# Patient Record
Sex: Male | Born: 1990 | Race: White | Hispanic: No | Marital: Single | Smoking: Former smoker
Health system: Southern US, Community
[De-identification: ages and names within clinical notes are randomized; demographics above are authoritative.]

## PROBLEM LIST (undated history)

## (undated) ENCOUNTER — Emergency Department (HOSPITAL_COMMUNITY): Admission: EM | Payer: 59 | Source: Home / Self Care

## (undated) ENCOUNTER — Emergency Department (HOSPITAL_COMMUNITY): Admission: EM | Payer: 59

## (undated) DIAGNOSIS — B958 Unspecified staphylococcus as the cause of diseases classified elsewhere: Secondary | ICD-10-CM

## (undated) DIAGNOSIS — I82409 Acute embolism and thrombosis of unspecified deep veins of unspecified lower extremity: Secondary | ICD-10-CM

## (undated) HISTORY — PX: MOUTH SURGERY: SHX715

## (undated) HISTORY — DX: Acute embolism and thrombosis of unspecified deep veins of unspecified lower extremity: I82.409

## (undated) HISTORY — PX: HAND SURGERY: SHX662

## (undated) HISTORY — PX: MR LOWER LEG LEFT (ARMC HX): HXRAD1784

## (undated) HISTORY — PX: FOOT AMPUTATION THROUGH ANKLE: SHX643

---
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
## 2012-11-16 ENCOUNTER — Other Ambulatory Visit: Payer: Self-pay

## 2012-11-16 ENCOUNTER — Emergency Department (HOSPITAL_COMMUNITY): Payer: 59

## 2012-11-16 ENCOUNTER — Encounter (HOSPITAL_COMMUNITY): Payer: Self-pay | Admitting: *Deleted

## 2012-11-16 ENCOUNTER — Emergency Department (HOSPITAL_COMMUNITY)
Admission: EM | Admit: 2012-11-16 | Discharge: 2012-11-16 | Disposition: A | Discharge status: Home / Self Care | Payer: 59 | Attending: Emergency Medicine | Admitting: Emergency Medicine

## 2012-11-16 DIAGNOSIS — R51 Headache: Secondary | ICD-10-CM | POA: Insufficient documentation

## 2012-11-16 DIAGNOSIS — Z8619 Personal history of other infectious and parasitic diseases: Secondary | ICD-10-CM | POA: Insufficient documentation

## 2012-11-16 DIAGNOSIS — R6883 Chills (without fever): Secondary | ICD-10-CM | POA: Insufficient documentation

## 2012-11-16 DIAGNOSIS — R109 Unspecified abdominal pain: Secondary | ICD-10-CM | POA: Insufficient documentation

## 2012-11-16 DIAGNOSIS — F17200 Nicotine dependence, unspecified, uncomplicated: Secondary | ICD-10-CM | POA: Insufficient documentation

## 2012-11-16 DIAGNOSIS — R079 Chest pain, unspecified: Secondary | ICD-10-CM | POA: Insufficient documentation

## 2012-11-16 HISTORY — DX: Unspecified staphylococcus as the cause of diseases classified elsewhere: B95.8

## 2012-11-16 NOTE — ED Notes (Addendum)
Pt c/o headache all day. Pt then says his chest started feeling funny (unable to explain feeling) around 8:30 pm. Pt states he had a staph infection 2 wks ago and that's when his headaches started. Pt also c/o left upper abdominal pain.

## 2012-11-16 NOTE — ED Notes (Signed)
Dr Juleen China in room assessing pt at this time

## 2012-11-16 NOTE — ED Notes (Signed)
Pt states he had a staph infection on his right arm, was given antibiotics but states he stopped taking them when the infection cleared and did not finish the whole coarse. Also notes headache and a "funny feeling in his chest" EKG done.

## 2012-11-16 NOTE — ED Notes (Signed)
Pt given a glass of water, informed pt we are waiting on chest x ray results.

## 2012-11-16 NOTE — ED Provider Notes (Signed)
History  This chart was scribed for Raeford Razor, MD by Shari Heritage, ED Scribe. The patient was seen in room APA04/APA04. Patient's care was started at 2200.   CSN: [id number]  Arrival date & time 11/16/12  2054   First MD Initiated Contact with Patient 11/16/12 2200      Chief Complaint  Patient presents with  . Chest Pain  . Headache     Patient is a 22 y.o. male presenting with headaches. The history is provided by the patient. No language interpreter was used.  Headache Pain location:  Generalized Quality:  Dull Radiates to:  Does not radiate Timing:  Unable to specify Progression:  Improving Relieved by:  Nothing Worsened by:  Nothing tried Ineffective treatments:  None tried Associated symptoms: abdominal pain   Associated symptoms: no diarrhea, no dizziness, no fever, no nausea, no numbness, no photophobia and no vomiting      HPI Comments: [name] is a 22 y.o. male who presents to the Emergency Department complaining of improving, constant, dull, diffuse headache since waking up this morning. Patient is also complaining of an unusual sensation in his chest. He states that his chest started to "feel funny" at about 8:30 pm, but he has difficulty characterizing and describing the sensation. There is associated chills. Patient denies fever or shortness of breath. There is no photophobia, numbness, weakness, nausea, visual changes or dizziness. Patient was diagnosed with a staph infection 2 weeks ago and says that he has been having headaches intermittently since then. He reports no other pertinent past medical history.  Past Medical History  Diagnosis Date  . Staph infection     Past Surgical History  Procedure Laterality Date  . Hand surgery      History reviewed. No pertinent family history.  History  Substance Use Topics  . Smoking status: Current Every Day Smoker  . Smokeless tobacco: Current User    Types: Chew  . Alcohol Use: No      Review  of Systems  Constitutional: Positive for chills. Negative for fever.  Eyes: Negative for photophobia and visual disturbance.  Respiratory: Negative for shortness of breath.   Gastrointestinal: Positive for abdominal pain. Negative for nausea, vomiting and diarrhea.  Neurological: Positive for headaches. Negative for dizziness, weakness and numbness.  All other systems reviewed and are negative.    Allergies  Review of patient's allergies indicates no known allergies.  Home Medications  No current outpatient prescriptions on file.  Triage Vitals: BP 125/72  Temp(Src) 98.1 F (36.7 C) (Oral)  Resp 20  Ht 6' (1.829 m)  Wt 180 lb (81.647 kg)  BMI 24.41 kg/m2  SpO2 100%  Physical Exam  Nursing note and vitals reviewed. Constitutional: He appears well-developed and well-nourished. No distress.  HENT:  Head: Normocephalic and atraumatic.  Eyes: Conjunctivae are normal. Right eye exhibits no discharge. Left eye exhibits no discharge.  Neck: Neck supple.  Cardiovascular: Normal rate, regular rhythm and normal heart sounds.  Exam reveals no gallop and no friction rub.   No murmur heard. Pulmonary/Chest: Effort normal and breath sounds normal. No respiratory distress.  Abdominal: Soft. He exhibits no distension. There is no tenderness.  Musculoskeletal: He exhibits no edema and no tenderness.  Lower extremities symmetric as compared to each other. No calf tenderness. Negative Homan's. No palpable cords.   Neurological: He is alert.  Skin: Skin is warm and dry.  Psychiatric: He has a normal mood and affect. His behavior is normal. Thought content normal.  ED Course  Procedures (including critical care time) DIAGNOSTIC STUDIES: Oxygen Saturation is 100% on room air, normal by my interpretation.    COORDINATION OF CARE: 10:25 PM- Patient informed of current plan for treatment and evaluation and agrees with plan at this time.     Labs Reviewed - No data to display No results  found.  Dg Chest 2 View  11/16/2012  *RADIOLOGY REPORT*  Clinical Data: Headache, chills, chest pain, abdominal pain, chest discomfort  CHEST - 2 VIEW  Comparison: None  Findings: Normal heart size, mediastinal contours, and pulmonary vascularity. Lungs clear. Bones unremarkable. No pneumothorax.  IMPRESSION: Normal exam.   Original Report Authenticated By: Ulyses Southward, M.D.    EKG:  Rhythm: normal sinus Vent. rate 66 BPM PR interval 176 ms QRS duration 108 ms QT/QTc 420/440 ms Non specific intraventricular delay ST segments: normal   1. Chest pain       MDM  21yM with CP. Doubt ACS, infectious, PE, dissection or other emergent pathology.       I personally preformed the services scribed in my presence. The recorded information has been reviewed is accurate. Raeford Razor, MD.    Raeford Razor, MD 11/20/12 1910

## 2012-12-02 ENCOUNTER — Encounter: Payer: Self-pay | Admitting: Family Medicine

## 2012-12-02 ENCOUNTER — Ambulatory Visit (INDEPENDENT_AMBULATORY_CARE_PROVIDER_SITE_OTHER): Payer: 59 | Admitting: Family Medicine

## 2012-12-02 VITALS — Temp 98.1°F | Wt 165.6 lb

## 2012-12-02 DIAGNOSIS — J309 Allergic rhinitis, unspecified: Secondary | ICD-10-CM

## 2012-12-02 MED ORDER — AMOXICILLIN-POT CLAVULANATE 875-125 MG PO TABS: 1.0000 | ORAL_TABLET | Freq: Two times a day (BID) | ORAL | Status: DC

## 2012-12-02 MED ORDER — LORATADINE 10 MG PO TABS: 10.0000 mg | ORAL_TABLET | Freq: Every day | ORAL | Status: DC

## 2012-12-02 MED ORDER — FLUTICASONE PROPIONATE 50 MCG/ACT NA SUSP: 2.0000 | Freq: Every day | NASAL | Status: DC

## 2012-12-02 NOTE — Patient Instructions (Signed)
Allergic Rhinitis  Allergic rhinitis is when the mucous membranes in the nose respond to allergens. Allergens are particles in the air that cause your body to have an allergic reaction. This causes you to release allergic antibodies. Through a chain of events, these eventually cause you to release histamine into the blood stream (hence the use of antihistamines). Although meant to be protective to the body, it is this release that causes your discomfort, such as frequent sneezing, congestion and an itchy runny nose.    CAUSES    The pollen allergens may come from grasses, trees, and weeds. This is seasonal allergic rhinitis, or "hay fever." Other allergens cause year-round allergic rhinitis (perennial allergic rhinitis) such as house dust mite allergen, pet dander and mold spores.    SYMPTOMS     Nasal stuffiness (congestion).   Runny, itchy nose with sneezing and tearing of the eyes.   There is often an itching of the mouth, eyes and ears.  It cannot be cured, but it can be controlled with medications.  DIAGNOSIS    If you are unable to determine the offending allergen, skin or blood testing may find it.  TREATMENT     Avoid the allergen.   Medications and allergy shots (immunotherapy) can help.   Hay fever may often be treated with antihistamines in pill or nasal spray forms. Antihistamines block the effects of histamine. There are over-the-counter medicines that may help with nasal congestion and swelling around the eyes. Check with your caregiver before taking or giving this medicine.  If the treatment above does not work, there are many new medications your caregiver can prescribe. Stronger medications may be used if initial measures are ineffective. Desensitizing injections can be used if medications and avoidance fails. Desensitization is when a patient is given ongoing shots until the body becomes less sensitive to the allergen. Make sure you follow up with your caregiver if problems continue.   SEEK MEDICAL CARE IF:     You develop fever (more than 100.5 F (38.1 C).   You develop a cough that does not stop easily (persistent).   You have shortness of breath.   You start wheezing.   Symptoms interfere with normal daily activities.  Document Released: 05/02/2001 Document Revised: 10/30/2011 Document Reviewed: 11/11/2008  [employer] Patient Information 2013 [employer].

## 2012-12-02 NOTE — Progress Notes (Signed)
  Subjective:    Patient ID: [name], male    DOB: Apr 02, 1991, 22 y.o.   MRN: [id number]  Sore Throat  This is a recurrent problem. The current episode started in the past 7 days. The problem has been gradually worsening. Neither side of throat is experiencing more pain than the other. There has been no fever. The fever has been present for less than 1 day. The pain is at a severity of 2/10. The patient is experiencing no pain. Associated symptoms include congestion, coughing and a hoarse voice. Pertinent negatives include no abdominal pain, ear pain, headaches, neck pain, shortness of breath or stridor.      Review of Systems  Constitutional: Positive for fatigue.  HENT: Positive for congestion and hoarse voice. Negative for ear pain and neck pain.   Respiratory: Positive for cough. Negative for shortness of breath and stridor.   Cardiovascular: Negative for chest pain.  Gastrointestinal: Negative for abdominal pain.  Neurological: Negative for headaches.       Objective:   Physical Exam  Constitutional: He appears well-developed and well-nourished.  HENT:  Head: Normocephalic and atraumatic.  Right Ear: External ear normal.  Left Ear: External ear normal.  Neck: Normal range of motion. Neck supple. No thyromegaly present.  Cardiovascular: Normal rate, regular rhythm and normal heart sounds.   No murmur heard. Pulmonary/Chest: Effort normal and breath sounds normal. No respiratory distress. He has no wheezes.  Abdominal: Soft.  Lymphadenopathy:    He has no cervical adenopathy.          Assessment & Plan:  Allergic rhinitis he has been scribed loratadine one every day also Flonase 2 sprays each nostril daily for the next several months. He will followup here when necessary. I also prescribed Augmentin 875 one twice a day for 10 days for sinus infection. He is to followup if progressive troubles.

## 2012-12-30 ENCOUNTER — Ambulatory Visit: Payer: 59 | Admitting: Family Medicine

## 2013-01-09 ENCOUNTER — Telehealth: Payer: Self-pay | Admitting: Family Medicine

## 2013-01-09 NOTE — Telephone Encounter (Signed)
PT has been several days without a BM, feeling really constipated. Can we call in something or what would you recommend? [facility] but verify with Marcelino Duster the contact who called first.

## 2013-01-09 NOTE — Telephone Encounter (Signed)
Left message on pt mother vm to use miralax 1 capful with 8oz use now then again in 2 hrs, repeat 2 hrs later again.  Use with senokot tablet, call in the am for app.

## 2013-01-17 ENCOUNTER — Encounter: Payer: Self-pay | Admitting: *Deleted

## 2013-01-22 ENCOUNTER — Encounter: Payer: Self-pay | Admitting: *Deleted

## 2013-01-28 ENCOUNTER — Ambulatory Visit (INDEPENDENT_AMBULATORY_CARE_PROVIDER_SITE_OTHER): Payer: 59 | Admitting: Family Medicine

## 2013-01-28 ENCOUNTER — Encounter: Payer: Self-pay | Admitting: Family Medicine

## 2013-01-28 VITALS — BP 120/88 | Temp 98.2°F | Wt 159.8 lb

## 2013-01-28 DIAGNOSIS — R109 Unspecified abdominal pain: Secondary | ICD-10-CM

## 2013-01-28 NOTE — Progress Notes (Signed)
  Subjective:    Patient ID: [name], male    DOB: Jan 08, 1991, 22 y.o.   MRN: [id number]  Flank Pain This is a new problem. The current episode started more than 1 month ago. The problem occurs every several days. The problem is unchanged. The patient is experiencing no pain. He has tried nothing for the symptoms.      Review of Systems  Genitourinary: Positive for flank pain.  Patient relates a the pain discomfort is in the anterior aspect of the abdomen on the right side had been off and on for several days now doing much better he denies any fever sweats chills vomiting diarrhea states when he eats he does not getting problems does not hurt with any particular type of movement never had this before. Family history benign. Social he does use tobacco to chew but does not smoke he's been counseled to quit     Objective:   Physical Exam  Neck no masses lungs are clear no crackles heart is regular no murmurs abdomen soft no guarding rebound tenderness skin warm dry      Assessment & Plan:  Abdominal pain-I. Don't find any evidence of any type of ongoing trouble about recommend x-rays or lab work currently if he has reoccurrence may try to omeprazole for a two-week trial I did tell him if he starts having severe vomiting fevers chills or other problems he needs followup 15 minutes spent with patient. [id number]. I do not feel that this patient is having any type of gallbladder disease or kidney stones.

## 2013-01-28 NOTE — Patient Instructions (Addendum)
If pain returns then try omeprazole 20 mg one daily for 2 weeks.   If worse- fevers, vomiting or other issues call    Tylenol for rib pain when needed but no greater than 4 per day.

## 2013-02-05 ENCOUNTER — Encounter: Payer: Self-pay | Admitting: Family Medicine

## 2013-02-05 ENCOUNTER — Ambulatory Visit (INDEPENDENT_AMBULATORY_CARE_PROVIDER_SITE_OTHER): Payer: 59 | Admitting: Family Medicine

## 2013-02-05 VITALS — Temp 98.2°F | Wt 159.4 lb

## 2013-02-05 DIAGNOSIS — R1013 Epigastric pain: Secondary | ICD-10-CM

## 2013-02-05 DIAGNOSIS — R109 Unspecified abdominal pain: Secondary | ICD-10-CM | POA: Insufficient documentation

## 2013-02-05 LAB — CBC WITH DIFFERENTIAL/PLATELET
Basophils Absolute: 0 10*3/uL (ref 0.0–0.1)
Basophils Relative: 1 % (ref 0–1)
Eosinophils Absolute: 0.2 10*3/uL (ref 0.0–0.7)
Eosinophils Relative: 3 % (ref 0–5)
HCT: 45.5 % (ref 39.0–52.0)
Hemoglobin: 15.6 g/dL (ref 13.0–17.0)
Lymphocytes Relative: 34 % (ref 12–46)
Lymphs Abs: 2.2 10*3/uL (ref 0.7–4.0)
MCH: 29.7 pg (ref 26.0–34.0)
MCHC: 34.3 g/dL (ref 30.0–36.0)
MCV: 86.7 fL (ref 78.0–100.0)
Monocytes Absolute: 0.4 10*3/uL (ref 0.1–1.0)
Monocytes Relative: 5 % (ref 3–12)
Neutro Abs: 3.8 10*3/uL (ref 1.7–7.7)
Neutrophils Relative %: 57 % (ref 43–77)
Platelets: 175 10*3/uL (ref 150–400)
RBC: 5.25 MIL/uL (ref 4.22–5.81)
RDW: 13.4 % (ref 11.5–15.5)
WBC: 6.6 10*3/uL (ref 4.0–10.5)

## 2013-02-05 MED ORDER — PANTOPRAZOLE SODIUM 40 MG PO TBEC: 40.0000 mg | DELAYED_RELEASE_TABLET | Freq: Every day | ORAL | Status: DC

## 2013-02-05 NOTE — Patient Instructions (Addendum)
  Use protonix one daily for next 3 months, may use Maalox as needed Do your labs, we will let you know results Recheck here in 3 weeks,if not better over next 10 days call The next step would be a abdominal ultrasound and GI referral    Gastritis, Adult Gastritis is soreness and swelling (inflammation) of the lining of the stomach. Gastritis can develop as a sudden onset (acute) or long-term (chronic) condition. If gastritis is not treated, it can lead to stomach bleeding and ulcers. CAUSES  Gastritis occurs when the stomach lining is weak or damaged. Digestive juices from the stomach then inflame the weakened stomach lining. The stomach lining may be weak or damaged due to viral or bacterial infections. One common bacterial infection is the Helicobacter pylori infection. Gastritis can also result from excessive alcohol consumption, taking certain medicines, or having too much acid in the stomach.  SYMPTOMS  In some cases, there are no symptoms. When symptoms are present, they may include:  Pain or a burning sensation in the upper abdomen.  Nausea.  Vomiting.  An uncomfortable feeling of fullness after eating. DIAGNOSIS  Your caregiver may suspect you have gastritis based on your symptoms and a physical exam. To determine the cause of your gastritis, your caregiver may perform the following:  Blood or stool tests to check for the H pylori bacterium.  Gastroscopy. A thin, flexible tube (endoscope) is passed down the esophagus and into the stomach. The endoscope has a light and camera on the end. Your caregiver uses the endoscope to view the inside of the stomach.  Taking a tissue sample (biopsy) from the stomach to examine under a microscope. TREATMENT  Depending on the cause of your gastritis, medicines may be prescribed. If you have a bacterial infection, such as an H pylori infection, antibiotics may be given. If your gastritis is caused by too much acid in the stomach, H2 blockers or  antacids may be given. Your caregiver may recommend that you stop taking aspirin, ibuprofen, or other nonsteroidal anti-inflammatory drugs (NSAIDs). HOME CARE INSTRUCTIONS  Only take over-the-counter or prescription medicines as directed by your caregiver.  If you were given antibiotic medicines, take them as directed. Finish them even if you start to feel better.  Drink enough fluids to keep your urine clear or pale yellow.  Avoid foods and drinks that make your symptoms worse, such as:  Caffeine or alcoholic drinks.  Chocolate.  Peppermint or mint flavorings.  Garlic and onions.  Spicy foods.  Citrus fruits, such as oranges, lemons, or limes.  Tomato-based foods such as sauce, chili, salsa, and pizza.  Fried and fatty foods.  Eat small, frequent meals instead of large meals. SEEK IMMEDIATE MEDICAL CARE IF:   You have black or dark red stools.  You vomit blood or material that looks like coffee grounds.  You are unable to keep fluids down.  Your abdominal pain gets worse.  You have a fever.  You do not feel better after 1 week.  You have any other questions or concerns. MAKE SURE YOU:  Understand these instructions.  Will watch your condition.  Will get help right away if you are not doing well or get worse. Document Released: 08/01/2001 Document Revised: 02/06/2012 Document Reviewed: 09/20/2011 [facility] Patient Information 2014 [place], [place].

## 2013-02-05 NOTE — Progress Notes (Signed)
  Subjective:    Patient ID: [name], male    DOB: 1991-01-28, 22 y.o.   MRN: [id number]  Abdominal Pain This is a recurrent problem. The current episode started more than 1 month ago. The problem has been unchanged.   Was hurting on r side now both sides. Trying to eat healthy No vomiting No diarrhea. No prior histoiry other than the past month   Review of Systems  Gastrointestinal: Positive for abdominal pain.  no sweats, felt hot with the pain, happens every other day, woke pt last night.  Family history noncontributory social-doesn't smoke    Objective:   Physical Exam  Lungs clear hearts regular abdomen soft no guarding or rebound he points to the epigastric area where he had the pain and discomfort. Flanks nontender extremities no edema skin warm dry neurologic grossly      Assessment & Plan:  Abdominal pain-lab work ordered protonic daily if not doing better over the next 10-14 days recommend ultrasound and GI referral. Followup in approximately one month sooner if any problems.

## 2013-02-06 ENCOUNTER — Telehealth: Payer: Self-pay | Admitting: Family Medicine

## 2013-02-06 LAB — HEPATIC FUNCTION PANEL
ALT: <8 U/L (ref 0–53)
AST: 12 U/L (ref 0–37)
Albumin: 4.6 g/dL (ref 3.5–5.2)
Alkaline Phosphatase: 58 U/L (ref 39–117)
Bilirubin, Direct: 0.2 mg/dL (ref 0.0–0.3)
Indirect Bilirubin: 0.5 mg/dL (ref 0.0–0.9)
Total Bilirubin: 0.7 mg/dL (ref 0.3–1.2)
Total Protein: 7.2 g/dL (ref 6.0–8.3)

## 2013-02-06 LAB — BASIC METABOLIC PANEL
BUN: 12 mg/dL (ref 6–23)
CO2: 26 mEq/L (ref 19–32)
Calcium: 9.3 mg/dL (ref 8.4–10.5)
Chloride: 102 mEq/L (ref 96–112)
Creat: 1.09 mg/dL (ref 0.50–1.35)
Glucose, Bld: 90 mg/dL (ref 70–99)
Potassium: 4.3 mEq/L (ref 3.5–5.3)
Sodium: 138 mEq/L (ref 135–145)

## 2013-02-06 LAB — LIPASE: Lipase: 14 U/L (ref 0–75)

## 2013-02-06 LAB — H. PYLORI ANTIBODY, IGG: H Pylori IgG: 0.57 {ISR}

## 2013-02-06 NOTE — Telephone Encounter (Signed)
Patient is calling to check on BW results

## 2013-02-07 NOTE — Telephone Encounter (Signed)
See results note. 

## 2013-02-07 NOTE — Telephone Encounter (Signed)
Patient was notified of results.  

## 2013-07-11 ENCOUNTER — Ambulatory Visit (INDEPENDENT_AMBULATORY_CARE_PROVIDER_SITE_OTHER): Payer: 59 | Admitting: *Deleted

## 2013-07-11 DIAGNOSIS — Z23 Encounter for immunization: Secondary | ICD-10-CM

## 2013-09-09 ENCOUNTER — Encounter: Payer: Self-pay | Admitting: Family Medicine

## 2013-09-09 ENCOUNTER — Ambulatory Visit (INDEPENDENT_AMBULATORY_CARE_PROVIDER_SITE_OTHER): Payer: 59 | Admitting: Family Medicine

## 2013-09-09 VITALS — BP 120/80 | Temp 98.2°F | Ht 72.0 in | Wt 157.5 lb

## 2013-09-09 DIAGNOSIS — J029 Acute pharyngitis, unspecified: Secondary | ICD-10-CM

## 2013-09-09 LAB — POCT RAPID STREP A (OFFICE): Rapid Strep A Screen: POSITIVE — AB

## 2013-09-09 MED ORDER — AMOXICILLIN 500 MG PO TABS: 500.0000 mg | ORAL_TABLET | Freq: Three times a day (TID) | ORAL | Status: DC

## 2013-09-09 MED ORDER — MUPIROCIN 2 % EX OINT: TOPICAL_OINTMENT | CUTANEOUS | Status: AC

## 2013-09-09 NOTE — Progress Notes (Signed)
   Subjective:    Patient ID: Verna CzechHarold A Patchell, male    DOB: 09/29/1990, 23 y.o.   MRN: [id number]  Sore Throat  This is a new problem. The current episode started yesterday. The problem has been unchanged. Neither side of throat is experiencing more pain than the other. There has been no fever. The pain is moderate. Pertinent negatives include no congestion, coughing or ear pain. Associated symptoms comments: nausea. Treatments tried: dayquil. The treatment provided moderate relief.   Started yesterday, worse last night, this am still present. Felt nausea today, did not eat, ate lunch but had nausea.  PMH benign  Review of Systems  Constitutional: Negative for fever and activity change.  HENT: Positive for sore throat. Negative for congestion, ear pain and rhinorrhea.   Eyes: Negative for discharge.  Respiratory: Negative for cough and wheezing.   Cardiovascular: Negative for chest pain.       Objective:   Physical Exam  Nursing note and vitals reviewed. Constitutional: He appears well-developed.  HENT:  Head: Normocephalic.  Mouth/Throat: Oropharynx is clear and moist. No oropharyngeal exudate.  Neck: Normal range of motion.  Cardiovascular: Normal rate, regular rhythm and normal heart sounds.   No murmur heard. Pulmonary/Chest: Effort normal and breath sounds normal. He has no wheezes.  Lymphadenopathy:    He has no cervical adenopathy.  Neurological: He exhibits normal muscle tone.  Skin: Skin is warm and dry.          Assessment & Plan:  Strep throat amoxicillin prescribed warning signs discussed followup if progressive troubles

## 2014-03-17 ENCOUNTER — Ambulatory Visit (INDEPENDENT_AMBULATORY_CARE_PROVIDER_SITE_OTHER): Payer: 59 | Admitting: Family Medicine

## 2014-03-17 ENCOUNTER — Encounter: Payer: Self-pay | Admitting: Family Medicine

## 2014-03-17 VITALS — Ht 72.0 in | Wt 172.6 lb

## 2014-03-17 DIAGNOSIS — L0291 Cutaneous abscess, unspecified: Secondary | ICD-10-CM

## 2014-03-17 DIAGNOSIS — L0390 Cellulitis, unspecified: Principal | ICD-10-CM

## 2014-03-17 MED ORDER — DOXYCYCLINE HYCLATE 100 MG PO TABS: 100.0000 mg | ORAL_TABLET | Freq: Two times a day (BID) | ORAL | Status: DC

## 2014-03-17 MED ORDER — MUPIROCIN 2 % EX OINT: TOPICAL_OINTMENT | CUTANEOUS | Status: DC

## 2014-03-17 NOTE — Progress Notes (Signed)
   Subjective:    Patient ID: [name], male    DOB: 12/27/1990, 23 y.o.   MRN: [id number]  HPI  Patient arrives with infected area on right arm near elbow.  Area is draining and red.  No fever or chills.  Interestingly brother had a "spider bite" that caused a skin infection and ulceration in recent months.  No history of prior MRSA rash et [name].  Review of Systems No headache no chest pain no back pain ROS otherwise negative    Objective:   Physical Exam Alert no apparent distress. Lungs clear heart rare rhythm. Right lateral elbow exudative ulceration with surrounding erythema       Assessment & Plan:  Impression secondarily infected wound with now ulceration and element of cellulitis plan wound management discussed. Doxy twice a day 10 days. Bactroban locally. Culture wound. WSL

## 2014-03-21 LAB — WOUND CULTURE
Gram Stain: NONE SEEN
Gram Stain: NONE SEEN
Gram Stain: NONE SEEN

## 2014-04-10 ENCOUNTER — Encounter: Payer: Self-pay | Admitting: Family Medicine

## 2014-04-10 ENCOUNTER — Ambulatory Visit (INDEPENDENT_AMBULATORY_CARE_PROVIDER_SITE_OTHER): Payer: 59 | Admitting: Family Medicine

## 2014-04-10 VITALS — BP 138/92 | Temp 98.9°F | Ht 72.0 in | Wt 171.0 lb

## 2014-04-10 DIAGNOSIS — R21 Rash and other nonspecific skin eruption: Secondary | ICD-10-CM

## 2014-04-10 MED ORDER — TRIAMCINOLONE ACETONIDE 0.1 % EX CREA: 1.0000 "application " | TOPICAL_CREAM | Freq: Two times a day (BID) | CUTANEOUS | Status: DC

## 2014-04-10 NOTE — Progress Notes (Signed)
   Subjective:    Patient ID: Brian CzechHarold A Traynham, male    DOB: 02/14/1991, 23 y.o.   MRN: [id number]  HPI Rash on left hand and arm. Was outside in the sun while taking doxy for abscess on right arm. Abscess on right arm is not completely healed. Has finished doxy and bactroban.    Hands irritated and uncomfortable. Took all his antibiotics up.  Got out of the sun. Within a couple hours and developed blistering and intent sunburn on hands. Review of Systems No headache no fever no chills no abdominal pain ROS otherwise negative    Objective:   Physical Exam  Alert vitals stable. Lungs clear heart regular in rhythm. Right lateral elbow superficial abscess healing reasonably well. Hands impressive erythema blistering      Assessment & Plan:  Impression phototoxic dermatitis secondary to sun plus Doxy discussed plan appropriate care prescribed. Symptomatic care discussed. WSL

## 2014-04-25 ENCOUNTER — Encounter (HOSPITAL_COMMUNITY): Payer: Self-pay | Admitting: Emergency Medicine

## 2014-04-25 ENCOUNTER — Emergency Department (HOSPITAL_COMMUNITY)
Admission: EM | Admit: 2014-04-25 | Discharge: 2014-04-26 | Disposition: A | Discharge status: Home / Self Care | Payer: 59 | Attending: Emergency Medicine | Admitting: Emergency Medicine

## 2014-04-25 ENCOUNTER — Emergency Department (HOSPITAL_COMMUNITY): Payer: 59

## 2014-04-25 DIAGNOSIS — Z87891 Personal history of nicotine dependence: Secondary | ICD-10-CM | POA: Insufficient documentation

## 2014-04-25 DIAGNOSIS — S02400A Malar fracture unspecified, initial encounter for closed fracture: Secondary | ICD-10-CM | POA: Insufficient documentation

## 2014-04-25 DIAGNOSIS — S02401B Maxillary fracture, unspecified, initial encounter for open fracture: Secondary | ICD-10-CM

## 2014-04-25 DIAGNOSIS — S62339A Displaced fracture of neck of unspecified metacarpal bone, initial encounter for closed fracture: Secondary | ICD-10-CM | POA: Insufficient documentation

## 2014-04-25 DIAGNOSIS — Y9289 Other specified places as the place of occurrence of the external cause: Secondary | ICD-10-CM | POA: Insufficient documentation

## 2014-04-25 DIAGNOSIS — IMO0002 Reserved for concepts with insufficient information to code with codable children: Secondary | ICD-10-CM | POA: Diagnosis not present

## 2014-04-25 DIAGNOSIS — Z9889 Other specified postprocedural states: Secondary | ICD-10-CM | POA: Insufficient documentation

## 2014-04-25 DIAGNOSIS — Y9389 Activity, other specified: Secondary | ICD-10-CM | POA: Diagnosis not present

## 2014-04-25 DIAGNOSIS — S199XXA Unspecified injury of neck, initial encounter: Secondary | ICD-10-CM | POA: Diagnosis present

## 2014-04-25 DIAGNOSIS — S02401A Maxillary fracture, unspecified, initial encounter for closed fracture: Secondary | ICD-10-CM | POA: Diagnosis not present

## 2014-04-25 DIAGNOSIS — S12600A Unspecified displaced fracture of seventh cervical vertebra, initial encounter for closed fracture: Secondary | ICD-10-CM | POA: Diagnosis present

## 2014-04-25 DIAGNOSIS — T07XXXA Unspecified multiple injuries, initial encounter: Secondary | ICD-10-CM | POA: Diagnosis present

## 2014-04-25 DIAGNOSIS — S62337A Displaced fracture of neck of fifth metacarpal bone, left hand, initial encounter for closed fracture: Secondary | ICD-10-CM | POA: Diagnosis present

## 2014-04-25 DIAGNOSIS — S0993XA Unspecified injury of face, initial encounter: Secondary | ICD-10-CM | POA: Diagnosis present

## 2014-04-25 DIAGNOSIS — E860 Dehydration: Secondary | ICD-10-CM | POA: Diagnosis not present

## 2014-04-25 LAB — COMPREHENSIVE METABOLIC PANEL
ALT: 17 U/L (ref 0–53)
AST: 32 U/L (ref 0–37)
Albumin: 4.2 g/dL (ref 3.5–5.2)
Alkaline Phosphatase: 70 U/L (ref 39–117)
Anion gap: 18 — ABNORMAL HIGH (ref 5–15)
BUN: 10 mg/dL (ref 6–23)
CO2: 20 mEq/L (ref 19–32)
Calcium: 8.6 mg/dL (ref 8.4–10.5)
Chloride: 92 mEq/L — ABNORMAL LOW (ref 96–112)
Creatinine, Ser: 0.88 mg/dL (ref 0.50–1.35)
GFR calc Af Amer: >90 mL/min (ref >90)
GFR calc non Af Amer: >90 mL/min (ref >90)
Glucose, Bld: 101 mg/dL — ABNORMAL HIGH (ref 70–99)
Potassium: 3.6 mEq/L — ABNORMAL LOW (ref 3.7–5.3)
Sodium: 130 mEq/L — ABNORMAL LOW (ref 137–147)
Total Bilirubin: 0.4 mg/dL (ref 0.3–1.2)
Total Protein: 7.6 g/dL (ref 6.0–8.3)

## 2014-04-25 LAB — CBC
HCT: 47.9 % (ref 39.0–52.0)
Hemoglobin: 17.6 g/dL — ABNORMAL HIGH (ref 13.0–17.0)
MCH: 32.7 pg (ref 26.0–34.0)
MCHC: 36.7 g/dL — ABNORMAL HIGH (ref 30.0–36.0)
MCV: 89 fL (ref 78.0–100.0)
Platelets: 199 10*3/uL (ref 150–400)
RBC: 5.38 MIL/uL (ref 4.22–5.81)
RDW: 12.4 % (ref 11.5–15.5)
WBC: 11.7 10*3/uL — ABNORMAL HIGH (ref 4.0–10.5)

## 2014-04-25 LAB — PROTIME-INR
INR: 0.95 (ref 0.00–1.49)
Prothrombin Time: 12.7 seconds (ref 11.6–15.2)

## 2014-04-25 LAB — ETHANOL: Alcohol, Ethyl (B): 298 mg/dL — ABNORMAL HIGH (ref 0–11)

## 2014-04-25 MED ORDER — SODIUM CHLORIDE 0.9 % IV SOLN
INTRAVENOUS | Status: DC | PRN
  Administered 2014-04-25: 999 mL/h via INTRAVENOUS

## 2014-04-25 MED ORDER — IOHEXOL 300 MG/ML  SOLN
100.0000 mL | Freq: Once | INTRAMUSCULAR | Status: AC | PRN
  Administered 2014-04-25: 100 mL via INTRAVENOUS

## 2014-04-25 MED ORDER — HALOPERIDOL LACTATE 5 MG/ML IJ SOLN
10.0000 mg | Freq: Once | INTRAMUSCULAR | Status: AC
  Administered 2014-04-25: 10 mg via INTRAVENOUS
  Filled 2014-04-25: qty 2
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
## 2014-04-25 NOTE — ED Notes (Addendum)
Pt. Uncooperative to have CT studies done; Dr. Fonnie Jarvis and Resident aware; pt. Refuses to have any diagnostics tests done - more less, wants to leave facility; begins removing monitoring equipment; family at bedside witnessing events; Resident now at bedside to inform pt. About the importance of diagnostics and medical management to pt. And family. Due to ETOH, being uncooperative, belligerent, and poor judgment pt. Was told that he could not refuse and we would have to give Haldol; family at bedside and agrees with MD's medical decision for [place] to give the Haldol to ensure we get the needed tests and to provide the necessary medical management that is safe.
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
## 2014-04-25 NOTE — ED Notes (Addendum)
Grandparents at beside. Family given emotional support. Family made aware that pt. Refuses pain medication.

## 2014-04-26 ENCOUNTER — Encounter (HOSPITAL_COMMUNITY): Payer: Self-pay | Admitting: Emergency Medicine

## 2014-04-26 ENCOUNTER — Emergency Department (HOSPITAL_COMMUNITY)
Admission: EM | Admit: 2014-04-26 | Discharge: 2014-04-26 | Disposition: A | Discharge status: Home / Self Care | Payer: 59 | Attending: Emergency Medicine | Admitting: Emergency Medicine

## 2014-04-26 DIAGNOSIS — Z87828 Personal history of other (healed) physical injury and trauma: Secondary | ICD-10-CM | POA: Insufficient documentation

## 2014-04-26 DIAGNOSIS — Z8619 Personal history of other infectious and parasitic diseases: Secondary | ICD-10-CM | POA: Diagnosis not present

## 2014-04-26 DIAGNOSIS — R509 Fever, unspecified: Secondary | ICD-10-CM | POA: Insufficient documentation

## 2014-04-26 DIAGNOSIS — S62337A Displaced fracture of neck of fifth metacarpal bone, left hand, initial encounter for closed fracture: Secondary | ICD-10-CM | POA: Diagnosis present

## 2014-04-26 DIAGNOSIS — E860 Dehydration: Secondary | ICD-10-CM | POA: Diagnosis not present

## 2014-04-26 DIAGNOSIS — R252 Cramp and spasm: Secondary | ICD-10-CM | POA: Insufficient documentation

## 2014-04-26 DIAGNOSIS — S12600A Unspecified displaced fracture of seventh cervical vertebra, initial encounter for closed fracture: Secondary | ICD-10-CM | POA: Diagnosis present

## 2014-04-26 DIAGNOSIS — T07XXXA Unspecified multiple injuries, initial encounter: Secondary | ICD-10-CM | POA: Diagnosis present

## 2014-04-26 DIAGNOSIS — Z87891 Personal history of nicotine dependence: Secondary | ICD-10-CM | POA: Insufficient documentation

## 2014-04-26 DIAGNOSIS — Z79899 Other long term (current) drug therapy: Secondary | ICD-10-CM | POA: Insufficient documentation

## 2014-04-26 DIAGNOSIS — S02401A Maxillary fracture, unspecified, initial encounter for closed fracture: Secondary | ICD-10-CM | POA: Diagnosis present

## 2014-04-26 LAB — COMPREHENSIVE METABOLIC PANEL
ALT: 17 U/L (ref 0–53)
AST: 27 U/L (ref 0–37)
Albumin: 5 g/dL (ref 3.5–5.2)
Alkaline Phosphatase: 85 U/L (ref 39–117)
Anion gap: 20 — ABNORMAL HIGH (ref 5–15)
BUN: 12 mg/dL (ref 6–23)
CO2: 22 mEq/L (ref 19–32)
Calcium: 9.9 mg/dL (ref 8.4–10.5)
Chloride: 100 mEq/L (ref 96–112)
Creatinine, Ser: 1.02 mg/dL (ref 0.50–1.35)
GFR calc Af Amer: >90 mL/min (ref >90)
GFR calc non Af Amer: >90 mL/min (ref >90)
Glucose, Bld: 124 mg/dL — ABNORMAL HIGH (ref 70–99)
Potassium: 4.4 mEq/L (ref 3.7–5.3)
Sodium: 142 mEq/L (ref 137–147)
Total Bilirubin: 1 mg/dL (ref 0.3–1.2)
Total Protein: 8.4 g/dL — ABNORMAL HIGH (ref 6.0–8.3)

## 2014-04-26 LAB — URINE MICROSCOPIC-ADD ON

## 2014-04-26 LAB — URINALYSIS, ROUTINE W REFLEX MICROSCOPIC
Glucose, UA: NEGATIVE mg/dL
Hgb urine dipstick: NEGATIVE
Ketones, ur: 15 mg/dL — AB
Leukocytes, UA: NEGATIVE
Nitrite: NEGATIVE
Protein, ur: 30 mg/dL — AB
Specific Gravity, Urine: 1.029 (ref 1.005–1.030)
Urobilinogen, UA: 0.2 mg/dL (ref 0.0–1.0)
pH: 5.5 (ref 5.0–8.0)

## 2014-04-26 LAB — CBC WITH DIFFERENTIAL/PLATELET
Basophils Absolute: 0 10*3/uL (ref 0.0–0.1)
Basophils Relative: 0 % (ref 0–1)
Eosinophils Absolute: 0.1 10*3/uL (ref 0.0–0.7)
Eosinophils Relative: 1 % (ref 0–5)
HCT: 53.2 % — ABNORMAL HIGH (ref 39.0–52.0)
Hemoglobin: 18.8 g/dL — ABNORMAL HIGH (ref 13.0–17.0)
Lymphocytes Relative: 11 % — ABNORMAL LOW (ref 12–46)
Lymphs Abs: 1.8 10*3/uL (ref 0.7–4.0)
MCH: 32.8 pg (ref 26.0–34.0)
MCHC: 35.3 g/dL (ref 30.0–36.0)
MCV: 92.8 fL (ref 78.0–100.0)
Monocytes Absolute: 1 10*3/uL (ref 0.1–1.0)
Monocytes Relative: 6 % (ref 3–12)
Neutro Abs: 13.3 10*3/uL — ABNORMAL HIGH (ref 1.7–7.7)
Neutrophils Relative %: 82 % — ABNORMAL HIGH (ref 43–77)
Platelets: 228 10*3/uL (ref 150–400)
RBC: 5.73 MIL/uL (ref 4.22–5.81)
RDW: 12.8 % (ref 11.5–15.5)
WBC: 16.3 10*3/uL — ABNORMAL HIGH (ref 4.0–10.5)

## 2014-04-26 MED ORDER — LORAZEPAM 2 MG/ML IJ SOLN
1.0000 mg | Freq: Once | INTRAMUSCULAR | Status: AC
  Administered 2014-04-26: 1 mg via INTRAVENOUS
  Filled 2014-04-26: qty 1

## 2014-04-26 MED ORDER — IBUPROFEN 800 MG PO TABS
800.0000 mg | ORAL_TABLET | Freq: Once | ORAL | Status: AC
  Administered 2014-04-26: 800 mg via ORAL
  Filled 2014-04-26: qty 2

## 2014-04-26 MED ORDER — SODIUM CHLORIDE 0.9 % IV BOLUS (SEPSIS)
1000.0000 mL | Freq: Once | INTRAVENOUS | Status: AC
  Administered 2014-04-26: 1000 mL via INTRAVENOUS

## 2014-04-26 MED ORDER — LORAZEPAM 1 MG PO TABS: 1.0000 mg | ORAL_TABLET | Freq: Three times a day (TID) | ORAL | Status: DC | PRN

## 2014-04-26 MED ORDER — OXYCODONE-ACETAMINOPHEN 5-325 MG PO TABS
2.0000 | ORAL_TABLET | Freq: Once | ORAL | Status: AC
  Administered 2014-04-26: 2 via ORAL
  Filled 2014-04-26: qty 2

## 2014-04-26 MED ORDER — HYDROCODONE-ACETAMINOPHEN 5-325 MG PO TABS: 1.0000 | ORAL_TABLET | ORAL | Status: DC | PRN

## 2014-04-26 NOTE — ED Notes (Signed)
Response to Haldol: pt. More cooperative to staff and family members.

## 2014-04-26 NOTE — ED Notes (Signed)
Pt has five visitors in room. Pt family informed for fourth time on visitor policy, ignoring.

## 2014-04-26 NOTE — Discharge Instructions (Signed)
Cast or Splint Care °Casts and splints support injured limbs and keep bones from moving while they heal. It is important to care for your cast or splint at home.   °HOME CARE INSTRUCTIONS °· Keep the cast or splint uncovered during the drying period. It can take 24 to 48 hours to dry if it is made of plaster. A fiberglass cast will dry in less than 1 hour. °· Do not rest the cast on anything harder than a pillow for the first 24 hours. °· Do not put weight on your injured limb or apply pressure to the cast until your health care provider gives you permission. °· Keep the cast or splint dry. Wet casts or splints can lose their shape and may not support the limb as well. A wet cast that has lost its shape can also create harmful pressure on your skin when it dries. Also, wet skin can become infected. °¨ Cover the cast or splint with a plastic bag when bathing or when out in the rain or snow. If the cast is on the trunk of the body, take sponge baths until the cast is removed. °¨ If your cast does become wet, dry it with a towel or a blow dryer on the cool setting only. °· Keep your cast or splint clean. Soiled casts may be wiped with a moistened cloth. °· Do not place any hard or soft foreign objects under your cast or splint, such as cotton, toilet paper, lotion, or powder. °· Do not try to scratch the skin under the cast with any object. The object could get stuck inside the cast. Also, scratching could lead to an infection. If itching is a problem, use a blow dryer on a cool setting to relieve discomfort. °· Do not trim or cut your cast or remove padding from inside of it. °· Exercise all joints next to the injury that are not immobilized by the cast or splint. For example, if you have a long leg cast, exercise the hip joint and toes. If you have an arm cast or splint, exercise the shoulder, elbow, thumb, and fingers. °· Elevate your injured arm or leg on 1 or 2 pillows for the first 1 to 3 days to decrease  swelling and pain. It is best if you can comfortably elevate your cast so it is higher than your heart. °SEEK MEDICAL CARE IF:  °· Your cast or splint cracks. °· Your cast or splint is too tight or too loose. °· You have unbearable itching inside the cast. °· Your cast becomes wet or develops a soft spot or area. °· You have a bad smell coming from inside your cast. °· You get an object stuck under your cast. °· Your skin around the cast becomes red or raw. °· You have new pain or worsening pain after the cast has been applied. °SEEK IMMEDIATE MEDICAL CARE IF:  °· You have fluid leaking through the cast. °· You are unable to move your fingers or toes. °· You have discolored (blue or white), cool, painful, or very swollen fingers or toes beyond the cast. °· You have tingling or numbness around the injured area. °· You have severe pain or pressure under the cast. °· You have any difficulty with your breathing or have shortness of breath. °· You have chest pain. °Document Released: 08/04/2000 Document Revised: 05/28/2013 Document Reviewed: 02/13/2013 °ExitCare® Patient Information ©2015 [employer]. This information is not intended to replace advice given to you by your health care   provider. Make sure you discuss any questions you have with your health care provider. ° °Boxer's Fracture °You have a break (fracture) of the fifth metacarpal bone. This is commonly called a boxer's fracture. This is the bone in the hand where the little finger attaches. The fracture is in the end of that bone, closest to the little finger. It is usually caused when you hit an object with a clenched fist. Often, the knuckle is pushed down by the impact. Sometimes, the fracture rotates out of position. A boxer's fracture will usually heal within 6 weeks, if it is treated properly and protected from re-injury. Surgery is sometimes needed. °A cast, splint, or bulky hand dressing may be used to protect and immobilize a boxer's fracture. Do  not remove this device or dressing until your caregiver approves. Keep your hand elevated, and apply ice packs for 15-20 minutes every 2 hours, for the first 2 days. Elevation and ice help reduce swelling and relieve pain. See your caregiver, or an orthopedic specialist, for follow-up care within the next 10 days. This is to make sure your fracture is healing properly. °Document Released: 08/07/2005 Document Revised: 10/30/2011 Document Reviewed: 01/25/2007 °ExitCare® Patient Information ©2015 [employer]. This information is not intended to replace advice given to you by your health care provider. Make sure you discuss any questions you have with your health care provider. ° °

## 2014-04-26 NOTE — ED Notes (Signed)
Triage reassesment. Pt c/o pain all over, family concerned that pt is not on antibiotic. Pt alert neck brace and left arm brace in place
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
## 2014-04-26 NOTE — ED Notes (Signed)
Neurosurgery consulted and did return call to EDP.
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
## 2014-04-26 NOTE — ED Notes (Signed)
Vital signs stable. 

## 2014-04-26 NOTE — Discharge Instructions (Signed)
Dehydration, Adult °Dehydration is when you lose more fluids from the body than you take in. Vital organs like the kidneys, brain, and heart cannot function without a proper amount of fluids and salt. Any loss of fluids from the body can cause dehydration.  °CAUSES  °· Vomiting. °· Diarrhea. °· Excessive sweating. °· Excessive urine output. °· Fever. °SYMPTOMS  °Mild dehydration °· Thirst. °· Dry lips. °· Slightly dry mouth. °Moderate dehydration °· Very dry mouth. °· Sunken eyes. °· Skin does not bounce back quickly when lightly pinched and released. °· Dark urine and decreased urine production. °· Decreased tear production. °· Headache. °Severe dehydration °· Very dry mouth. °· Extreme thirst. °· Rapid, weak pulse (more than 100 beats per minute at rest). °· Cold hands and feet. °· Not able to sweat in spite of heat and temperature. °· Rapid breathing. °· Blue lips. °· Confusion and lethargy. °· Difficulty being awakened. °· Minimal urine production. °· No tears. °DIAGNOSIS  °Your caregiver will diagnose dehydration based on your symptoms and your exam. Blood and urine tests will help confirm the diagnosis. The diagnostic evaluation should also identify the cause of dehydration. °TREATMENT  °Treatment of mild or moderate dehydration can often be done at home by increasing the amount of fluids that you drink. It is best to drink small amounts of fluid more often. Drinking too much at one time can make vomiting worse. Refer to the home care instructions below. °Severe dehydration needs to be treated at the hospital where you will probably be given intravenous (IV) fluids that contain water and electrolytes. °HOME CARE INSTRUCTIONS  °· Ask your caregiver about specific rehydration instructions. °· Drink enough fluids to keep your urine clear or pale yellow. °· Drink small amounts frequently if you have nausea and vomiting. °· Eat as you normally do. °· Avoid: °· Foods or drinks high in sugar. °· Carbonated  drinks. °· Juice. °· Extremely hot or cold fluids. °· Drinks with caffeine. °· Fatty, greasy foods. °· Alcohol. °· Tobacco. °· Overeating. °· Gelatin desserts. °· Wash your hands well to avoid spreading bacteria and viruses. °· Only take over-the-counter or prescription medicines for pain, discomfort, or fever as directed by your caregiver. °· Ask your caregiver if you should continue all prescribed and over-the-counter medicines. °· Keep all follow-up appointments with your caregiver. °SEEK MEDICAL CARE IF: °· You have abdominal pain and it increases or stays in one area (localizes). °· You have a rash, stiff neck, or severe headache. °· You are irritable, sleepy, or difficult to awaken. °· You are weak, dizzy, or extremely thirsty. °SEEK IMMEDIATE MEDICAL CARE IF:  °· You are unable to keep fluids down or you get worse despite treatment. °· You have frequent episodes of vomiting or diarrhea. °· You have blood or green matter (bile) in your vomit. °· You have blood in your stool or your stool looks black and tarry. °· You have not urinated in 6 to 8 hours, or you have only urinated a small amount of very dark urine. °· You have a fever. °· You faint. °MAKE SURE YOU:  °· Understand these instructions. °· Will watch your condition. °· Will get help right away if you are not doing well or get worse. °Document Released: 08/07/2005 Document Revised: 10/30/2011 Document Reviewed: 03/27/2011 °ExitCare® Patient Information ©2015 [employer]. This information is not intended to replace advice given to you by your health care provider. Make sure you discuss any questions you have with your health care   provider. ° °Muscle Cramps and Spasms °Muscle cramps and spasms occur when a muscle or muscles tighten and you have no control over this tightening (involuntary muscle contraction). They are a common problem and can develop in any muscle. The most common place is in the calf muscles of the leg. Both muscle cramps and muscle  spasms are involuntary muscle contractions, but they also have differences:  °· Muscle cramps are sporadic and painful. They may last a few seconds to a quarter of an hour. Muscle cramps are often more forceful and last longer than muscle spasms. °· Muscle spasms may or may not be painful. They may also last just a few seconds or much longer. °CAUSES  °It is uncommon for cramps or spasms to be due to a serious underlying problem. In many cases, the cause of cramps or spasms is unknown. Some common causes are:  °· Overexertion.   °· Overuse from repetitive motions (doing the same thing over and over).   °· Remaining in a certain position for a long period of time.   °· Improper preparation, form, or technique while performing a sport or activity.   °· Dehydration.   °· Injury.   °· Side effects of some medicines.   °· Abnormally low levels of the salts and ions in your blood (electrolytes), especially potassium and calcium. This could happen if you are taking water pills (diuretics) or you are pregnant.   °Some underlying medical problems can make it more likely to develop cramps or spasms. These include, but are not limited to:  °· Diabetes.   °· Parkinson disease.   °· Hormone disorders, such as thyroid problems.   °· Alcohol abuse.   °· Diseases specific to muscles, joints, and bones.   °· Blood vessel disease where not enough blood is getting to the muscles.   °HOME CARE INSTRUCTIONS  °· Stay well hydrated. Drink enough water and fluids to keep your urine clear or pale yellow. °· It may be helpful to massage, stretch, and relax the affected muscle. °· For tight or tense muscles, use a warm towel, heating pad, or hot shower water directed to the affected area. °· If you are sore or have pain after a cramp or spasm, applying ice to the affected area may relieve discomfort. °¨ Put ice in a plastic bag. °¨ Place a towel between your skin and the bag. °¨ Leave the ice on for 15-20 minutes, 03-04 times a  day. °· Medicines used to treat a known cause of cramps or spasms may help reduce their frequency or severity. Only take over-the-counter or prescription medicines as directed by your caregiver. °SEEK MEDICAL CARE IF:  °Your cramps or spasms get more severe, more frequent, or do not improve over time.  °MAKE SURE YOU:  °· Understand these instructions. °· Will watch your condition. °· Will get help right away if you are not doing well or get worse. °Document Released: 01/27/2002 Document Revised: 12/02/2012 Document Reviewed: 07/24/2012 °ExitCare® Patient Information ©2015 [employer]. This information is not intended to replace advice given to you by your health care provider. Make sure you discuss any questions you have with your health care provider. ° °

## 2014-04-26 NOTE — ED Notes (Signed)
Family at bedside, upset because patient is shivering and sweating, as well as grunting. Pt family thinks that nothing is being done and mentioned having pt transferred to [facility]. Pt family informed about labs, blood work, medications and IV therapy. Pt family still upset.
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
## 2014-04-26 NOTE — ED Notes (Signed)
ambulated patient in hallway, pt ambulated without difficulty.

## 2014-04-26 NOTE — Progress Notes (Signed)
Orthopedic Tech Progress Note Patient Details:  [name] 09/16/1990 [id number]  Ortho Devices Type of Ortho Device: Ulna gutter splint Ortho Device/Splint Location: L UE Ortho Device/Splint Interventions: Application   Chales Pelissier T 04/26/2014, 12:50 AM

## 2014-04-28 ENCOUNTER — Ambulatory Visit: Payer: 59 | Admitting: Family Medicine

## 2014-04-29 ENCOUNTER — Encounter: Payer: Self-pay | Admitting: Family Medicine

## 2014-04-29 ENCOUNTER — Ambulatory Visit (INDEPENDENT_AMBULATORY_CARE_PROVIDER_SITE_OTHER): Payer: 59 | Admitting: Family Medicine

## 2014-04-29 VITALS — BP 130/82 | Ht 72.0 in | Wt 171.0 lb

## 2014-04-29 DIAGNOSIS — S129XXS Fracture of neck, unspecified, sequela: Secondary | ICD-10-CM

## 2014-04-29 DIAGNOSIS — IMO0002 Reserved for concepts with insufficient information to code with codable children: Secondary | ICD-10-CM

## 2014-04-29 DIAGNOSIS — Z0289 Encounter for other administrative examinations: Secondary | ICD-10-CM

## 2014-04-29 MED ORDER — DOXYCYCLINE HYCLATE 100 MG PO CAPS: 100.0000 mg | ORAL_CAPSULE | Freq: Two times a day (BID) | ORAL | Status: DC
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
## 2014-06-19 ENCOUNTER — Encounter: Payer: Self-pay | Admitting: Nurse Practitioner

## 2014-06-19 ENCOUNTER — Ambulatory Visit (INDEPENDENT_AMBULATORY_CARE_PROVIDER_SITE_OTHER): Payer: 59 | Admitting: Nurse Practitioner

## 2014-06-19 VITALS — BP 128/88 | Ht 72.0 in | Wt 174.0 lb

## 2014-06-19 DIAGNOSIS — S62337S Displaced fracture of neck of fifth metacarpal bone, left hand, sequela: Secondary | ICD-10-CM

## 2014-06-21 ENCOUNTER — Emergency Department (HOSPITAL_COMMUNITY)
Admission: EM | Admit: 2014-06-21 | Discharge: 2014-06-21 | Disposition: A | Discharge status: Home / Self Care | Payer: 59 | Source: Home / Self Care | Attending: Emergency Medicine | Admitting: Emergency Medicine

## 2014-06-21 ENCOUNTER — Encounter (HOSPITAL_COMMUNITY): Payer: Self-pay | Admitting: Emergency Medicine

## 2014-06-21 DIAGNOSIS — J029 Acute pharyngitis, unspecified: Secondary | ICD-10-CM

## 2014-06-21 MED ORDER — DEXAMETHASONE SODIUM PHOSPHATE 10 MG/ML IJ SOLN
INTRAMUSCULAR | Status: AC
  Filled 2014-06-21: qty 1

## 2014-06-21 MED ORDER — DEXAMETHASONE SODIUM PHOSPHATE 10 MG/ML IJ SOLN
10.0000 mg | Freq: Once | INTRAMUSCULAR | Status: AC
  Administered 2014-06-21: 10 mg via INTRAMUSCULAR

## 2014-06-21 MED ORDER — AMOXICILLIN 500 MG PO CAPS: 500.0000 mg | ORAL_CAPSULE | Freq: Two times a day (BID) | ORAL | Status: DC

## 2014-06-21 NOTE — ED Notes (Signed)
23 year old male with his mom.  States he woke up this morning gagging with a very sore throat.  His mother states that he has a history of strep.  His uvula and pharynx  are red, swollen  No exudate seen.  He is able to swallow but states that it hurts real bad.

## 2014-06-21 NOTE — ED Provider Notes (Signed)
CSN: [id number]     Arrival date & time 06/21/14  0941 History   First MD Initiated Contact with Patient 06/21/14 [id number]     Chief Complaint  Patient presents with  . Sore Throat   (Consider location/radiation/quality/duration/timing/severity/associated sxs/prior Treatment) HPI  He is a 23 year old man here with his mother for evaluation of sore throat. He woke up this morning gagging. He has pain with swallowing. He is able to breathe all right. He had some chills yesterday. Denies any fevers. No nausea. He does have a cough, but this is chronic secondary to smoking. Denies any rhinorrhea or nasal congestion. He has had multiple episodes of sore throat and strep throat in the past. He states this occurs somewhere between 1 and 3 times a year.  Past Medical History  Diagnosis Date  . Staph infection    Past Surgical History  Procedure Laterality Date  . Hand surgery     No family history on file. History  Substance Use Topics  . Smoking status: Former Games developermoker  . Smokeless tobacco: Current User    Types: Chew  . Alcohol Use: Yes    Review of Systems  HENT: Positive for sore throat and trouble swallowing (pain). Negative for congestion and rhinorrhea.   Respiratory: Positive for cough. Negative for shortness of breath.   Gastrointestinal: Negative for nausea and vomiting.  Skin: Negative for rash.  Neurological: Negative for headaches.    Allergies  Review of patient's allergies indicates no known allergies.  Home Medications   Prior to Admission medications   Medication Sig Start Date End Date Taking? Authorizing Provider  amoxicillin (AMOXIL) 500 MG capsule Take 1 capsule (500 mg total) by mouth 2 (two) times daily. 06/21/14   Charm RingsErin J Marina Boerner, MD   BP 129/85 mmHg  Pulse 106  Temp(Src) 98.3 F (36.8 C) (Oral)  Resp 16  SpO2 97% Physical Exam  Constitutional: He appears well-developed and well-nourished. No distress.  HENT:  Head: Normocephalic and atraumatic.  Right  Ear: External ear normal.  Left Ear: External ear normal.  Nose: No mucosal edema or rhinorrhea.  Mouth/Throat: Mucous membranes are not dry. Abnormal dentition (missing upper front 2 teeth). Uvula swelling present. Posterior oropharyngeal edema and posterior oropharyngeal erythema present. No oropharyngeal exudate or tonsillar abscesses.  Uvula and tonsil are quite swollen.    Eyes: Conjunctivae and EOM are normal.  Neck: Neck supple.  Cardiovascular: Normal rate, regular rhythm and normal heart sounds.   No murmur heard. Pulmonary/Chest: Effort normal. No respiratory distress. He has no wheezes. He has no rales.  Coarse sounds on the right  Lymphadenopathy:    He has no cervical adenopathy.    ED Course  Procedures (including critical care time) Labs Review Labs Reviewed - No data to display  Imaging Review No results found.   MDM   1. Pharyngitis    Decadron 10 mg IM given for swelling. Rapid strep is negative. Provided prescription for amoxicillin to fill if no improvement in 48 hours. Reviewed warning signs of stridor, difficulty breathing, unable to swallow liquids or manage secretions. These would warrant going to the emergency room. Follow-up as needed.    Charm RingsErin J Leilene Diprima, MD 06/21/14 [id number]

## 2014-06-21 NOTE — Discharge Instructions (Signed)
Your strep test was negative today. We gave you a shot of a steroid to help with the swelling. Do salt water gargles. Drink plenty of fluids.  I have given you a prescription for an antibiotic, amoxicillin.  If you are not improving by Tuesday, please fill the antibiotic. If you develop trouble breathing or are unable to swallow liquids, please go to the emergency room.

## 2014-06-22 LAB — POCT RAPID STREP A: Streptococcus, Group A Screen (Direct): NEGATIVE

## 2014-06-23 LAB — CULTURE, GROUP A STREP

## 2014-06-24 ENCOUNTER — Encounter: Payer: Self-pay | Admitting: Nurse Practitioner
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
## 2014-06-29 ENCOUNTER — Ambulatory Visit: Payer: 59 | Admitting: Family Medicine

## 2014-07-03 ENCOUNTER — Encounter: Payer: 59 | Admitting: Family Medicine

## 2014-07-13 ENCOUNTER — Ambulatory Visit (INDEPENDENT_AMBULATORY_CARE_PROVIDER_SITE_OTHER): Payer: 59 | Admitting: Family Medicine

## 2014-07-13 ENCOUNTER — Encounter: Payer: Self-pay | Admitting: Family Medicine

## 2014-07-13 VITALS — BP 130/78 | Ht 72.0 in | Wt 174.6 lb

## 2014-07-13 DIAGNOSIS — Z0000 Encounter for general adult medical examination without abnormal findings: Secondary | ICD-10-CM

## 2014-07-13 LAB — LIPID PANEL
Cholesterol: 178 mg/dL (ref 0–200)
HDL: 71 mg/dL (ref >39)
LDL Cholesterol: 93 mg/dL (ref 0–99)
Total CHOL/HDL Ratio: 2.5 Ratio
Triglycerides: 71 mg/dL (ref <150)
VLDL: 14 mg/dL (ref 0–40)

## 2014-07-13 LAB — GLUCOSE, RANDOM: Glucose, Bld: 82 mg/dL (ref 70–99)

## 2014-07-13 NOTE — Patient Instructions (Signed)
Smoking Cessation Quitting smoking is important to your health and has many advantages. However, it is not always easy to quit since nicotine is a very addictive drug. Oftentimes, people try 3 times or more before being able to quit. This document explains the best ways for you to prepare to quit smoking. Quitting takes hard work and a lot of effort, but you can do it. ADVANTAGES OF QUITTING SMOKING  You will live longer, feel better, and live better.  Your body will feel the impact of quitting smoking almost immediately.  Within 20 minutes, blood pressure decreases. Your pulse returns to its normal level.  After 8 hours, carbon monoxide levels in the blood return to normal. Your oxygen level increases.  After 24 hours, the chance of having a heart attack starts to decrease. Your breath, hair, and body stop smelling like smoke.  After 48 hours, damaged nerve endings begin to recover. Your sense of taste and smell improve.  After 72 hours, the body is virtually free of nicotine. Your bronchial tubes relax and breathing becomes easier.  After 2 to 12 weeks, lungs can hold more air. Exercise becomes easier and circulation improves.  The risk of having a heart attack, stroke, cancer, or lung disease is greatly reduced.  After 1 year, the risk of coronary heart disease is cut in half.  After 5 years, the risk of stroke falls to the same as a nonsmoker.  After 10 years, the risk of lung cancer is cut in half and the risk of other cancers decreases significantly.  After 15 years, the risk of coronary heart disease drops, usually to the level of a nonsmoker.  If you are pregnant, quitting smoking will improve your chances of having a healthy baby.  The people you live with, especially any children, will be healthier.  You will have extra money to spend on things other than cigarettes. QUESTIONS TO THINK ABOUT BEFORE ATTEMPTING TO QUIT You may want to talk about your answers with your  health care provider.  Why do you want to quit?  If you tried to quit in the past, what helped and what did not?  What will be the most difficult situations for you after you quit? How will you plan to handle them?  Who can help you through the tough times? Your family? Friends? A health care provider?  What pleasures do you get from smoking? What ways can you still get pleasure if you quit? Here are some questions to ask your health care provider:  How can you help me to be successful at quitting?  What medicine do you think would be best for me and how should I take it?  What should I do if I need more help?  What is smoking withdrawal like? How can I get information on withdrawal? GET READY  Set a quit date.  Change your environment by getting rid of all cigarettes, ashtrays, matches, and lighters in your home, car, or work. Do not let people smoke in your home.  Review your past attempts to quit. Think about what worked and what did not. GET SUPPORT AND ENCOURAGEMENT You have a better chance of being successful if you have help. You can get support in many ways.  Tell your family, friends, and coworkers that you are going to quit and need their support. Ask them not to smoke around you.  Get individual, group, or telephone counseling and support. Programs are available at local hospitals and health centers. Call   your local health department for information about programs in your area.  Spiritual beliefs and practices may help some smokers quit.  Download a "quit meter" on your computer to keep track of quit statistics, such as how long you have gone without smoking, cigarettes not smoked, and money saved.  Get a self-help book about quitting smoking and staying off tobacco. LEARN NEW SKILLS AND BEHAVIORS  Distract yourself from urges to smoke. Talk to someone, go for a walk, or occupy your time with a task.  Change your normal routine. Take a different route to work.  Drink tea instead of coffee. Eat breakfast in a different place.  Reduce your stress. Take a hot bath, exercise, or read a book.  Plan something enjoyable to do every day. Reward yourself for not smoking.  Explore interactive web-based programs that specialize in helping you quit. GET MEDICINE AND USE IT CORRECTLY Medicines can help you stop smoking and decrease the urge to smoke. Combining medicine with the above behavioral methods and support can greatly increase your chances of successfully quitting smoking.  Nicotine replacement therapy helps deliver nicotine to your body without the negative effects and risks of smoking. Nicotine replacement therapy includes nicotine gum, lozenges, inhalers, nasal sprays, and skin patches. Some may be available over-the-counter and others require a prescription.  Antidepressant medicine helps people abstain from smoking, but how this works is unknown. This medicine is available by prescription.  Nicotinic receptor partial agonist medicine simulates the effect of nicotine in your brain. This medicine is available by prescription. Ask your health care provider for advice about which medicines to use and how to use them based on your health history. Your health care provider will tell you what side effects to look out for if you choose to be on a medicine or therapy. Carefully read the information on the package. Do not use any other product containing nicotine while using a nicotine replacement product.  RELAPSE OR DIFFICULT SITUATIONS Most relapses occur within the first 3 months after quitting. Do not be discouraged if you start smoking again. Remember, most people try several times before finally quitting. You may have symptoms of withdrawal because your body is used to nicotine. You may crave cigarettes, be irritable, feel very hungry, cough often, get headaches, or have difficulty concentrating. The withdrawal symptoms are only temporary. They are strongest  when you first quit, but they will go away within 10-14 days. To reduce the chances of relapse, try to:  Avoid drinking alcohol. Drinking lowers your chances of successfully quitting.  Reduce the amount of caffeine you consume. Once you quit smoking, the amount of caffeine in your body increases and can give you symptoms, such as a rapid heartbeat, sweating, and anxiety.  Avoid smokers because they can make you want to smoke.  Do not let weight gain distract you. Many smokers will gain weight when they quit, usually less than 10 pounds. Eat a healthy diet and stay active. You can always lose the weight gained after you quit.  Find ways to improve your mood other than smoking. FOR MORE INFORMATION  www.smokefree.gov  Document Released: 08/01/2001 Document Revised: 12/22/2013 Document Reviewed: 11/16/2011 ExitCare Patient Information 2015 [employer]. This information is not intended to replace advice given to you by your health care provider. Make sure you discuss any questions you have with your health care provider.  
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
## 2014-07-23 ENCOUNTER — Encounter: Payer: 59 | Admitting: Family Medicine

## 2015-01-31 ENCOUNTER — Emergency Department (HOSPITAL_COMMUNITY)
Admission: EM | Admit: 2015-01-31 | Discharge: 2015-02-01 | Disposition: A | Discharge status: Home / Self Care | Payer: BLUE CROSS/BLUE SHIELD | Attending: Emergency Medicine | Admitting: Emergency Medicine

## 2015-01-31 ENCOUNTER — Encounter (HOSPITAL_COMMUNITY): Payer: Self-pay | Admitting: *Deleted

## 2015-01-31 ENCOUNTER — Emergency Department (HOSPITAL_COMMUNITY): Payer: BLUE CROSS/BLUE SHIELD

## 2015-01-31 DIAGNOSIS — S61411A Laceration without foreign body of right hand, initial encounter: Secondary | ICD-10-CM | POA: Diagnosis present

## 2015-01-31 DIAGNOSIS — Y9289 Other specified places as the place of occurrence of the external cause: Secondary | ICD-10-CM | POA: Diagnosis not present

## 2015-01-31 DIAGNOSIS — W25XXXA Contact with sharp glass, initial encounter: Secondary | ICD-10-CM | POA: Insufficient documentation

## 2015-01-31 DIAGNOSIS — Y9389 Activity, other specified: Secondary | ICD-10-CM | POA: Diagnosis not present

## 2015-01-31 DIAGNOSIS — S51811A Laceration without foreign body of right forearm, initial encounter: Secondary | ICD-10-CM | POA: Diagnosis not present

## 2015-01-31 DIAGNOSIS — Z8619 Personal history of other infectious and parasitic diseases: Secondary | ICD-10-CM | POA: Diagnosis not present

## 2015-01-31 DIAGNOSIS — Z720 Tobacco use: Secondary | ICD-10-CM | POA: Insufficient documentation

## 2015-01-31 DIAGNOSIS — Z23 Encounter for immunization: Secondary | ICD-10-CM | POA: Insufficient documentation

## 2015-01-31 DIAGNOSIS — Y998 Other external cause status: Secondary | ICD-10-CM | POA: Insufficient documentation

## 2015-01-31 MED ORDER — LIDOCAINE HCL (PF) 1 % IJ SOLN
INTRAMUSCULAR | Status: AC
  Filled 2015-01-31: qty 5

## 2015-01-31 MED ORDER — TETANUS-DIPHTH-ACELL PERTUSSIS 5-2.5-18.5 LF-MCG/0.5 IM SUSP
0.5000 mL | Freq: Once | INTRAMUSCULAR | Status: AC
  Administered 2015-01-31: 0.5 mL via INTRAMUSCULAR
  Filled 2015-01-31: qty 0.5
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
## 2015-01-31 NOTE — ED Provider Notes (Signed)
CSN: [id number]     Arrival date & time 01/31/15  2104 History   First MD Initiated Contact with Patient 01/31/15 2204     Chief Complaint  Patient presents with  . Extremity Laceration     (Consider location/radiation/quality/duration/timing/severity/associated sxs/prior Treatment) Patient is a 24 y.o. male presenting with skin laceration. The history is provided by the patient.  Laceration Location:  Hand Hand laceration location:  R hand Length (cm):  2 Depth:  Through underlying tissue Quality: jagged   Laceration mechanism:  Broken glass Pain details:    Quality:  Burning and sharp   Severity:  Moderate   Timing:  Constant   Progression:  Unchanged Foreign body present:  Unable to specify Relieved by:  None tried Worsened by:  Nothing tried Ineffective treatments:  None tried Tetanus status:  Out of date  [name] is a 25 y.o. male who presents to the ED with a laceration to the dorsum of the right hand. He states that he locked his car with it running and had to break the window to get in. He has a laceration over the base of the middle finger and several superficial laceration to the hand and forearm. He denies other injuries.   Past Medical History  Diagnosis Date  . Staph infection    Past Surgical History  Procedure Laterality Date  . Hand surgery     History reviewed. No pertinent family history. History  Substance Use Topics  . Smoking status: Current Every Day Smoker -- 1.00 packs/day    Types: Cigarettes  . Smokeless tobacco: Current User    Types: Chew  . Alcohol Use: 0.0 oz/week    0 Standard drinks or equivalent per week    Review of Systems Negative except as stated in HPI   Allergies  Review of patient's allergies indicates no known allergies.  Home Medications   Prior to Admission medications   Not on File   BP 122/70 mmHg  Pulse 83  Temp(Src) 98.7 F (37.1 C) (Oral)  Resp 20  Ht 6' (1.829 m)  Wt 170 lb (77.111 kg)  BMI  23.05 kg/m2  SpO2 96% Physical Exam  Constitutional: He is oriented to person, place, and time. He appears well-developed and well-nourished. No distress.  HENT:  Head: Normocephalic and atraumatic.  Eyes: EOM are normal.  Neck: Normal range of motion. Neck supple.  Cardiovascular: Normal rate.   Pulmonary/Chest: Effort normal.  Musculoskeletal: Normal range of motion.       Right hand: He exhibits tenderness and laceration. He exhibits normal range of motion, normal capillary refill, no deformity and no swelling. Normal sensation noted. Normal strength noted.       Hands: Multiple superficial lacerations to the dorsum of the right hand and forearm. There is one laceration to the dorsum of the hand that will require sutures.  Radial pulses 2+ bilateral.   Neurological: He is alert and oriented to person, place, and time. No cranial nerve deficit.  Skin: Skin is warm and dry.  Psychiatric: He has a normal mood and affect. His behavior is normal.  Nursing note and vitals reviewed.   ED Course  LACERATION REPAIR Date/Time: 01/31/2015 11:45 PM Performed by: Janne Napoleon Authorized by: Janne Napoleon Consent: Verbal consent obtained. Risks and benefits: risks, benefits and alternatives were discussed Consent given by: patient Patient understanding: patient states understanding of the procedure being performed Patient identity confirmed: verbally with patient Body area: upper extremity Location details: right  hand Laceration length: 2 cm Foreign bodies: no foreign bodies Tendon involvement: none Nerve involvement: none Vascular damage: no Anesthesia: local infiltration Local anesthetic: lidocaine 1% without epinephrine Anesthetic total: 2 ml Patient sedated: no Preparation: Patient was prepped and draped in the usual sterile fashion. Irrigation solution: saline Irrigation method: syringe Amount of cleaning: standard Debridement: none Degree of undermining: none Skin closure:  5-0 Prolene Number of sutures: 5 Technique: simple Approximation: close Approximation difficulty: simple Dressing: pressure dressing Patient tolerance: Patient tolerated the procedure well with no immediate complications   (including critical care time)  Labs Review Labs Reviewed - No data to display  Imaging Review Dg Hand Complete Right  01/31/2015   CLINICAL DATA:  Patient punched right hand through window of a truck. Right hand pain. History of staph infection.  EXAM: RIGHT HAND - COMPLETE 3+ VIEW  COMPARISON:  12/13/2007.  12/11/2007  FINDINGS: Postoperative changes with previous plate and screw fixation of the right fourth metacarpal bone. Hardware components appear intact in the fracture line is no longer visible. No evidence of acute fracture or dislocation in the right hand. No radiopaque soft tissue foreign bodies.  IMPRESSION: No acute bony abnormalities. Old healed fracture of the fourth metacarpal bone with plate and screw fixation.   Electronically Signed   By: Burman Nieves M.D.   On: 01/31/2015 22:53   Dressing, splint, ibuprofen for pain.   MDM  24 y.o. male with multiple superficial lacerations to the dorsum of the right hand and forearm and one laceration to the dorsum of the hand requiring sutures. Stable for d/c without neurovascular deficits. Discussed with the patient clinical and x-ray findings and plan of care and all questioned fully answered. He will follow up in 7 days for suture removal with his PCP or return here sooner if any problems arise.    Final diagnoses:  Laceration of hand, right, initial encounter       [facility], NP 01/31/15 2352  Raeford Razor, MD 02/01/15 1537

## 2015-02-08 ENCOUNTER — Ambulatory Visit (INDEPENDENT_AMBULATORY_CARE_PROVIDER_SITE_OTHER): Payer: BLUE CROSS/BLUE SHIELD | Admitting: Family Medicine

## 2015-02-08 ENCOUNTER — Encounter: Payer: Self-pay | Admitting: Family Medicine

## 2015-02-08 VITALS — BP 102/80 | Ht 72.0 in | Wt 167.1 lb

## 2015-02-08 DIAGNOSIS — S60221D Contusion of right hand, subsequent encounter: Secondary | ICD-10-CM | POA: Diagnosis not present

## 2015-02-08 NOTE — Progress Notes (Signed)
   Subjective:    Patient ID: [name], male    DOB: Jan 09, 1991, 24 y.o.   MRN: [id number]  HPI Patient is here today because he was seen at the ED on 01/31/15 for laceration of the right hand and he needs his sutures removed.   Patient states received a tetanus shot.  Overall handling well no fever no chills no discharge. Expect  Was not given any narcotics.  Patient states he has no other concerns at this time.   Review of Systems No headache no chest pain no back pain    Objective:   Physical Exam Alert vitals stable. Lungs clear. Heart regular rhythm. Dorsum hand healing well. Sutures removed. Wound care discussed       Assessment & Plan:  Impression hand contusion/laceration with improvement plan wound care discussed WSL

## 2015-06-09 ENCOUNTER — Encounter: Payer: Self-pay | Admitting: Family Medicine

## 2015-06-09 ENCOUNTER — Ambulatory Visit (INDEPENDENT_AMBULATORY_CARE_PROVIDER_SITE_OTHER): Payer: BLUE CROSS/BLUE SHIELD | Admitting: Family Medicine

## 2015-06-09 VITALS — BP 102/70 | Temp 99.2°F | Ht 72.0 in | Wt 168.1 lb

## 2015-06-09 DIAGNOSIS — J019 Acute sinusitis, unspecified: Secondary | ICD-10-CM | POA: Diagnosis not present

## 2015-06-09 DIAGNOSIS — J029 Acute pharyngitis, unspecified: Secondary | ICD-10-CM | POA: Diagnosis not present

## 2015-06-09 MED ORDER — AMOXICILLIN-POT CLAVULANATE 875-125 MG PO TABS: 1.0000 | ORAL_TABLET | Freq: Two times a day (BID) | ORAL | Status: DC

## 2015-06-09 NOTE — Progress Notes (Signed)
   Subjective:    Patient ID: [name], male    DOB: 07/29/1991, 24 y.o.   MRN: [id number]  Sore Throat  This is a new problem. The problem has been unchanged. Neither side of throat is experiencing more pain than the other. There has been no fever. The pain is moderate. Associated symptoms include congestion, coughing and ear pain. Associated symptoms comments: Runny nose, chest congestion. He has tried nothing for the symptoms. The treatment provided no relief.    Patient states he has no other concerns at this time.  Patient states started a few days ago but then worse over the past couple days with severe sore throat hard time swallowing PMH benign does smoke  Review of Systems  Constitutional: Negative for fever and activity change.  HENT: Positive for congestion, ear pain and rhinorrhea.   Eyes: Negative for discharge.  Respiratory: Positive for cough. Negative for wheezing.   Cardiovascular: Negative for chest pain.       Objective:   Physical Exam  Constitutional: He appears well-developed.  HENT:  Head: Normocephalic.  Mouth/Throat: Oropharynx is clear and moist. No oropharyngeal exudate.  Neck: Normal range of motion.  Cardiovascular: Normal rate, regular rhythm and normal heart sounds.   No murmur heard. Pulmonary/Chest: Effort normal and breath sounds normal. He has no wheezes.  Lymphadenopathy:    He has no cervical adenopathy.  Neurological: He exhibits normal muscle tone.  Skin: Skin is warm and dry.  Nursing note and vitals reviewed.         Assessment & Plan:  Pharyngitis with some element of rhinosinusitis antibiotics prescribed warning signs discuss should gradually get better follow-up sooner if any particular problems. Work note given as well.

## 2015-06-11 ENCOUNTER — Encounter: Payer: Self-pay | Admitting: Family Medicine

## 2015-07-20 ENCOUNTER — Ambulatory Visit (INDEPENDENT_AMBULATORY_CARE_PROVIDER_SITE_OTHER): Payer: BLUE CROSS/BLUE SHIELD | Admitting: Family Medicine

## 2015-07-20 ENCOUNTER — Encounter: Payer: Self-pay | Admitting: Family Medicine

## 2015-07-20 VITALS — Temp 98.8°F | Ht 72.0 in | Wt 171.5 lb

## 2015-07-20 DIAGNOSIS — L708 Other acne: Secondary | ICD-10-CM

## 2015-07-20 MED ORDER — DOXYCYCLINE HYCLATE 100 MG PO CAPS
100.0000 mg | ORAL_CAPSULE | Freq: Every day | ORAL | Status: DC
Start: 2015-07-20 — End: 2015-12-20

## 2015-07-20 MED ORDER — DOXYCYCLINE HYCLATE 100 MG PO CAPS: 100.0000 mg | ORAL_CAPSULE | Freq: Two times a day (BID) | ORAL | Status: DC

## 2015-07-20 NOTE — Progress Notes (Signed)
   Subjective:    Patient ID: [name], male    DOB: 10/19/1990, 24 y.o.   MRN: [id number]  Rash This is a new problem. The current episode started yesterday. The problem is unchanged. The affected locations include the right upper leg. The rash is characterized by redness and itchiness. He was exposed to nothing. (Headaches) Past treatments include nothing. The treatment provided no relief.   This is been going on for a few weeks but then got worse over the past few days worried about its of therefore he is coming in to be seen   Review of Systems  Skin: Positive for rash.   Tenderness pain not draining    Objective:   Physical Exam  Multiple folliculitis on the legs a couple the spots are tender to the touch slightly red slightly raised a couple these areas have what appears to be MRSA with some dead skin around.      Assessment & Plan:  MRSA rash on the legs along with some mild acne issues on the legs doxycycline twice a day for 10 days then doxycycline once daily over the next 2 months follow-up if progressive troubles warning signs discussed warm compresses frequently no abscess no need for any surgery if worse follow-up

## 2015-07-21 ENCOUNTER — Ambulatory Visit: Payer: BLUE CROSS/BLUE SHIELD | Admitting: Family Medicine

## 2015-12-20 ENCOUNTER — Encounter: Payer: Self-pay | Admitting: Family Medicine

## 2015-12-20 ENCOUNTER — Ambulatory Visit (INDEPENDENT_AMBULATORY_CARE_PROVIDER_SITE_OTHER): Payer: 59 | Admitting: Family Medicine

## 2015-12-20 VITALS — Temp 98.8°F | Ht 72.0 in | Wt 180.4 lb

## 2015-12-20 DIAGNOSIS — R21 Rash and other nonspecific skin eruption: Secondary | ICD-10-CM | POA: Diagnosis not present

## 2015-12-20 MED ORDER — DOXYCYCLINE HYCLATE 100 MG PO TABS: 100.0000 mg | ORAL_TABLET | Freq: Two times a day (BID) | ORAL | Status: DC

## 2015-12-20 NOTE — Progress Notes (Signed)
   Subjective:    Patient ID: [name], male    DOB: 04/05/1991, 25 y.o.   MRN: [id number]  HPI Patient arrives with c/o knot like rash on left thumb after getting scratched by unknown fish 3 weeks ago,  Patient scraped hand with a fish. He thought it was a stingray but it looked different, therefore likely a skate. Caught [place]. [place]. Next  Developed irritation in and subsequently a rash. Somewhat discomforting at times somewhat irritating  Review of Systems    no fever no chills no systemic features Objective:   Physical Exam  Alert vital stable lungs clear heart rhythm H&T normal hand patchy nodular rash at site of abrasion.      Assessment & Plan:   Impression 433-week-old patchy nodular rash at site of fish injury and salt water. Likely an atypical species such as Mycobacterium marinum etc. plan doxy 100 twice a day 30 days. Expect slow resolution warning signs discussed WSL

## 2016-03-02 ENCOUNTER — Encounter: Payer: Self-pay | Admitting: Family Medicine

## 2016-03-02 ENCOUNTER — Ambulatory Visit (INDEPENDENT_AMBULATORY_CARE_PROVIDER_SITE_OTHER): Payer: 59 | Admitting: Family Medicine

## 2016-03-02 VITALS — BP 120/80 | Ht 72.0 in | Wt 174.4 lb

## 2016-03-02 DIAGNOSIS — S0511XA Contusion of eyeball and orbital tissues, right eye, initial encounter: Secondary | ICD-10-CM | POA: Diagnosis not present

## 2016-03-02 DIAGNOSIS — S0500XA Injury of conjunctiva and corneal abrasion without foreign body, unspecified eye, initial encounter: Secondary | ICD-10-CM | POA: Diagnosis not present

## 2016-03-02 DIAGNOSIS — S0501XA Injury of conjunctiva and corneal abrasion without foreign body, right eye, initial encounter: Secondary | ICD-10-CM | POA: Diagnosis not present

## 2016-03-02 NOTE — Progress Notes (Signed)
   Subjective:    Patient ID: [name], male    DOB: 12/31/1990, 25 y.o.   MRN: [id number]  Eye Injury  The right eye is affected. This is a new problem. The current episode started yesterday. The injury mechanism was a foreign body. Associated symptoms include eye redness and a foreign body sensation. Associated symptoms comments: Watering,. He has tried nothing for the symptoms.  Patient was weaned eating had a rock lip up and hit him in the eyeball related pain and discomfort. Denies any other particular troubles Patient states no other concerns this visit. Initially he had blurred vision but doing better now it's just watering feels irritated and sore  Review of Systems  Eyes: Positive for redness.       Objective:   Physical Exam  Right eye red left eye normal pupils responsive to light slightly smaller on the right side eardrums normal      Assessment & Plan:  EYEContusion. There is redness with the eyeball. With staining drops I do not find obvious abrasion. The retina appears normal. I believe it is wise to have eye doctor check patient referred to Dr. Charise Killianotter today

## 2016-03-17 DIAGNOSIS — F10129 Alcohol abuse with intoxication, unspecified: Secondary | ICD-10-CM | POA: Diagnosis not present

## 2016-03-29 ENCOUNTER — Telehealth: Payer: Self-pay | Admitting: Family Medicine

## 2016-03-29 DIAGNOSIS — Z139 Encounter for screening, unspecified: Secondary | ICD-10-CM

## 2016-03-29 NOTE — Telephone Encounter (Signed)
Pt needs copy of shot record, call when ready  Chart sent back to the nurses

## 2016-03-29 NOTE — Telephone Encounter (Signed)
Pt states he will do bloodwork at his job. Vaccine record ready for pick up.

## 2016-03-29 NOTE — Telephone Encounter (Signed)
Vaccine record ready for pickup. Pt also needs hep b titer, varicella titer, and quantiferon gold or tspot. Is this ok to order.  Please see form in folder.

## 2016-03-29 NOTE — Telephone Encounter (Signed)
Sure it is okay to order this

## 2016-04-06 ENCOUNTER — Encounter: Payer: Self-pay | Admitting: Family Medicine

## 2016-04-06 ENCOUNTER — Ambulatory Visit (INDEPENDENT_AMBULATORY_CARE_PROVIDER_SITE_OTHER): Payer: 59 | Admitting: Family Medicine

## 2016-04-06 VITALS — BP 122/88 | Temp 100.8°F | Ht 72.0 in | Wt 170.0 lb

## 2016-04-06 DIAGNOSIS — J020 Streptococcal pharyngitis: Secondary | ICD-10-CM | POA: Diagnosis not present

## 2016-04-06 DIAGNOSIS — J029 Acute pharyngitis, unspecified: Secondary | ICD-10-CM | POA: Diagnosis not present

## 2016-04-06 LAB — POCT RAPID STREP A (OFFICE): Rapid Strep A Screen: POSITIVE — AB

## 2016-04-06 MED ORDER — AMOXICILLIN 500 MG PO TABS: 500.0000 mg | ORAL_TABLET | Freq: Three times a day (TID) | ORAL | 0 refills | Status: DC

## 2016-04-06 NOTE — Progress Notes (Signed)
   Subjective:    Patient ID: Verna CzechHarold A Friedli, male    DOB: 02/26/1991, 25 y.o.   MRN: [id number]  Fever   This is a new problem. Episode onset: 2 days. Associated symptoms include headaches and a sore throat. He has tried nothing for the symptoms.  Significant mild headache sore throat low energy some fever chills denies wheezing difficulty breathing vomiting diarrhea    Review of Systems  Constitutional: Positive for fever.  HENT: Positive for sore throat.   Neurological: Positive for headaches.       Objective:   Physical Exam  Lungs are clear hearts regular throat erythematous with enlarged tonsils with exudate neck supple minimal adenopathy      Assessment & Plan:  Febrile illness Strep throat Antibiotics prescribed warning signs discussed Follow-up if progressive troubles Warning signs discussed. Staying inside next few days no work.

## 2016-04-06 NOTE — Patient Instructions (Signed)

## 2016-07-04 ENCOUNTER — Ambulatory Visit: Payer: 59 | Admitting: Family Medicine

## 2016-07-13 DIAGNOSIS — R079 Chest pain, unspecified: Secondary | ICD-10-CM | POA: Diagnosis not present

## 2016-07-13 DIAGNOSIS — R21 Rash and other nonspecific skin eruption: Secondary | ICD-10-CM | POA: Diagnosis not present

## 2016-07-13 DIAGNOSIS — F17220 Nicotine dependence, chewing tobacco, uncomplicated: Secondary | ICD-10-CM | POA: Insufficient documentation

## 2016-07-13 DIAGNOSIS — R42 Dizziness and giddiness: Secondary | ICD-10-CM | POA: Diagnosis not present

## 2016-07-13 DIAGNOSIS — F17210 Nicotine dependence, cigarettes, uncomplicated: Secondary | ICD-10-CM | POA: Insufficient documentation

## 2016-07-14 ENCOUNTER — Encounter (HOSPITAL_COMMUNITY): Payer: Self-pay | Admitting: *Deleted

## 2016-07-14 ENCOUNTER — Emergency Department (HOSPITAL_COMMUNITY)
Admission: EM | Admit: 2016-07-14 | Discharge: 2016-07-14 | Disposition: A | Discharge status: Home / Self Care | Payer: 59 | Attending: Emergency Medicine | Admitting: Emergency Medicine

## 2016-07-14 ENCOUNTER — Emergency Department (HOSPITAL_COMMUNITY): Payer: 59

## 2016-07-14 DIAGNOSIS — R42 Dizziness and giddiness: Secondary | ICD-10-CM

## 2016-07-14 DIAGNOSIS — R079 Chest pain, unspecified: Secondary | ICD-10-CM | POA: Diagnosis not present

## 2016-07-14 DIAGNOSIS — F17220 Nicotine dependence, chewing tobacco, uncomplicated: Secondary | ICD-10-CM | POA: Diagnosis not present

## 2016-07-14 DIAGNOSIS — F17210 Nicotine dependence, cigarettes, uncomplicated: Secondary | ICD-10-CM | POA: Diagnosis not present

## 2016-07-14 DIAGNOSIS — R21 Rash and other nonspecific skin eruption: Secondary | ICD-10-CM | POA: Diagnosis not present

## 2016-07-14 DIAGNOSIS — R05 Cough: Secondary | ICD-10-CM | POA: Diagnosis not present

## 2016-07-14 LAB — COMPREHENSIVE METABOLIC PANEL
ALT: 15 U/L — ABNORMAL LOW (ref 17–63)
AST: 16 U/L (ref 15–41)
Albumin: 4.5 g/dL (ref 3.5–5.0)
Alkaline Phosphatase: 52 U/L (ref 38–126)
Anion gap: 8 (ref 5–15)
BUN: 16 mg/dL (ref 6–20)
CO2: 25 mmol/L (ref 22–32)
Calcium: 9.3 mg/dL (ref 8.9–10.3)
Chloride: 105 mmol/L (ref 101–111)
Creatinine, Ser: 0.83 mg/dL (ref 0.61–1.24)
GFR calc Af Amer: >60 mL/min (ref >60)
GFR calc non Af Amer: >60 mL/min (ref >60)
Glucose, Bld: 105 mg/dL — ABNORMAL HIGH (ref 65–99)
Potassium: 3.8 mmol/L (ref 3.5–5.1)
Sodium: 138 mmol/L (ref 135–145)
Total Bilirubin: 0.4 mg/dL (ref 0.3–1.2)
Total Protein: 7.5 g/dL (ref 6.5–8.1)

## 2016-07-14 LAB — CBC WITH DIFFERENTIAL/PLATELET
Basophils Absolute: 0.1 10*3/uL (ref 0.0–0.1)
Basophils Relative: 1 %
Eosinophils Absolute: 0.5 10*3/uL (ref 0.0–0.7)
Eosinophils Relative: 5 %
HCT: 47.1 % (ref 39.0–52.0)
Hemoglobin: 16.3 g/dL (ref 13.0–17.0)
Lymphocytes Relative: 36 %
Lymphs Abs: 3.2 10*3/uL (ref 0.7–4.0)
MCH: 31.5 pg (ref 26.0–34.0)
MCHC: 34.6 g/dL (ref 30.0–36.0)
MCV: 91.1 fL (ref 78.0–100.0)
Monocytes Absolute: 0.6 10*3/uL (ref 0.1–1.0)
Monocytes Relative: 7 %
Neutro Abs: 4.6 10*3/uL (ref 1.7–7.7)
Neutrophils Relative %: 51 %
Platelets: 165 10*3/uL (ref 150–400)
RBC: 5.17 MIL/uL (ref 4.22–5.81)
RDW: 12.1 % (ref 11.5–15.5)
WBC: 8.9 10*3/uL (ref 4.0–10.5)

## 2016-07-14 LAB — TROPONIN I: Troponin I: <0.03 ng/mL (ref <0.03)

## 2016-07-14 NOTE — ED Triage Notes (Addendum)
Pt reports dizziness on & off for the past 2 to 3 weeks. Worse tonight. Pt ambulated from waiting room to treatment room w/ no issues w/ gait.

## 2016-07-14 NOTE — ED Provider Notes (Signed)
Patient was given his test results which were all normal.   Results for orders placed or performed during the hospital encounter of 07/14/16  CBC with Differential/Platelet  Result Value Ref Range   WBC 8.9 4.0 - 10.5 K/uL   RBC 5.17 4.22 - 5.81 MIL/uL   Hemoglobin 16.3 13.0 - 17.0 g/dL   HCT 16.147.1 09.639.0 - 04.552.0 %   MCV 91.1 78.0 - 100.0 fL   MCH 31.5 26.0 - 34.0 pg   MCHC 34.6 30.0 - 36.0 g/dL   RDW 40.912.1 81.111.5 - 91.415.5 %   Platelets 165 150 - 400 K/uL   Neutrophils Relative % 51 %   Neutro Abs 4.6 1.7 - 7.7 K/uL   Lymphocytes Relative 36 %   Lymphs Abs 3.2 0.7 - 4.0 K/uL   Monocytes Relative 7 %   Monocytes Absolute 0.6 0.1 - 1.0 K/uL   Eosinophils Relative 5 %   Eosinophils Absolute 0.5 0.0 - 0.7 K/uL   Basophils Relative 1 %   Basophils Absolute 0.1 0.0 - 0.1 K/uL  Comprehensive metabolic panel  Result Value Ref Range   Sodium 138 135 - 145 mmol/L   Potassium 3.8 3.5 - 5.1 mmol/L   Chloride 105 101 - 111 mmol/L   CO2 25 22 - 32 mmol/L   Glucose, Bld 105 (H) 65 - 99 mg/dL   BUN 16 6 - 20 mg/dL   Creatinine, Ser 7.820.83 0.61 - 1.24 mg/dL   Calcium 9.3 8.9 - 95.610.3 mg/dL   Total Protein 7.5 6.5 - 8.1 g/dL   Albumin 4.5 3.5 - 5.0 g/dL   AST 16 15 - 41 U/L   ALT 15 (L) 17 - 63 U/L   Alkaline Phosphatase 52 38 - 126 U/L   Total Bilirubin 0.4 0.3 - 1.2 mg/dL   GFR calc non Af Amer >60 >60 mL/min   GFR calc Af Amer >60 >60 mL/min   Anion gap 8 5 - 15  Troponin I  Result Value Ref Range   Troponin I <0.03 <0.03 ng/mL   Laboratory interpretation all normal   Dg Chest 2 View  Result Date: 07/14/2016 CLINICAL DATA:  Productive cough with intermittent chest pain. Intermittent upper back pain. EXAM: CHEST  2 VIEW COMPARISON:  04/25/2014 FINDINGS: The heart size and mediastinal contours are within normal limits. Both lungs are clear. The visualized skeletal structures are unremarkable. IMPRESSION: No active cardiopulmonary disease. Electronically Signed   By: Burman NievesWilliam  Stevens M.D.    On: 07/14/2016 01:31     ED ECG REPORT   Date: 07/14/2016  Rate: 68  Rhythm: normal sinus rhythm  QRS Axis: normal  Intervals: normal  ST/T Wave abnormalities: early repolarization  Conduction Disutrbances:LAE  Narrative Interpretation:   Old EKG Reviewed: none available  I have personally reviewed the EKG tracing and agree with the computerized printout as noted.   Medical screening examination/treatment/procedure(s) were conducted as a shared visit with non-physician practitioner(s) and myself.  I personally evaluated the patient during the encounter.    Devoria AlbeIva Quatavious Rossa, MD, Concha PyoFACEP     Vollie Aaron, MD 07/14/16 [phone]

## 2016-07-14 NOTE — ED Notes (Signed)
Pt alert & oriented x4, stable gait. Patient  given discharge instructions, paperwork & prescription(s). Patient verbalized understanding. Pt left department w/ no further questions. 

## 2016-07-14 NOTE — ED Notes (Signed)
Pt provided drink at this time  

## 2016-07-14 NOTE — ED Provider Notes (Signed)
[facility]-EMER[facility]ENCY DEPT Provider Note   CSN: [id number] Arrival date & time: 07/13/16  2356     History   Chief Complaint Chief Complaint  Patient presents with  . Dizziness    HPI [name] is a 25 y.o. male.  The history is provided by the patient. No language interpreter was used.  Dizziness  Quality:  Lightheadedness Severity:  Moderate Onset quality:  Gradual Duration:  3 weeks Timing:  Intermittent Progression:  Worsening Chronicity:  New Relieved by:  Nothing Worsened by:  Nothing Ineffective treatments:  None tried Associated symptoms: chest pain   Associated symptoms: no headaches   Risk factors: no anemia   Pt reports he has been having dizzy episodes on and off for 3 weeks.  Pt reports today he had chest pain.  Pt also concerned about a knot on right arm and a rash on left arm.  Pt reports last week his left arm became red and then resolved.  Pt reports his father has Burgers disease.  Pt reports he has a history of chewing a lot of tobacco and smoking a lot recently  Past Medical History:  Diagnosis Date  . Staph infection     Patient Active Problem List   Diagnosis Date Noted  . Abrasions of multiple sites 04/26/2014  . C7 cervical fracture (HCC) 04/26/2014  . Closed displaced fracture of neck of left fifth metacarpal bone 04/26/2014  . Maxillary fracture (HCC) 04/26/2014  . Pain, abdominal, nonspecific 02/05/2013  . Allergic rhinitis 12/02/2012  . CLOSED FRACTURE OF SHAFT OF METACARPAL BONE 12/12/2007    Past Surgical History:  Procedure Laterality Date  . HAND SURGERY    . MOUTH SURGERY         Home Medications    Prior to Admission medications   Medication Sig Start Date End Date Taking? Authorizing Provider  amoxicillin (AMOXIL) 500 MG tablet Take 1 tablet (500 mg total) by mouth 3 (three) times daily. 04/06/16   Babs SciaraScott A Luking, MD    Family History No family history on file.  Social History Social History  Substance Use  Topics  . Smoking status: Current Every Day Smoker    Packs/day: 1.00    Types: Cigarettes  . Smokeless tobacco: Current User    Types: Chew  . Alcohol use 0.0 oz/week     Allergies   Patient has no known allergies.   Review of Systems Review of Systems  Cardiovascular: Positive for chest pain.  Neurological: Positive for dizziness. Negative for headaches.  All other systems reviewed and are negative.    Physical Exam Updated Vital Signs BP 125/87 (BP Location: Left Arm)   Pulse 87   Temp 98 F (36.7 C) (Oral)   Resp 18   Ht 6' (1.829 m)   Wt 79.4 kg   SpO2 99%   BMI 23.73 kg/m   Physical Exam  Constitutional: He appears well-developed and well-nourished.  HENT:  Head: Normocephalic and atraumatic.  Eyes: Conjunctivae are normal.  Neck: Neck supple.  Cardiovascular: Normal rate and regular rhythm.   No murmur heard. Pulmonary/Chest: Effort normal and breath sounds normal. No respiratory distress.  Abdominal: Soft. There is no tenderness.  Musculoskeletal: He exhibits no edema.  2mm nodular area right arm in antecubital area.  Neurological: He is alert.  Skin: Skin is warm and dry. Rash noted.  Small red pimples left arm,  (inflammed hair follicle or bites)   Psychiatric: He has a normal mood and affect.  Nursing note  and vitals reviewed.    ED Treatments / Results  Labs (all labs ordered are listed, but only abnormal results are displayed) Labs Reviewed - No data to display  EKG  EKG Interpretation None       Radiology No results found.  Procedures Procedures (including critical care time)  Medications Ordered in ED Medications - No data to display   Initial Impression / Assessment and Plan / ED Course  I have reviewed the triage vital signs and the nursing notes.  Pertinent labs & imaging results that were available during my care of the patient were reviewed by me and considered in my medical decision making (see chart for  details).  Clinical Course     Pt's care turned over to Dr. Lynelle DoctorKnapp at 1:00am,  Labs and xray pending. Final Clinical Impressions(s) / ED Diagnoses   Final diagnoses:  Dizziness    New Prescriptions Discharge Medication List as of 07/14/2016  1:50 AM    An After Visit Summary was printed and given to the patient.   Lonia SkinnerLeslie K East RandolphSofia, PA-C 07/14/16 Flossie [id number]    Devoria AlbeIva Knapp, MD 07/17/16 2258

## 2016-07-14 NOTE — Discharge Instructions (Addendum)
Your tests in the ED tonight area all normal. Please follow up with Dr Phillips OdorGolding.

## 2016-07-17 ENCOUNTER — Encounter: Payer: Self-pay | Admitting: Family Medicine

## 2016-07-17 ENCOUNTER — Ambulatory Visit (INDEPENDENT_AMBULATORY_CARE_PROVIDER_SITE_OTHER): Payer: 59 | Admitting: Family Medicine

## 2016-07-17 VITALS — BP 118/72 | Temp 98.3°F | Ht 72.0 in | Wt 176.6 lb

## 2016-07-17 DIAGNOSIS — L729 Follicular cyst of the skin and subcutaneous tissue, unspecified: Secondary | ICD-10-CM | POA: Diagnosis not present

## 2016-07-17 DIAGNOSIS — R519 Headache, unspecified: Secondary | ICD-10-CM

## 2016-07-17 DIAGNOSIS — R51 Headache: Secondary | ICD-10-CM

## 2016-07-17 DIAGNOSIS — F1590 Other stimulant use, unspecified, uncomplicated: Secondary | ICD-10-CM

## 2016-07-17 DIAGNOSIS — Z789 Other specified health status: Secondary | ICD-10-CM

## 2016-07-17 NOTE — Patient Instructions (Signed)
Reduce your caffiene gradually over the next 2 to 3 weeks this should help your headaches go away.  Call us if any issues

## 2016-07-17 NOTE — Progress Notes (Signed)
   Subjective:    Patient ID: [name], male    DOB: 06/04/1991, 25 y.o.   MRN: [id number]  HPI  Patient here to discuss headaches for 3 weeks. Patient states that off and on her gets headaches and it makes him swimmy headed. Patient states he went to ER Thursday with these symptoms. Patient states Thursday when he had the symptoms he got nervous and shaky and chest hurt. ER did cardiac work up which was normal. He does regular physical activity with his job denies any trouble with it He relates he drinks several metal dews per day He also states that he drinks couple cups of coffee in the morning He describes the headaches as aching in the back of the head last about 5-10 minutes then goes away no double vision blurry vision nausea or vomiting associated with it no unilateral numbness or weakness Review of Systems  Constitutional: Negative for fatigue and fever.  HENT: Negative for congestion.   Respiratory: Negative for cough.   Cardiovascular: Negative for chest pain.  Gastrointestinal: Negative for abdominal pain.  Neurological: Positive for dizziness, light-headedness and headaches. Negative for seizures and syncope.       Objective:   Physical Exam  Constitutional: He appears well-nourished. No distress.  Cardiovascular: Normal rate, regular rhythm and normal heart sounds.   No murmur heard. Pulmonary/Chest: Effort normal and breath sounds normal. No respiratory distress.  Musculoskeletal: He exhibits no edema.  Lymphadenopathy:    He has no cervical adenopathy.  Neurological: He is alert.  Psychiatric: His behavior is normal.  Vitals reviewed.   Patient has a small subcutaneous cyst near the right elbow in the flexor portion if it starts getting larger he needs a let us know we will refer him to have it removed      Assessment & Plan:  Headaches-just started over the past 3 weeks. Does not appear to be migraines. I believe it's related to the excessive amount of  caffeine he's been taking in over the past few weeks I encouraged patient to gradually taper off the caffeine over the next few weeks if the headaches do not get better with this approach he needs to let us know. Tylenol or ibuprofen when necessary. Keep well hydrated with water eat regular

## 2016-09-19 ENCOUNTER — Encounter: Payer: Self-pay | Admitting: Family Medicine

## 2016-11-20 ENCOUNTER — Ambulatory Visit (INDEPENDENT_AMBULATORY_CARE_PROVIDER_SITE_OTHER): Payer: 59 | Admitting: Nurse Practitioner

## 2016-11-20 ENCOUNTER — Encounter: Payer: Self-pay | Admitting: Nurse Practitioner

## 2016-11-20 VITALS — BP 130/88 | Temp 98.4°F | Ht 72.0 in | Wt 178.2 lb

## 2016-11-20 DIAGNOSIS — J069 Acute upper respiratory infection, unspecified: Secondary | ICD-10-CM

## 2016-11-20 DIAGNOSIS — B9689 Other specified bacterial agents as the cause of diseases classified elsewhere: Secondary | ICD-10-CM

## 2016-11-20 MED ORDER — AZITHROMYCIN 250 MG PO TABS
ORAL_TABLET | ORAL | 0 refills | Status: DC
Start: 2016-11-20 — End: 2016-12-11

## 2016-11-21 ENCOUNTER — Telehealth: Payer: Self-pay | Admitting: Nurse Practitioner

## 2016-11-21 NOTE — Telephone Encounter (Signed)
Pt called back stating that he forgot to tell [name] that when he starts getting his headaches it is normally before he eats. Pt states that after he eats it starts feeling better. Please advise.

## 2016-11-22 ENCOUNTER — Encounter: Payer: Self-pay | Admitting: Nurse Practitioner

## 2016-11-22 NOTE — Telephone Encounter (Signed)
Left message return call 11/22/2016 

## 2016-11-22 NOTE — Telephone Encounter (Signed)
With a pattern like this, it seems to be related to blood sugar. Does not mean he is diabetic but could have low blood sugars at times. First, do not skip meals. Second, eat a protein bar as a snack if needed between meals. Recommend recheck if headaches persist or worsen.

## 2016-11-22 NOTE — Progress Notes (Signed)
Subjective:  Presents for c/o congestion x 1 week. No fever. Slight sore throat in the am. Headache. Frequent cough producing green sputum. No ear pain. No wheezing. Smoking about 3 cigarettes per day. Uses one can of dip per day.   Objective:   BP 130/88   Temp 98.4 F (36.9 C) (Oral)   Ht 6' (1.829 m)   Wt 178 lb 4 oz (80.9 kg)   BMI 24.18 kg/m  NAD. Alert, oriented. TMs retracted, no erythema. Pharynx injected with green PND noted. Neck supple with mild anterior adenopathy. Lungs clear. Heart RRR.   Assessment:  Bacterial upper respiratory infection    Plan:   Meds ordered this encounter  Medications  . azithromycin (ZITHROMAX Z-PAK) 250 MG tablet    Sig: Take 2 tablets (500 mg) on  Day 1,  followed by 1 tablet (250 mg) once daily on Days 2 through 5.    Dispense:  6 each    Refill:  0    Order Specific Question:   Supervising Provider    Answer:   Merlyn Albert [[id number]]   OTC meds as directed. Warning signs reviewed. Call back if worsens or persists. Discussed risks associated with tobacco use.

## 2016-11-27 NOTE — Telephone Encounter (Signed)
Left message to return call 

## 2016-11-27 NOTE — Telephone Encounter (Signed)
Discussed with patient. Patient advised With a pattern like this, it seems to be related to blood sugar. Does not mean he is diabetic but could have low blood sugars at times. First, do not skip meals. Second, eat a protein bar as a snack if needed between meals. Recommend recheck if headaches persist or worsen. Patient verbalized understanding.

## 2016-12-11 ENCOUNTER — Ambulatory Visit (INDEPENDENT_AMBULATORY_CARE_PROVIDER_SITE_OTHER): Payer: 59 | Admitting: Family Medicine

## 2016-12-11 ENCOUNTER — Encounter: Payer: Self-pay | Admitting: Family Medicine

## 2016-12-11 VITALS — BP 106/76 | Temp 98.0°F | Ht 72.0 in | Wt 181.1 lb

## 2016-12-11 DIAGNOSIS — J019 Acute sinusitis, unspecified: Secondary | ICD-10-CM | POA: Diagnosis not present

## 2016-12-11 MED ORDER — AMOXICILLIN-POT CLAVULANATE 875-125 MG PO TABS: 1.0000 | ORAL_TABLET | Freq: Two times a day (BID) | ORAL | 0 refills | Status: DC

## 2016-12-11 NOTE — Progress Notes (Signed)
   Subjective:    Patient ID: [name], male    DOB: 1991-02-22, 26 y.o.   MRN: [id number]  Sore Throat   This is a new problem. The current episode started in the past 7 days. Associated symptoms include ear pain and headaches. Associated symptoms comments: Runny nose .   Patient states no other concerns this visit.   Pos smoker  rxed with zith got a bit better  Now more sick both in chest and headache  Review of Systems  HENT: Positive for ear pain.   Neurological: Positive for headaches.       Objective:   Physical Exam Alert, mild malaise. Hydration good Vitals stable. frontal/ maxillary tenderness evident positive nasal congestion. pharynx normal neck supple  lungs clear/no crackles or wheezes. heart regular in rhythm        Assessment & Plan:  Impression rhinosinusitis likely post viral, discussed with patient. plan antibiotics prescribed. Questions answered. Symptomatic care discussed. warning signs discussed. WSL

## 2016-12-19 ENCOUNTER — Telehealth: Payer: Self-pay | Admitting: Family Medicine

## 2016-12-19 MED ORDER — CEFPROZIL 500 MG PO TABS: 500.0000 mg | ORAL_TABLET | Freq: Two times a day (BID) | ORAL | 0 refills | Status: DC

## 2016-12-19 NOTE — Telephone Encounter (Signed)
Prescription sent electronically to pharmacy. Patient notified. 
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
## 2016-12-19 NOTE — Telephone Encounter (Signed)
At this point I would recommend Cefzil 500 mg 1 twice a day for 7 days by then he should be well follow-up if problems

## 2017-01-10 ENCOUNTER — Ambulatory Visit (INDEPENDENT_AMBULATORY_CARE_PROVIDER_SITE_OTHER): Payer: 59 | Admitting: Family Medicine

## 2017-01-10 ENCOUNTER — Encounter: Payer: Self-pay | Admitting: Family Medicine

## 2017-01-10 VITALS — BP 120/80 | Temp 98.7°F | Ht 72.0 in | Wt 170.5 lb

## 2017-01-10 DIAGNOSIS — J019 Acute sinusitis, unspecified: Secondary | ICD-10-CM | POA: Diagnosis not present

## 2017-01-10 DIAGNOSIS — J683 Other acute and subacute respiratory conditions due to chemicals, gases, fumes and vapors: Secondary | ICD-10-CM

## 2017-01-10 MED ORDER — CLARITHROMYCIN 500 MG PO TABS: ORAL_TABLET | ORAL | 0 refills | Status: DC

## 2017-01-10 MED ORDER — ALBUTEROL SULFATE HFA 108 (90 BASE) MCG/ACT IN AERS: 2.0000 | INHALATION_SPRAY | Freq: Four times a day (QID) | RESPIRATORY_TRACT | 2 refills | Status: DC | PRN

## 2017-01-10 MED ORDER — PREDNISONE 10 MG PO TABS: ORAL_TABLET | ORAL | 0 refills | Status: DC

## 2017-01-10 NOTE — Progress Notes (Signed)
   Subjective:    Patient ID: [name], male    DOB: 04/23/1991, 26 y.o.   MRN: [id number]  Cough  This is a new problem. The current episode started 1 to 4 weeks ago. Associated symptoms include ear pain, rhinorrhea, a sore throat and wheezing.   Cough bad day and night  Wheezing a t times  Still smoliking  Pos prod cough   Gunky at times  Patient states no other concerns this visit.   Review of Systems  HENT: Positive for ear pain, rhinorrhea and sore throat.   Respiratory: Positive for cough and wheezing.        Objective:   Physical Exam  Alert, mild malaise. Hydration good Vitals stable. frontal/ maxillary tenderness evident positive nasal congestion. pharynx normal neck supple  lungs clear/no crackles  wheezes. heart regular in rhythm Rd       Assessment & Plan:  ImpLess albuterol added for reactive airway component. P patient asked to quit smoking immediately d wheezesos milression rhinosinusitis likely post viral, discussed with patient. plan antibiotics prescribed. Questions answered. Symptomatic care discussed. warning signs discussed. WSLalbuterol rxed

## 2017-02-07 ENCOUNTER — Encounter: Payer: Self-pay | Admitting: Family Medicine

## 2017-02-07 ENCOUNTER — Ambulatory Visit (INDEPENDENT_AMBULATORY_CARE_PROVIDER_SITE_OTHER): Payer: 59 | Admitting: Family Medicine

## 2017-02-07 ENCOUNTER — Other Ambulatory Visit: Payer: Self-pay | Admitting: *Deleted

## 2017-02-07 ENCOUNTER — Ambulatory Visit (HOSPITAL_COMMUNITY)
Admission: RE | Admit: 2017-02-07 | Discharge: 2017-02-07 | Disposition: A | Discharge status: Home / Self Care | Payer: 59 | Source: Ambulatory Visit | Attending: Family Medicine | Admitting: Family Medicine

## 2017-02-07 VITALS — BP 130/90 | Ht 72.0 in | Wt 170.1 lb

## 2017-02-07 DIAGNOSIS — S0230XA Fracture of orbital floor, unspecified side, initial encounter for closed fracture: Secondary | ICD-10-CM

## 2017-02-07 DIAGNOSIS — J342 Deviated nasal septum: Secondary | ICD-10-CM | POA: Insufficient documentation

## 2017-02-07 DIAGNOSIS — S0592XA Unspecified injury of left eye and orbit, initial encounter: Secondary | ICD-10-CM | POA: Diagnosis not present

## 2017-02-07 DIAGNOSIS — S0232XA Fracture of orbital floor, left side, initial encounter for closed fracture: Secondary | ICD-10-CM

## 2017-02-07 DIAGNOSIS — R220 Localized swelling, mass and lump, head: Secondary | ICD-10-CM | POA: Diagnosis not present

## 2017-02-07 DIAGNOSIS — X58XXXA Exposure to other specified factors, initial encounter: Secondary | ICD-10-CM | POA: Diagnosis not present

## 2017-02-07 MED ORDER — AMOXICILLIN 500 MG PO CAPS: 500.0000 mg | ORAL_CAPSULE | Freq: Three times a day (TID) | ORAL | 0 refills | Status: DC

## 2017-02-07 MED ORDER — NAPROXEN 500 MG PO TABS: ORAL_TABLET | ORAL | 0 refills | Status: DC
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
## 2017-02-08 ENCOUNTER — Ambulatory Visit (INDEPENDENT_AMBULATORY_CARE_PROVIDER_SITE_OTHER): Payer: 59 | Admitting: Otolaryngology

## 2017-02-12 ENCOUNTER — Ambulatory Visit (HOSPITAL_COMMUNITY): Payer: 59

## 2017-02-13 ENCOUNTER — Ambulatory Visit (HOSPITAL_COMMUNITY): Payer: 59

## 2017-02-27 ENCOUNTER — Telehealth: Payer: Self-pay | Admitting: Family Medicine

## 2017-02-27 NOTE — Telephone Encounter (Signed)
Tried to call no answer. Patient had CT scan scheduled for 02/13/17. CT was never completed. Calling to see why patient did not have CT scan completed.

## 2017-03-02 NOTE — Telephone Encounter (Signed)
Pt called and canceled ct scan because of money issues. Pt states he is fine now and not having any issues. Does not feel like he needs it now.  

## 2017-03-06 NOTE — Telephone Encounter (Signed)
Certainly it is the patient's prerogative to not get the CAT scan. It is our recommendation to get the scan because it was recommended by radiology. The CAT scan was recommended to make sure there is no damage that would require any type of intervention. Typically the hospital has a ability to cut the cost for those who do not have insurance if he goes through the business department and discuss it with him. If the patient decides to skip the CAT scan I certainly understand

## 2017-03-06 NOTE — Telephone Encounter (Signed)
Telephone call no answer- the mailbox is full

## 2017-03-14 NOTE — Telephone Encounter (Signed)
Pt called and canceled ct scan because of money issues. Pt states he is fine now and not having any issues. Does not feel like he needs it now.

## 2017-05-03 ENCOUNTER — Ambulatory Visit (INDEPENDENT_AMBULATORY_CARE_PROVIDER_SITE_OTHER): Payer: 59 | Admitting: Family Medicine

## 2017-05-03 ENCOUNTER — Encounter: Payer: Self-pay | Admitting: Family Medicine

## 2017-05-03 VITALS — BP 144/90 | Temp 98.6°F | Ht 72.0 in | Wt 180.0 lb

## 2017-05-03 DIAGNOSIS — A4902 Methicillin resistant Staphylococcus aureus infection, unspecified site: Secondary | ICD-10-CM | POA: Diagnosis not present

## 2017-05-03 DIAGNOSIS — T63301A Toxic effect of unspecified spider venom, accidental (unintentional), initial encounter: Secondary | ICD-10-CM | POA: Diagnosis not present

## 2017-05-03 DIAGNOSIS — L089 Local infection of the skin and subcutaneous tissue, unspecified: Secondary | ICD-10-CM

## 2017-05-03 MED ORDER — DOXYCYCLINE HYCLATE 100 MG PO TABS: 100.0000 mg | ORAL_TABLET | Freq: Two times a day (BID) | ORAL | 0 refills | Status: DC

## 2017-05-03 MED ORDER — MUPIROCIN 2 % EX OINT: TOPICAL_OINTMENT | CUTANEOUS | 0 refills | Status: DC

## 2017-05-03 MED ORDER — PREDNISONE 20 MG PO TABS: ORAL_TABLET | ORAL | 0 refills | Status: DC

## 2017-05-03 NOTE — Progress Notes (Signed)
   Subjective:    Patient ID: Brian CzechHarold A Fung, male    DOB: 12/26/1990, 26 y.o.   MRN: [id number]  HPIabscess on abdomen. Came up two days ago. Thinks he got bit by a spider while out in the woods hunting. Used peroxide on it.  Patient relates he was out hunting in the woods he thinks he got bit by a spider on the abdomen started off as a small red spot then created an ulcer but has not gotten bigger in the past couple days initially was sore now it's doing better Bump on right side on head. Sore when it first came up. No pain now. Came up one week ago. Patient relates he area on side of his head is somewhat tender to the touch denies fever chills sweats.  Review of Systems  Constitutional: Negative for activity change, fatigue and fever.  Respiratory: Negative for cough and shortness of breath.   Cardiovascular: Negative for chest pain and leg swelling.  Neurological: Negative for headaches.       Objective:   Physical Exam  Constitutional: He appears well-nourished. No distress.  Cardiovascular: Normal rate, regular rhythm and normal heart sounds.   No murmur heard. Pulmonary/Chest: Effort normal and breath sounds normal. No respiratory distress.  Musculoskeletal: He exhibits no edema.  Lymphadenopathy:    He has no cervical adenopathy.  Neurological: He is alert.  Skin:  Patient does have a small ulcer area on his abdomen 3 mm in diameter healed over minimal redness, also has a sore on the right side of his head consistent with early MRSA  Psychiatric: His behavior is normal.  Vitals reviewed.         Assessment & Plan:  Spider bite-I doubt that it is brown recluse causes not getting bigger but it does bear watching use Bactroban twice a day according to research dapsone not indicated  Probable MRSA on the side of his head doxycycline twice a day ongoing for the next 7-10 days warm compresses frequently follow-up of problems

## 2017-08-17 ENCOUNTER — Encounter: Payer: Self-pay | Admitting: Family Medicine

## 2017-08-24 ENCOUNTER — Ambulatory Visit (INDEPENDENT_AMBULATORY_CARE_PROVIDER_SITE_OTHER): Payer: 59 | Admitting: Family Medicine

## 2017-08-24 ENCOUNTER — Encounter: Payer: Self-pay | Admitting: Family Medicine

## 2017-08-24 VITALS — BP 132/90 | Temp 98.4°F | Ht 72.0 in | Wt 185.0 lb

## 2017-08-24 DIAGNOSIS — K649 Unspecified hemorrhoids: Secondary | ICD-10-CM | POA: Diagnosis not present

## 2017-08-24 NOTE — Patient Instructions (Signed)
Hemorrhoids Hemorrhoids are swollen veins in and around the rectum or anus. There are two types of hemorrhoids:  Internal hemorrhoids. These occur in the veins that are just inside the rectum. They may poke through to the outside and become irritated and painful.  External hemorrhoids. These occur in the veins that are outside of the anus and can be felt as a painful swelling or hard lump near the anus.  Most hemorrhoids do not cause serious problems, and they can be managed with home treatments such as diet and lifestyle changes. If home treatments do not help your symptoms, procedures can be done to shrink or remove the hemorrhoids. What are the causes? This condition is caused by increased pressure in the anal area. This pressure may result from various things, including:  Constipation.  Straining to have a bowel movement.  Diarrhea.  Pregnancy.  Obesity.  Sitting for long periods of time.  Heavy lifting or other activity that causes you to strain.  Anal sex.  What are the signs or symptoms? Symptoms of this condition include:  Pain.  Anal itching or irritation.  Rectal bleeding.  Leakage of stool (feces).  Anal swelling.  One or more lumps around the anus.  How is this diagnosed? This condition can often be diagnosed through a visual exam. Other exams or tests may also be done, such as:  Examination of the rectal area with a gloved hand (digital rectal exam).  Examination of the anal canal using a small tube (anoscope).  A blood test, if you have lost a significant amount of blood.  A test to look inside the colon (sigmoidoscopy or colonoscopy).  How is this treated? This condition can usually be treated at home. However, various procedures may be done if dietary changes, lifestyle changes, and other home treatments do not help your symptoms. These procedures can help make the hemorrhoids smaller or remove them completely. Some of these procedures involve  surgery, and others do not. Common procedures include:  Rubber band ligation. Rubber bands are placed at the base of the hemorrhoids to cut off the blood supply to them.  Sclerotherapy. Medicine is injected into the hemorrhoids to shrink them.  Infrared coagulation. A type of light energy is used to get rid of the hemorrhoids.  Hemorrhoidectomy surgery. The hemorrhoids are surgically removed, and the veins that supply them are tied off.  Stapled hemorrhoidopexy surgery. A circular stapling device is used to remove the hemorrhoids and use staples to cut off the blood supply to them.  Follow these instructions at home: Eating and drinking  Eat foods that have a lot of fiber in them, such as whole grains, beans, nuts, fruits, and vegetables. Ask your health care provider about taking products that have added fiber (fiber supplements).  Drink enough fluid to keep your urine clear or pale yellow. Managing pain and swelling  Take warm sitz baths for 20 minutes, 3-4 times a day to ease pain and discomfort.  If directed, apply ice to the affected area. Using ice packs between sitz baths may be helpful. ? Put ice in a plastic bag. ? Place a towel between your skin and the bag. ? Leave the ice on for 20 minutes, 2-3 times a day. General instructions  Take over-the-counter and prescription medicines only as told by your health care provider.  Use medicated creams or suppositories as told.  Exercise regularly.  Go to the bathroom when you have the urge to have a bowel movement. Do not wait.    Avoid straining to have bowel movements.  Keep the anal area dry and clean. Use wet toilet paper or moist towelettes after a bowel movement.  Do not sit on the toilet for long periods of time. This increases blood pooling and pain. Contact a health care provider if:  You have increasing pain and swelling that are not controlled by treatment or medicine.  You have uncontrolled bleeding.  You  have difficulty having a bowel movement, or you are unable to have a bowel movement.  You have pain or inflammation outside the area of the hemorrhoids. This information is not intended to replace advice given to you by your health care provider. Make sure you discuss any questions you have with your health care provider. Document Released: 08/04/2000 Document Revised: 01/05/2016 Document Reviewed: 04/21/2015 Elsevier Interactive Patient Education  2018 [employer] Inc[employer]  

## 2017-08-24 NOTE — Progress Notes (Signed)
   Subjective:    Patient ID: [name], male    DOB: 03/03/1991, 27 y.o.   MRN: [id number]  HPIPatient arrives today to get a hemorrhoid checked.  Denies any intermittent abdominal pain denies vomiting diarrhea bloody stools denies dysuria fever chills sweats had a hemorrhoid about 4 years ago went away on its own now reoccurred a few days ago painful tenderness tried Preparation H it seems to be doing some better   Review of Systems  Constitutional: Negative for activity change, fatigue and fever.  Respiratory: Negative for cough and shortness of breath.   Cardiovascular: Negative for chest pain and leg swelling.  Neurological: Negative for headaches.       Objective:   Physical Exam  Constitutional: He appears well-nourished.  Cardiovascular: Normal rate, regular rhythm and normal heart sounds.  No murmur heard. Pulmonary/Chest: Effort normal and breath sounds normal.  Abdominal: Soft. He exhibits no distension. There is no tenderness.  Musculoskeletal: He exhibits no edema.  Lymphadenopathy:    He has no cervical adenopathy.  Neurological: He is alert.  Psychiatric: His behavior is normal.  Vitals reviewed.  Does have a hemorrhoid it is thrombosed but is not severe should gradually get better       Assessment & Plan:  Hemorrhoid warm soaks and compresses as well as stool softeners and Preparation H if persistent trouble or if worse will refer to general surgery

## 2017-10-03 ENCOUNTER — Ambulatory Visit: Payer: 59 | Admitting: Nurse Practitioner

## 2018-02-05 ENCOUNTER — Encounter: Payer: Self-pay | Admitting: Family Medicine

## 2018-02-05 ENCOUNTER — Ambulatory Visit (INDEPENDENT_AMBULATORY_CARE_PROVIDER_SITE_OTHER): Payer: 59 | Admitting: Family Medicine

## 2018-02-05 VITALS — BP 132/76 | Ht 73.0 in | Wt 187.0 lb

## 2018-02-05 DIAGNOSIS — G250 Essential tremor: Secondary | ICD-10-CM | POA: Diagnosis not present

## 2018-02-05 DIAGNOSIS — Z0000 Encounter for general adult medical examination without abnormal findings: Secondary | ICD-10-CM | POA: Diagnosis not present

## 2018-02-05 NOTE — Progress Notes (Signed)
   Subjective:    Patient ID: [name], male    DOB: 06/15/1991, 27 y.o.   MRN: [id number]  HPI The patient comes in today for a wellness visit.  I did counsel young man about quitting smoking also staying away from chewing tobacco to prevent cancer plus also discussed with him  A review of their health history was completed.  A review of medications was also completed.  Any needed refills; not taking any meds  Eating habits: not health conscious  Falls/  MVA accidents in past few months: none  Regular exercise: yes work  [employer]arr[employer]'s clerkpecialist pt sees on regular basis: none  Preventative health issues were discussed.   Additional concerns: none    Review of Systems  Constitutional: Negative for activity change, appetite change and fever.  HENT: Negative for congestion and rhinorrhea.   Eyes: Negative for discharge.  Respiratory: Negative for cough and wheezing.   Cardiovascular: Negative for chest pain.  Gastrointestinal: Negative for abdominal pain, blood in stool and vomiting.  Genitourinary: Negative for difficulty urinating and frequency.  Musculoskeletal: Negative for neck pain.  Skin: Negative for rash.  Allergic/Immunologic: Negative for environmental allergies and food allergies.  Neurological: Negative for weakness and headaches.  Psychiatric/Behavioral: Negative for agitation.       Objective:   Physical Exam  Constitutional: He appears well-developed and well-nourished.  HENT:  Head: Normocephalic and atraumatic.  Right Ear: External ear normal.  Left Ear: External ear normal.  Nose: Nose normal.  Mouth/Throat: Oropharynx is clear and moist.  Eyes: Pupils are equal, round, and reactive to light. EOM are normal.  Neck: Normal range of motion. Neck supple. No thyromegaly present.  Cardiovascular: Normal rate, regular rhythm and normal heart sounds.  No murmur heard. Pulmonary/Chest: Effort normal and breath sounds normal. No respiratory distress. He has no  wheezes.  Abdominal: Soft. Bowel sounds are normal. He exhibits no distension and no mass. There is no tenderness.  Genitourinary: Penis normal.  Musculoskeletal: Normal range of motion. He exhibits no edema.  Lymphadenopathy:    He has no cervical adenopathy.  Neurological: He is alert. He exhibits normal muscle tone.  Skin: Skin is warm and dry. No erythema.  Psychiatric: He has a normal mood and affect. His behavior is normal. Judgment normal.  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 Importance of diet and exercise were discussed in detail.  In addition to this a discussion regarding safety was also covered. We also reviewed over immunizations and gave recommendations regarding current immunization needed for age.  In addition to this additional areas were also touched on including: Preventative health exams needed:  Colonoscopy not indicated  Patient was advised yearly wellness exam Find tremor if this gets worse he will consider medicine currently he does not want any medicine also recommended thyroid testing he would like to hold off on this

## 2018-02-05 NOTE — Patient Instructions (Signed)
Steps to Quit Smoking Smoking tobacco can be bad for your health. It can also affect almost every organ in your body. Smoking puts you and people around you at risk for many serious long-lasting (chronic) diseases. Quitting smoking is hard, but it is one of the best things that you can do for your health. It is never too late to quit. What are the benefits of quitting smoking? When you quit smoking, you lower your risk for getting serious diseases and conditions. They can include:  Lung cancer or lung disease.  Heart disease.  Stroke.  Heart attack.  Not being able to have children (infertility).  Weak bones (osteoporosis) and broken bones (fractures).  If you have coughing, wheezing, and shortness of breath, those symptoms may get better when you quit. You may also get sick less often. If you are pregnant, quitting smoking can help to lower your chances of having a baby of low birth weight. What can I do to help me quit smoking? Talk with your doctor about what can help you quit smoking. Some things you can do (strategies) include:  Quitting smoking totally, instead of slowly cutting back how much you smoke over a period of time.  Going to in-person counseling. You are more likely to quit if you go to many counseling sessions.  Using resources and support systems, such as: ? Online chats with a counselor. ? Phone quitlines. ? Printed self-help materials. ? Support groups or group counseling. ? Text messaging programs. ? Mobile phone apps or applications.  Taking medicines. Some of these medicines may have nicotine in them. If you are pregnant or breastfeeding, do not take any medicines to quit smoking unless your doctor says it is okay. Talk with your doctor about counseling or other things that can help you.  Talk with your doctor about using more than one strategy at the same time, such as taking medicines while you are also going to in-person counseling. This can help make  quitting easier. What things can I do to make it easier to quit? Quitting smoking might feel very hard at first, but there is a lot that you can do to make it easier. Take these steps:  Talk to your family and friends. Ask them to support and encourage you.  Call phone quitlines, reach out to support groups, or work with a counselor.  Ask people who smoke to not smoke around you.  Avoid places that make you want (trigger) to smoke, such as: ? Bars. ? Parties. ? Smoke-break areas at work.  Spend time with people who do not smoke.  Lower the stress in your life. Stress can make you want to smoke. Try these things to help your stress: ? Getting regular exercise. ? Deep-breathing exercises. ? Yoga. ? Meditating. ? Doing a body scan. To do this, close your eyes, focus on one area of your body at a time from head to toe, and notice which parts of your body are tense. Try to relax the muscles in those areas.  Download or buy apps on your mobile phone or tablet that can help you stick to your quit plan. There are many free apps, such as QuitGuide from the CDC (Centers for Disease Control and Prevention). You can find more support from smokefree.gov and other websites.  This information is not intended to replace advice given to you by your health care provider. Make sure you discuss any questions you have with your health care provider. Document Released: 06/03/2009 Document   Revised: 04/04/2016 Document Reviewed: 12/22/2014 Elsevier Interactive Patient Education  2018 [employer] Inc.  

## 2018-04-07 ENCOUNTER — Emergency Department (HOSPITAL_COMMUNITY)
Admission: EM | Admit: 2018-04-07 | Discharge: 2018-04-07 | Disposition: A | Discharge status: Home / Self Care | Payer: 59 | Attending: Emergency Medicine | Admitting: Emergency Medicine

## 2018-04-07 ENCOUNTER — Encounter (HOSPITAL_COMMUNITY): Payer: Self-pay | Admitting: Emergency Medicine

## 2018-04-07 ENCOUNTER — Other Ambulatory Visit: Payer: Self-pay

## 2018-04-07 DIAGNOSIS — R45851 Suicidal ideations: Secondary | ICD-10-CM | POA: Insufficient documentation

## 2018-04-07 DIAGNOSIS — F919 Conduct disorder, unspecified: Secondary | ICD-10-CM | POA: Insufficient documentation

## 2018-04-07 DIAGNOSIS — F10920 Alcohol use, unspecified with intoxication, uncomplicated: Secondary | ICD-10-CM

## 2018-04-07 DIAGNOSIS — F17210 Nicotine dependence, cigarettes, uncomplicated: Secondary | ICD-10-CM | POA: Diagnosis not present

## 2018-04-07 DIAGNOSIS — F10220 Alcohol dependence with intoxication, uncomplicated: Secondary | ICD-10-CM | POA: Diagnosis not present

## 2018-04-07 DIAGNOSIS — F10120 Alcohol abuse with intoxication, uncomplicated: Secondary | ICD-10-CM | POA: Diagnosis not present

## 2018-04-07 LAB — CBC
HCT: 52.8 % — ABNORMAL HIGH (ref 39.0–52.0)
Hemoglobin: 18.7 g/dL — ABNORMAL HIGH (ref 13.0–17.0)
MCH: 32.6 pg (ref 26.0–34.0)
MCHC: 35.4 g/dL (ref 30.0–36.0)
MCV: 92.1 fL (ref 78.0–100.0)
Platelets: 195 10*3/uL (ref 150–400)
RBC: 5.73 MIL/uL (ref 4.22–5.81)
RDW: 12.6 % (ref 11.5–15.5)
WBC: 8 10*3/uL (ref 4.0–10.5)

## 2018-04-07 LAB — COMPREHENSIVE METABOLIC PANEL
ALT: 26 U/L (ref 0–44)
AST: 26 U/L (ref 15–41)
Albumin: 4.8 g/dL (ref 3.5–5.0)
Alkaline Phosphatase: 66 U/L (ref 38–126)
Anion gap: 11 (ref 5–15)
BUN: 6 mg/dL (ref 6–20)
CO2: 26 mmol/L (ref 22–32)
Calcium: 8.8 mg/dL — ABNORMAL LOW (ref 8.9–10.3)
Chloride: 107 mmol/L (ref 98–111)
Creatinine, Ser: 0.91 mg/dL (ref 0.61–1.24)
GFR calc Af Amer: >60 mL/min (ref >60)
GFR calc non Af Amer: >60 mL/min (ref >60)
Glucose, Bld: 97 mg/dL (ref 70–99)
Potassium: 3.8 mmol/L (ref 3.5–5.1)
Sodium: 144 mmol/L (ref 135–145)
Total Bilirubin: 0.7 mg/dL (ref 0.3–1.2)
Total Protein: 8 g/dL (ref 6.5–8.1)

## 2018-04-07 LAB — RAPID URINE DRUG SCREEN, HOSP PERFORMED
Amphetamines: NOT DETECTED
Barbiturates: NOT DETECTED
Benzodiazepines: NOT DETECTED
Cocaine: NOT DETECTED
Opiates: NOT DETECTED
Tetrahydrocannabinol: NOT DETECTED

## 2018-04-07 LAB — SALICYLATE LEVEL: Salicylate Lvl: <7 mg/dL (ref 2.8–30.0)

## 2018-04-07 LAB — ACETAMINOPHEN LEVEL: Acetaminophen (Tylenol), Serum: <10 ug/mL — ABNORMAL LOW (ref 10–30)

## 2018-04-07 LAB — ETHANOL: Alcohol, Ethyl (B): 258 mg/dL — ABNORMAL HIGH (ref <10)

## 2018-04-07 MED ORDER — LORAZEPAM 1 MG PO TABS: 1.0000 mg | ORAL_TABLET | Freq: Four times a day (QID) | ORAL | Status: DC | PRN

## 2018-04-07 MED ORDER — THIAMINE HCL 100 MG/ML IJ SOLN: 100.0000 mg | Freq: Every day | INTRAMUSCULAR | Status: DC

## 2018-04-07 MED ORDER — FOLIC ACID 1 MG PO TABS: 1.0000 mg | ORAL_TABLET | Freq: Every day | ORAL | Status: DC

## 2018-04-07 MED ORDER — ADULT MULTIVITAMIN W/MINERALS CH: 1.0000 | ORAL_TABLET | Freq: Every day | ORAL | Status: DC

## 2018-04-07 MED ORDER — LORAZEPAM 1 MG PO TABS: 0.0000 mg | ORAL_TABLET | Freq: Four times a day (QID) | ORAL | Status: DC

## 2018-04-07 MED ORDER — VITAMIN B-1 100 MG PO TABS: 100.0000 mg | ORAL_TABLET | Freq: Every day | ORAL | Status: DC

## 2018-04-07 MED ORDER — LORAZEPAM 2 MG/ML IJ SOLN: 1.0000 mg | Freq: Four times a day (QID) | INTRAMUSCULAR | Status: DC | PRN

## 2018-04-07 MED ORDER — LORAZEPAM 1 MG PO TABS: 0.0000 mg | ORAL_TABLET | Freq: Two times a day (BID) | ORAL | Status: DC

## 2018-04-07 NOTE — Discharge Instructions (Signed)
Substance Abuse Treatment Programs ° °Intensive Outpatient Programs °[facility]     °[address]      °[place], [place]                   °[phone]      ° °[facility] °[address] °[place], [place] °[phone] ° °[facility]     °(Inpatient and outpatient)     °[address]           °[phone]   ° °[facility] °[phone] (Suboxone and Methadone) ° °[address]      °[place], [place] [address]      °[phone]      ° °[address] °[place], [place] °852-3033 ° °[facility] (Outpatient/Inpatient, Chemical)    °(insurance only) [phone]      °       °[facility] (Groups & Residential) °[place], [place] °[phone] ° °   °[facility]     °[address]     °[place], [place]      °[phone]      ° °[facility] (for caregivers and family) °[address] °[place], [place] °336-299-4655 ° ° ° ° ° °Residential Treatment Programs °[facility]      °[address], [place], [place] [address]  °[phone]      ° °[facility] °[address] [place], [place] [address] °[phone] ° °[facility]        °[phone]      ° °[facility] °[phone] ° °[facility]             °[address]                                         °[place], [place]                                                °[phone] or [phone]                              ° °[facility] [place] °[address] °[place], [address] °[phone] ° °[facility]    °[address]      °, [place]     °[phone]      ° °[facility] °[address] °, [place] °[phone] ° °[facility]   °[address]     °[place], [place] [address]     °[phone]      °Admissions: 8am-3pm M-F ° °[facility] °[address] °[place],  [place] °[phone] ° °[facility]: Residential Program (90 Days)   °[place], [place]      °[phone] or [phone]    ° °[facility] °[place], [place] °(Walk in Hours over the weekend or by referral) ° °[facility] °[address], [place], [place] [address] °([phone]) [phone] ° °Crisis Mobile: [facility]:  1-877-626-1772 (for crisis response 24 hours a day) °[facility] Hotline:      1-800-256-2452 °Outpatient Psychiatry and Counseling ° °[facility]: Mobile Crisis   Management 24 hours:  1-579-867-5796  [facility] of the [employer] sliding scale fee and walk in schedule: M-F 8am-12pm/1pm-3pm [address]  [place], [place] [address] [place], [place] [address] [phone]  [facility] ([facility])- new patient walk-in appointments available Monday - Friday 8am -3pm.          [address] [place], [place] [address] [phone] or crisis line- [facility]/ Intensive Outpatient Therapy Program [place], [place] [address] [place]      [phone] [place], [place] [address]                 [place]   [facility] [phone] [place], [place] [address]   [employer]          [address] Johnette Abraham  [place], [place] [address]       [phone]  [facility] [address] [place], [place] [address] [phone]  [facility]    [address] [place], [place] [address]     [place], [place] Joycelyn Man     [place] [address]     [phone]       [facility] [place] [address]  [facility]     [address], [place], [place], [place] [address] [place]     [address]     [place], [place] [address]     [phone]       [facility] [address] [place], [place] [address] [phone] Resources for Temporary Residential Assistance/Crisis [facility]) M-F 8am-3pm   [address] [place], [place] [address]   [phone] Services include: laundry, barbering, support groups, case management, phone  & computer access, showers, AA/NA mtgs, mental health/substance abuse nurse, job skills class, disability information, VA assistance, spiritual classes, etc.   [facility]   [address], [place]     [place]              [employer] (women and children)       [address]. [place], [place] [address] [phone] [email or web address] for application and process Application Required  [facility]   [address]    [place] [address]     [phone]                    [place],  [address] [phone](OILNZVJK application appt.) Application Required  [facility] (women only)    [address]     [place],  [address]     [phone]  Intake starts 6pm daily Need valid ID, SSC, & Police report [facility] [place] [address] [place], [place] [phone] Application Required  [facility] (men only)     [address]      [place], [place]     [phone]       [facility] [place] (Pregnant women only) [address] [place], [place] [phone]  [facility]      [address]      [place] [address]     [phone]             [facility] [address] [place], [place] [phone] 90 day commitment/SA/Application process  [facility](men only)     [address]     [place], [place]     [phone]       Check-in at [facility] [place]            [facility] [address] [place], [place] [address] [phone] Men/Women/Women and Children must be there by 7 pm  [facility] [place], [place] [phone]                  Take your usual prescriptions as previously directed.  Call the outpatient resources given to you today if you are interested in detox programs. Call your regular medical doctor on Monday to schedule a follow up appointment within the next week.  Return to the Emergency Department immediately sooner if worsening.

## 2018-04-07 NOTE — BH Assessment (Signed)
[facility]   Clinician talked with Dr. Lajean Saverancor.  [facility].  Psychiatry to reassess and complete 1st opinion.
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
## 2018-04-07 NOTE — ED Notes (Signed)
Pt now awake, given breakfast tray.

## 2018-04-07 NOTE — ED Provider Notes (Signed)
Psych has evaluated pt: states pt is not SI/HI/AVH, does not meet inpt criteria at this time, IVC can be rescinded and pt can be d/c with outpt resources. Will d/c stable.    [name], Brian Hughett, DO 04/07/18 1024
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
## 2018-04-07 NOTE — ED Notes (Signed)
tts in progress 

## 2018-04-07 NOTE — ED Notes (Signed)
IVC rescind paperwork being filled out by Diplomatic Services operational officersecretary and EDP.

## 2018-04-07 NOTE — ED Notes (Signed)
Pt given belongings.

## 2018-05-10 ENCOUNTER — Ambulatory Visit: Payer: 59 | Admitting: Family Medicine

## 2018-05-10 ENCOUNTER — Encounter: Payer: Self-pay | Admitting: Family Medicine

## 2018-05-10 VITALS — BP 130/72 | Temp 98.3°F | Ht 72.0 in | Wt 199.2 lb

## 2018-05-10 DIAGNOSIS — J019 Acute sinusitis, unspecified: Secondary | ICD-10-CM

## 2018-05-10 MED ORDER — AMOXICILLIN-POT CLAVULANATE 875-125 MG PO TABS: ORAL_TABLET | ORAL | 0 refills | Status: DC

## 2018-05-10 NOTE — Progress Notes (Signed)
   Subjective:    Patient ID: [name], male    DOB: 06/08/1991, 27 y.o.   MRN: [id number]  Cough  This is a new problem. The current episode started in the past 7 days. Associated symptoms include ear pain, headaches, nasal congestion, a sore throat and wheezing. He has tried nothing for the symptoms.    Headache, stopped up. Frontal pain. Worse with cough and change of position  Smokes  Coughing up stuff in the chest     Review of Systems  HENT: Positive for ear pain and sore throat.   Respiratory: Positive for cough and wheezing.   Neurological: Positive for headaches.       Objective:   Physical Exam  Alert, mild malaise. Hydration good Vitals stable. frontal/ maxillary tenderness evident positive nasal congestion. pharynx normal neck supple  lungs clear/no crackles or wheezes. heart regular in rhythm       Assessment & Plan:  Impression rhinosinusitis likely post viral, discussed with patient. plan antibiotics prescribed. Questions answered. Symptomatic care discussed. warning signs discussed. WSL

## 2018-06-10 ENCOUNTER — Ambulatory Visit: Payer: 59 | Admitting: Family Medicine

## 2018-06-10 ENCOUNTER — Encounter: Payer: Self-pay | Admitting: Family Medicine

## 2018-06-10 VITALS — BP 134/90 | Ht 73.0 in | Wt 191.8 lb

## 2018-06-10 DIAGNOSIS — F410 Panic disorder [episodic paroxysmal anxiety] without agoraphobia: Secondary | ICD-10-CM

## 2018-06-10 DIAGNOSIS — F439 Reaction to severe stress, unspecified: Secondary | ICD-10-CM | POA: Diagnosis not present

## 2018-06-10 DIAGNOSIS — R030 Elevated blood-pressure reading, without diagnosis of hypertension: Secondary | ICD-10-CM

## 2018-06-10 NOTE — Progress Notes (Signed)
   Subjective:    Patient ID: [name], male    DOB: [birth year], 27 y.o.   MRN: [id number]  HPI  Patient arrives he feels like his blood pressure has been elevated for a few days. Patient states he has also had a headache and some congestion.  She relates head congestion drainage coughing denies high fever chills sweats  Patient with significant elevated blood pressure on multiple different occasions where he feels like he is having a headache along with elevated blood pressure makes him feel bad he denies excessive salt use but he does admit to drinking a fair amount of alcohol  I talked with him at length about the importance of cutting back on alcohol and avoiding it altogether if possible-the patient does have excessive amount on the weekends when he is not working He denies taking drugs denies being impaired He denies being alcoholic.  Patient is having significant amount of stress.  He finds himself getting anxious and nervous.  He denies being depressed.  He states whenever he gets stressed out especially with his girlfriend or other situations he finds himself feeling sick dizzy headache and not feeling good and will have panic attacks as well.  Patient does not want to be on any type of antidepressant but he is willing to go for counseling.  Because of his situation he has missed multiple days of work.  Review of Systems    Relates intermittent headaches denies chest tightness pressure pain dizziness denies wheezing difficulty breathing. Objective:   Physical Exam Eardrums are normal throat is normal neck no masses lungs are clear no crackles heart is regular no murmurs pulses normal blood pressure borderline but acceptable extremities no edema neurologic grossly normal      it is certainly concerning what is going on with the patient this gentleman will need some counseling to help him I did talk with him about the antidepressant but he does not want to be on an  antidepressant        Assessment & Plan:  Patient needs to cut back on alcohol We talked at length about the importance of potentially quitting alcohol  Blood pressure borderline very important for the patient to cut back on alcohol I encouraged him to quit alcohol  I do feel that the patient would benefit from counseling on a regular basis to help him with those stress anxiety and panic attacks.  I also talk with the patient at length about the possibility of a low-dose antidepressant that might help prevent some of these issues but he does not want to try this currently  I highly recommend for this patient to be on FMLA he will need intermittent time to be able to go to counseling appointments he will also run the risk of missing work whenever he has a significant flareup of anxiety stress or has panic attacks.  These are certainly outside of his control currently but hopefully counseling would help him.  He seems to be open to the idea.  His mother was present for part of the visit.  The patient was to check with his insurance regarding referrals for counseling or possibly telemetry counseling through his insurance but we will go ahead with a referral

## 2018-06-11 ENCOUNTER — Telehealth: Payer: Self-pay | Admitting: Family Medicine

## 2018-06-11 NOTE — Telephone Encounter (Signed)
Patient had FMLA faxed to our office in your box as you requested.

## 2018-06-14 NOTE — Progress Notes (Signed)
Pt states that his dad saw a psychiatrist across the street from the hospital and he would like to get in with in him. Pt said the name of the psychiartrist was Dr.Roberg?(not sure if I spelled that correct)

## 2018-06-15 NOTE — Telephone Encounter (Signed)
FMLA was filled out to the best my ability please review if there is additional areas to fill and please do so then forward onward

## 2018-06-17 DIAGNOSIS — Z029 Encounter for administrative examinations, unspecified: Secondary | ICD-10-CM

## 2018-06-23 NOTE — Progress Notes (Signed)
Please do referral try to set up with Dr.[place] Anxiety stress alcohol misuse

## 2018-06-24 NOTE — Addendum Note (Signed)
Addended by: Meredith Leeds on: 06/24/2018 09:22 AM   Modules accepted: Orders

## 2018-06-24 NOTE — Progress Notes (Signed)
Patient is aware of all. 

## 2018-06-27 ENCOUNTER — Encounter: Payer: Self-pay | Admitting: Family Medicine

## 2018-07-03 DIAGNOSIS — S0501XA Injury of conjunctiva and corneal abrasion without foreign body, right eye, initial encounter: Secondary | ICD-10-CM | POA: Diagnosis not present

## 2018-07-04 DIAGNOSIS — S0501XD Injury of conjunctiva and corneal abrasion without foreign body, right eye, subsequent encounter: Secondary | ICD-10-CM | POA: Diagnosis not present

## 2018-07-05 ENCOUNTER — Telehealth: Payer: Self-pay | Admitting: Psychology

## 2018-07-15 ENCOUNTER — Ambulatory Visit: Payer: 59 | Admitting: Family Medicine

## 2018-11-13 ENCOUNTER — Ambulatory Visit (INDEPENDENT_AMBULATORY_CARE_PROVIDER_SITE_OTHER): Payer: 59 | Admitting: Family Medicine

## 2018-11-13 ENCOUNTER — Encounter: Payer: Self-pay | Admitting: Family Medicine

## 2018-11-13 ENCOUNTER — Other Ambulatory Visit: Payer: Self-pay

## 2018-11-13 DIAGNOSIS — S20311A Abrasion of right front wall of thorax, initial encounter: Secondary | ICD-10-CM | POA: Diagnosis not present

## 2018-11-13 DIAGNOSIS — S20211A Contusion of right front wall of thorax, initial encounter: Secondary | ICD-10-CM

## 2018-11-13 DIAGNOSIS — S298XXA Other specified injuries of thorax, initial encounter: Secondary | ICD-10-CM

## 2018-11-13 MED ORDER — NAPROXEN 500 MG PO TABS: 500.0000 mg | ORAL_TABLET | Freq: Two times a day (BID) | ORAL | 0 refills | Status: DC
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
## 2018-11-15 ENCOUNTER — Telehealth: Payer: Self-pay
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
## 2018-11-15 NOTE — Telephone Encounter (Signed)
I agree with that assessment that you were given Testing is not recommended All above should practice social distancing

## 2018-11-15 NOTE — Telephone Encounter (Signed)
Patient is aware 

## 2018-11-27 ENCOUNTER — Telehealth: Payer: Self-pay | Admitting: Family Medicine

## 2018-11-27 DIAGNOSIS — F439 Reaction to severe stress, unspecified: Secondary | ICD-10-CM

## 2018-11-27 NOTE — Telephone Encounter (Signed)
Please go ahead with referral- stress

## 2018-11-27 NOTE — Telephone Encounter (Signed)
Pt states he needs a referral to Dr. Kieth Brightly   Please advise & initiate referral in system so it can be processed

## 2018-11-27 NOTE — Telephone Encounter (Signed)
Please advise. Thank you

## 2018-11-27 NOTE — Telephone Encounter (Signed)
Referral placed. Left message to return call 

## 2018-12-02 NOTE — Telephone Encounter (Signed)
Pt notified referral was put in.

## 2018-12-12 ENCOUNTER — Other Ambulatory Visit: Payer: Self-pay | Admitting: *Deleted

## 2018-12-12 ENCOUNTER — Telehealth: Payer: Self-pay | Admitting: Family Medicine

## 2018-12-12 MED ORDER — HYDROCORTISONE (PERIANAL) 2.5 % EX CREA: 1.0000 "application " | TOPICAL_CREAM | Freq: Three times a day (TID) | CUTANEOUS | 0 refills | Status: DC

## 2018-12-12 NOTE — Telephone Encounter (Signed)
Discussed with pt. Pt verbalized understanding and med sent to pharm.  

## 2018-12-12 NOTE — Telephone Encounter (Signed)
anusol hc apply tid to affected area  Also use stool softeners to keep stool soft miralaxz or colace

## 2018-12-12 NOTE — Telephone Encounter (Signed)
Pt would like something called in for hemorrhoids.   Please send to [id number] - [place], [place] - [address] AT [facility]

## 2019-02-03 ENCOUNTER — Other Ambulatory Visit: Payer: Self-pay

## 2019-02-03 ENCOUNTER — Encounter (HOSPITAL_COMMUNITY): Payer: Self-pay | Admitting: Psychology

## 2019-02-03 ENCOUNTER — Ambulatory Visit (INDEPENDENT_AMBULATORY_CARE_PROVIDER_SITE_OTHER): Payer: 59 | Admitting: Psychology

## 2019-02-03 DIAGNOSIS — F1020 Alcohol dependence, uncomplicated: Secondary | ICD-10-CM | POA: Diagnosis not present

## 2019-02-03 NOTE — Addendum Note (Signed)
Addended byAmalia Hailey, Orvis Stann on: 02/03/2019 03:00 PM   Modules accepted: Level of Service
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
## 2019-02-05 ENCOUNTER — Encounter (HOSPITAL_COMMUNITY): Payer: 59

## 2019-02-05 ENCOUNTER — Telehealth (HOSPITAL_COMMUNITY): Payer: Self-pay | Admitting: Psychology

## 2019-02-06 ENCOUNTER — Encounter (HOSPITAL_COMMUNITY): Payer: 59

## 2019-02-10 ENCOUNTER — Telehealth (HOSPITAL_COMMUNITY): Payer: Self-pay | Admitting: Psychology

## 2019-02-10 ENCOUNTER — Encounter (HOSPITAL_COMMUNITY): Payer: 59

## 2019-02-12 ENCOUNTER — Encounter: Payer: Self-pay | Admitting: Licensed Clinical Social Worker

## 2019-02-12 ENCOUNTER — Encounter (HOSPITAL_COMMUNITY): Payer: Self-pay | Admitting: Psychology

## 2019-02-12 ENCOUNTER — Other Ambulatory Visit (HOSPITAL_COMMUNITY): Payer: 59 | Attending: Medical | Admitting: Licensed Clinical Social Worker

## 2019-02-12 ENCOUNTER — Telehealth (HOSPITAL_COMMUNITY): Payer: Self-pay | Admitting: Psychology

## 2019-02-12 ENCOUNTER — Encounter (HOSPITAL_COMMUNITY): Payer: Self-pay | Admitting: Medical

## 2019-02-12 ENCOUNTER — Other Ambulatory Visit: Payer: Self-pay

## 2019-02-12 VITALS — BP 130/88 | HR 80 | Temp 98.6°F | Ht 72.0 in | Wt 187.0 lb

## 2019-02-12 DIAGNOSIS — F1221 Cannabis dependence, in remission: Secondary | ICD-10-CM

## 2019-02-12 DIAGNOSIS — F17200 Nicotine dependence, unspecified, uncomplicated: Secondary | ICD-10-CM | POA: Diagnosis not present

## 2019-02-12 DIAGNOSIS — Z811 Family history of alcohol abuse and dependence: Secondary | ICD-10-CM | POA: Insufficient documentation

## 2019-02-12 DIAGNOSIS — F17210 Nicotine dependence, cigarettes, uncomplicated: Secondary | ICD-10-CM | POA: Diagnosis not present

## 2019-02-12 DIAGNOSIS — Z62812 Personal history of neglect in childhood: Secondary | ICD-10-CM | POA: Diagnosis not present

## 2019-02-12 DIAGNOSIS — Z791 Long term (current) use of non-steroidal anti-inflammatories (NSAID): Secondary | ICD-10-CM | POA: Insufficient documentation

## 2019-02-12 DIAGNOSIS — F1020 Alcohol dependence, uncomplicated: Secondary | ICD-10-CM

## 2019-02-12 DIAGNOSIS — Z639 Problem related to primary support group, unspecified: Secondary | ICD-10-CM | POA: Diagnosis not present

## 2019-02-12 DIAGNOSIS — R454 Irritability and anger: Secondary | ICD-10-CM

## 2019-02-12 DIAGNOSIS — Z6372 Alcoholism and drug addiction in family: Secondary | ICD-10-CM

## 2019-02-12 DIAGNOSIS — F1920 Other psychoactive substance dependence, uncomplicated: Secondary | ICD-10-CM | POA: Diagnosis not present

## 2019-02-12 NOTE — Progress Notes (Signed)
Patient ID: [name], male   DOB: [birth year], 28 y.o.   MRN: [id number] Orientation to CD-IOP: Counselor met with the patient as scheduled to complete the orientation to the CD-IOP. The documentation was reviewed, signed and completed accordingly. The patient reported he had drunk last night. His sobriety date is today - June 24. The patient will begin the CD-IOP this afternoon.
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
## 2019-02-13 ENCOUNTER — Other Ambulatory Visit: Payer: Self-pay

## 2019-02-13 ENCOUNTER — Other Ambulatory Visit (HOSPITAL_COMMUNITY): Payer: 59 | Admitting: Licensed Clinical Social Worker

## 2019-02-13 DIAGNOSIS — F17210 Nicotine dependence, cigarettes, uncomplicated: Secondary | ICD-10-CM | POA: Diagnosis not present

## 2019-02-13 DIAGNOSIS — F1020 Alcohol dependence, uncomplicated: Secondary | ICD-10-CM | POA: Diagnosis not present

## 2019-02-13 DIAGNOSIS — F1221 Cannabis dependence, in remission: Secondary | ICD-10-CM | POA: Diagnosis not present

## 2019-02-13 DIAGNOSIS — Z639 Problem related to primary support group, unspecified: Secondary | ICD-10-CM | POA: Diagnosis not present

## 2019-02-13 DIAGNOSIS — Z791 Long term (current) use of non-steroidal anti-inflammatories (NSAID): Secondary | ICD-10-CM | POA: Diagnosis not present

## 2019-02-13 DIAGNOSIS — R454 Irritability and anger: Secondary | ICD-10-CM | POA: Diagnosis not present

## 2019-02-13 DIAGNOSIS — Z6372 Alcoholism and drug addiction in family: Secondary | ICD-10-CM | POA: Diagnosis not present

## 2019-02-13 DIAGNOSIS — Z62812 Personal history of neglect in childhood: Secondary | ICD-10-CM | POA: Diagnosis not present

## 2019-02-17 ENCOUNTER — Other Ambulatory Visit: Payer: Self-pay

## 2019-02-17 ENCOUNTER — Encounter: Payer: Self-pay | Admitting: Licensed Clinical Social Worker

## 2019-02-17 ENCOUNTER — Other Ambulatory Visit (HOSPITAL_COMMUNITY): Payer: 59 | Admitting: Licensed Clinical Social Worker

## 2019-02-17 DIAGNOSIS — F1020 Alcohol dependence, uncomplicated: Secondary | ICD-10-CM | POA: Diagnosis not present

## 2019-02-17 DIAGNOSIS — Z791 Long term (current) use of non-steroidal anti-inflammatories (NSAID): Secondary | ICD-10-CM | POA: Diagnosis not present

## 2019-02-17 DIAGNOSIS — F1221 Cannabis dependence, in remission: Secondary | ICD-10-CM

## 2019-02-17 DIAGNOSIS — Z639 Problem related to primary support group, unspecified: Secondary | ICD-10-CM | POA: Diagnosis not present

## 2019-02-17 DIAGNOSIS — Z62812 Personal history of neglect in childhood: Secondary | ICD-10-CM | POA: Diagnosis not present

## 2019-02-17 DIAGNOSIS — R454 Irritability and anger: Secondary | ICD-10-CM | POA: Diagnosis not present

## 2019-02-17 DIAGNOSIS — Z6372 Alcoholism and drug addiction in family: Secondary | ICD-10-CM | POA: Diagnosis not present

## 2019-02-17 DIAGNOSIS — F17210 Nicotine dependence, cigarettes, uncomplicated: Secondary | ICD-10-CM | POA: Diagnosis not present

## 2019-02-17 NOTE — Progress Notes (Signed)
    Daily Group Progress Note  Program: CD-IOP   Group Time: 1pm-2:30pm  Participation Level: Active  Behavioral Response: Appropriate  Type of Therapy: Process Group  Topic: Clinician checked in with clients, assessing for SI/HI/psychosis and substance use. Clinician allowed time for group members to process recent stressors and effect on recovery. Clinician presented the topic of Guilt vs Shame. Clinician and group members processed the differences and the use of self-compassion as a skill to address shame. Clinician and group members processed underlying feelings, including guilt and shame, which can present as anger. Group members processed how expressing anger was learned, and identified common triggers. Clinician and group members processed how active substance use effected their anger response.   Group Time: 2:30pm-4pm  Participation Level: Active  Behavioral Response: Appropriate  Type of Therapy: Psycho-education Group  Topic: Clinician facilitated mindfulness meditation. Clinician presented the psycho-educational topic of Cognitive Distortions. Clinician reviewed cognitive triangle and connection of thoughts on feelings and behaviors. Clinician and group members reviewed ways to challenge cognitive distortions.    Summary: Client presents for first group session, see PA note for additional details. Client shares reason for engaging in group services however minimizes problems related to substance use. Client does identify that some problem behaviors have occurred when he was intoxicated. Client is unsure at this time if he wants to achieve/maintain sobriety and was encouraged by his family to engage in services as well as maintaining employment and addressing legal consequences. Client is able to identify some cognitive distortions he uses to make excuses for his behaviors and 'get what he wants.' Client reports drinking the previous night when friends came over. Client was  encouraged to find local [place] meetings, online or face to face.  Client sobriety date: 02/12/19   Family Program: Family present? No   Name of family member(s): NA  UDS collected: Yes Results: [facility]  AA/NA attended?: No  Sponsor?: No   Sueellen Kayes A Maryssa Giampietro, LCSW, LCAS

## 2019-02-17 NOTE — Progress Notes (Signed)
    Daily Group Progress Note  Program: CD-IOP   Group Time: 1pm-2pm  Participation Level: Active  Behavioral Response: Appropriate and Sharing  Type of Therapy: Psycho-education Group  Topic: Clinician checked in with clinician, assessing for SI/HI/psychosis, overall level of functioning and substance use. Clinician facilitated processing related to weekend stressors and their effect on recovery. Clinician inquired about skills attempted over the weekend. Clinician reviewed skills from previous week.    Group Time: 2pm-4pm  Participation Level: Active  Behavioral Response: Appropriate and Sharing  Type of Therapy: Process Group  Topic: Clinician presented the topic of resentment and forgiveness. Clinician and group members discussed situations leading to resentment, additional feelings, thoughts, and behaviors related to resentment. Clinician and group members identified core believes and processed how a violation of these by self or others can lead to guilt or resentment. Clinician and group members processed violating boundaries is more likely when actively using substances.     Summary: Sobriety date 02/17/2019, 0 meetings since last group. Client shared use over the weekend and rationalized continued use by reporting he is drinking less and no longer drinking beer, reporting he usually only gets in trouble when he is 'black out drunk.' Client identifies father's aggressive behavior, anger, substance use, and being a poor role model is a resentment. Client identifies additional emotions as disappointment however identifies that only client is responsible for client actions. Client identifies he adopted behaviors related to anger which he saw growing up. Client minimizes legal involevement at this time. Individual therapy scheduled for 02/18/2019.   Family Program: Family present? No   Name of family member(s): N/A  UDS collected: Yes Results: Positive  AA/NA attended?: No.  Reports getting contact information from his mother about a potential person he can attend a meeting with.  Sponsor?: No   Brian Baton Mikle Sternberg, LCSW, LCAS

## 2019-02-17 NOTE — Progress Notes (Signed)
    Daily Group Progress Note  Program: CD-IOP   Group Time: 1pm-2:30pm  Participation Level: Active  Behavioral Response: Appropriate  Type of Therapy: Process Group  Topic: Clinician met with clients, assessing for SI/HI/psychosis and overall level of functioning. Clinician and group members processed reasons for engaging in services and goals for treatment. Group members processed motivation for treatment. Clinician and group members challenged levels of motivation in group members. Clinician utilized motivational interviewing to develop discrepancies in client verbal responses and recent actions. Clinician challenged group members to be mindful of cognitive distortions recently reviewed and accepting responsibility for own behaviors and recovery. Clinician and group members completed cognitive distortion list and how to identify these to address ruminating thoughts. Clinician provided clients with handout of STOPP skill to assist in identifying distorted thinking and allow opportunity to create alternative, realistic thought.   Group Time: 2:30pm-4pm  Participation Level: Active  Behavioral Response: Appropriate  Type of Therapy: Psycho-education Group  Topic: Group members discussed reasons to utilize [employer] and options for online or face to face meetings. Clinician presented the Distress Tolerance Skill of 'Letting Go of Emotional Suffering: Mindfulness of Your Current Emotion." Clinician and group members reviewed identifying and observing emotions non judgmentally, and accepting that emotions are not permanent. Clinician presented sensory options for physical response to stress or cravings as well as additional options to add into daily life for long term changes. Clinician encouraged clients to create a self-affirmation. Clinician inquired about plans to self-care over the weekend and activities planned to support recovery.   Summary: Client sobriety date 02/13/2019, noting  he stopped at the store to pick up beer 'without thinking.' After challenges from the group client was able to identify several points where he could have made a different decisions, but instead rationalized his continued drinking. Client was able to identify additional cognitive distortions used recently and reasons for excuses. Client was receptive to challenges from group members, identifying he did not have a clear reason behind engaging in Treatment as he was unsure he was interesting in stopping substance use permanently. Client is open to consider stopping drinking while in treatment, but unsure if he will. Client is able to identify a sensory skill he is willing to attempt over the weekend, and listed activities planned after challenged by group members the dangers of 'the plan to have no plans.'   Family Program: Family present? No   Name of family member(s): NA  UDS collected: Yes Results: [facility]. Reports ongoing alcohol use  AA/NA attended?: No  Sponsor?: No   Rynell Ciotti A Oluwatamilore Starnes, LCSW, LCAS

## 2019-02-18 ENCOUNTER — Ambulatory Visit (INDEPENDENT_AMBULATORY_CARE_PROVIDER_SITE_OTHER): Payer: 59 | Admitting: Licensed Clinical Social Worker

## 2019-02-18 ENCOUNTER — Other Ambulatory Visit: Payer: Self-pay

## 2019-02-18 DIAGNOSIS — F1020 Alcohol dependence, uncomplicated: Secondary | ICD-10-CM

## 2019-02-19 ENCOUNTER — Other Ambulatory Visit: Payer: Self-pay

## 2019-02-19 ENCOUNTER — Encounter (HOSPITAL_COMMUNITY): Payer: Self-pay

## 2019-02-19 ENCOUNTER — Other Ambulatory Visit (HOSPITAL_COMMUNITY): Payer: 59 | Attending: Medical | Admitting: Licensed Clinical Social Worker

## 2019-02-19 DIAGNOSIS — F1221 Cannabis dependence, in remission: Secondary | ICD-10-CM | POA: Diagnosis not present

## 2019-02-19 DIAGNOSIS — F1020 Alcohol dependence, uncomplicated: Secondary | ICD-10-CM

## 2019-02-19 DIAGNOSIS — R454 Irritability and anger: Secondary | ICD-10-CM | POA: Diagnosis not present

## 2019-02-19 DIAGNOSIS — F411 Generalized anxiety disorder: Secondary | ICD-10-CM

## 2019-02-19 DIAGNOSIS — G250 Essential tremor: Secondary | ICD-10-CM

## 2019-02-19 DIAGNOSIS — Z811 Family history of alcohol abuse and dependence: Secondary | ICD-10-CM | POA: Diagnosis not present

## 2019-02-19 DIAGNOSIS — Z6372 Alcoholism and drug addiction in family: Secondary | ICD-10-CM

## 2019-02-19 DIAGNOSIS — Z639 Problem related to primary support group, unspecified: Secondary | ICD-10-CM

## 2019-02-19 DIAGNOSIS — F419 Anxiety disorder, unspecified: Secondary | ICD-10-CM | POA: Diagnosis not present

## 2019-02-19 DIAGNOSIS — F17200 Nicotine dependence, unspecified, uncomplicated: Secondary | ICD-10-CM

## 2019-02-19 NOTE — Progress Notes (Signed)
Patient ID: [name], male   DOB: Apr 01, 1991, 28 y.o.   MRN: [id number]                                               [place]                                           Follow-up Outpatient CDIOP Date: 02/19/19  Admission Date:02/12/19  Sobriety Date: Drank yesterday ` HPI: Pt is seen for initial CD IOP provider FU. He admits to cutting back on his drinking as noted. Has been working a lot-pattern is to work hard and come home to "relax"ie drinks after work. Hasnt watched video [place] due to work. Says he doesnt need Detox/Meds to stop.  Review of Systems: Psychiatric: Agitation: admits to "nerves" which alcohol helps calm. Hallucination: No Depressed Mood: PHQ 2 score 0 Insomnia: No Hypersomnia: No Altered Concentration: No Feels Worthless: Several days in a 2 week period Grandiose Ideas: No Belief In Special Powers: No New/Increased Substance Abuse: No Compulsions: No  Neurologic: Headache: No Seizure: No Paresthesias: No Tremor +  Current Medications: None doesnt want to take anything "I'm funny about meds" hydrocortisone 2.5 % rectal cream Commonly known as: Anusol-HC Place 1 application rectally 3 (three) times daily.  naproxen 500 MG tablet Commonly known as: Naprosyn      Mental Status Examination  Appearance:CASUAL;NEAT Alert: Yes Attention: good  Cooperative: Yes Eye Contact: Good Speech: Clear and coherent Psychomotor Activity: Normal Memory/Concentration: Normal/intact Oriented: person, place, time/date and situation Mood: ANXIOUS Affect: Appropriate and Congruent Thought Processes and Associations: Coherent and Intact Fund of Knowledge: WDL-lacking in disease concept odf alcholism Thought Content: WDL Insight: Lacking Judgement: Impaired  VCB:[id number] + for alcohol PDMP-clear  Diagnosis:  0 Alcohol use disorder, severe, dependence (HCC) 0 Cannabis dependence in remission ([place]) 0 Biological father  as perpetrator of maltreatment and neglect 0 Family history of alcoholism in father 0 Dysfunctional family processes 0 Excessive anger 0 Nicotine use disorder 0 Anxiety state     0   Familial tremor  Assessment: Seeking abstinence-reinforced with Blood Alcohol of 258 in 2028 diagnostic of tolerance and Disease of Alcoholism  Familial tremor   Treatment Plan:Per Admission Plan  Discussed need for him to abstain and potential danger if alcohol use stopped too quickly.He understands and will attempt to taper to zero without meds. Staff will monior and FU with Provider in 1 week Darlyne Russian, PA-C

## 2019-02-20 ENCOUNTER — Encounter (HOSPITAL_COMMUNITY): Payer: 59

## 2019-02-24 ENCOUNTER — Other Ambulatory Visit (HOSPITAL_COMMUNITY): Payer: 59 | Admitting: Licensed Clinical Social Worker

## 2019-02-24 ENCOUNTER — Other Ambulatory Visit: Payer: Self-pay

## 2019-02-24 DIAGNOSIS — F1920 Other psychoactive substance dependence, uncomplicated: Secondary | ICD-10-CM | POA: Diagnosis not present

## 2019-02-24 DIAGNOSIS — F1221 Cannabis dependence, in remission: Secondary | ICD-10-CM

## 2019-02-24 DIAGNOSIS — R454 Irritability and anger: Secondary | ICD-10-CM | POA: Diagnosis not present

## 2019-02-24 DIAGNOSIS — F419 Anxiety disorder, unspecified: Secondary | ICD-10-CM | POA: Diagnosis not present

## 2019-02-24 DIAGNOSIS — F1020 Alcohol dependence, uncomplicated: Secondary | ICD-10-CM

## 2019-02-24 DIAGNOSIS — Z811 Family history of alcohol abuse and dependence: Secondary | ICD-10-CM | POA: Diagnosis not present

## 2019-02-24 DIAGNOSIS — G250 Essential tremor: Secondary | ICD-10-CM | POA: Diagnosis not present

## 2019-02-24 NOTE — Progress Notes (Signed)
    Daily Group Progress Note  Program: CD-IOP   Group Time: 1pm-2:30pm  Participation Level: Active  Behavioral Response: Appropriate  Type of Therapy: Process Group  Topic:  Clinician checked in with clients, assessing for SI/HI/psychosis and overall level of functioning.including cravings and relapses. Cliniciain and group members processed forgiveness in relation to recovery. Clinician and group members dicussed fortiveness related to identified incidents of resentment. Clinician and group members processed differences between forgiveness and Radical Acceptance as ways to address difficult situations with may not have an acceptable resolution.   Group Time: 2:30pm-4pm  Participation Level: Active  Behavioral Response: Appropriate  Type of Therapy: Psycho-education Group  Topic: Clinician presented the topic of Self-Compassion. Clinician shared how components of self compassion can be utilized to support recovery. Clinician utilized Marilynn Latino video as supportive material   Summary: See PA-C note for additional details. Client reports the interaction with PA made him angry and he did not want a lecture as he does not plan on stopping drinking long term. Client was able to identify reasons to and not to forgive his father. Client identified that not forgiving he is able to keep a reminder of who he does not want to be. Clinician discussed with client harm reduction if declining total abstinence. Client reports he does still plan to drink over the holiday weekend but will attempt to drink less. Client reports he belives he shows himself compassion and can utilize positive self talk when needed.   Family Program: Family present? No   Name of family member(s): NA  UDS collected: No Results:  AA/NA attended?: No  Sponsor?: No   Lavonda Thal A Samya Siciliano, LCSW, LCAS

## 2019-02-25 ENCOUNTER — Other Ambulatory Visit: Payer: Self-pay

## 2019-02-25 ENCOUNTER — Ambulatory Visit (HOSPITAL_COMMUNITY): Payer: 59 | Admitting: Licensed Clinical Social Worker

## 2019-02-25 DIAGNOSIS — F1020 Alcohol dependence, uncomplicated: Secondary | ICD-10-CM

## 2019-02-25 NOTE — Progress Notes (Signed)
Virtual Visit via Telephone Note  I connected with [name] on 02/25/19 at  2:00 PM EDT by telephone and verified that I am speaking with the correct person using two identifiers.   I discussed the limitations, risks, security and privacy concerns of performing an evaluation and management service by telephone and the availability of in person appointments. I also discussed with the patient that there may be a patient responsible charge related to this service. The patient expressed understanding and agreed to proceed.  I discussed the assessment and treatment plan with the patient. The patient was provided an opportunity to ask questions and all were answered. The patient agreed with the plan and demonstrated an understanding of the instructions.   The patient was advised to call back or seek an in-person evaluation if the symptoms worsen or if the condition fails to improve as anticipated.  I provided 40 minutes of non-face-to-face time during this encounter.   Brian Messier, LCSW    THERAPIST PROGRESS NOTE  Session Time: 2pm-2:40pm  Participation Level: Active  Behavioral Response: NAAlertEuthymic  Type of Therapy: Individual Therapy  Treatment Goals addressed: Diagnosis: Alcohol Use Disorder  Interventions: Motivational Interviewing  Summary: [name] is a 28 y.o. male who presents with alcohol use disorder. Client identified how previous relationships and work stress likely added to his increased drinking and anger outbursts. Client reports he was able to not drink last night which was his goal despite strong cravings. Client plans to stay busy today and not drink tonight as well. Client states after group he thought about the discussion of drinking to deal with feelings and, though previously disagreed, he now sees how he did deal with some feelings by drinking. Client notes major upcoming stressors include his father being released from prison soon for whom he is  the payee, and court date on 03/18/19. Client states that he has been able to better manage his time with only one job which he believes has helped with his recent ability to decrease drinking and fights.   Suicidal/Homicidal: Nowithout intent/plan  Therapist Response: Clinician met with client via telehealth, assessing for SI/HI/psychosis and overall level of functioning. Clinician inquired about alcohol use the previous night and praised clients use of distraction skill to avoid drinking. Clinician inquired about upcoming stressors and plans to cope with additional stress. Clinician inquired about future plans of working 2 jobs which client identified as adding to his drinking. Clinician and client discussed pros and cons of addiction and sobriety and compared family members reactions to his drinking less during the week fors 'over-doing it" over the weekend.   Plan: Return again in 1 weeks.  Diagnosis: Axis I: Alcohol Abuse      Brian Messier, LCSW 02/25/2019

## 2019-02-25 NOTE — Progress Notes (Signed)
    Daily Group Progress Note  Program: CD-IOP   Group Time: 1-2:30pm  Participation Level: Active  Behavioral Response: Appropriate  Type of Therapy: Psycho-education Group  Topic: Clinician checked in with clients, assessing for SI/HI/psychosis and overall level of functioning. Clinician inquired about sobriety date and community support meetings attended. Psycho-educational group co-facilitated to utilize Yoga and mindfulness as a part of recovery.    Group Time: 2:30-4pm  Participation Level: Active  Behavioral Response: Appropriate, Rationalizing and Minimizing  Type of Therapy: Process Group  Topic: Clinician and group members processed any challenges over the holiday weekend. Process group focused on creating SMART goals. Clinician facilitated discussion on creation of goals to support a sober lifestyle and goals of treatment. Members challenged group members about different stages of change and group members changes and intensity of motivation for changed behaviors. Group members shared goals as well as basis behind goals of maintaining sobriety or managing use. Group members discussed how previous treatment has affected decisions as well as consequences of continued use. Clients identified specific goals and how they will be measured.  Clients identified self care activity to support recovery before next group.    Summary: [name]: goal of being hapy which includes staying out of trouble with the law. client identified alcohol causing trouble with his anger and when challenged by group members did identify areas in which alcohol use has effected his life. client would like to be able to drink 1-2 drinks on weekends, rather than daily drinking. client has a goal for the day to go the full day without drinking. client attended BBQs over the weekend where he drank more than anticipated Client notes his grirfled and family members noticed a difference and commented, which made him  feel bad as they were previously commenting how well he was doing. Client was more receptive to group member challenges this day.    Family Program: Family present? No   Name of family member(s): NA  UDS collected: Yes Results: previous UDS positive for alcohol. Client endorses ongoing use  AA/NA attended?: No  Sponsor?: No    A , LCSW, LCAS

## 2019-02-25 NOTE — Progress Notes (Signed)
Virtual Visit via Telephone Note  I connected with [name] on 02/18/2019 at  2:00 PM EDT by telephone and verified that I am speaking with the correct person using two identifiers.   I discussed the limitations, risks, security and privacy concerns of performing an evaluation and management service by telephone and the availability of in person appointments. I also discussed with the patient that there may be a patient responsible charge related to this service. The patient expressed understanding and agreed to proceed.  I discussed the assessment and treatment plan with the patient. The patient was provided an opportunity to ask questions and all were answered. The patient agreed with the plan and demonstrated an understanding of the instructions.   The patient was advised to call back or seek an in-person evaluation if the symptoms worsen or if the condition fails to improve as anticipated.  I provided 40 minutes of non-face-to-face time during this encounter.   Olegario Messier, LCSW    THERAPIST PROGRESS NOTE  Session Time: 2pm-2:40pm  Participation Level: Active  Behavioral Response: NAAlertIrritable  Type of Therapy: Individual Therapy  Treatment Goals addressed: Coping  Interventions: Motivational Interviewing  Summary: [name] is a 28 y.o. male who presents with alcohol use disorder. Client presents for individual therapy session in conjunction with CD-IOP program. Client discusses thoughts on group since start. Client reports his goal is to be able to manage is drinking. Client acknowledges his anger is often more intense and he 'has a shorter fuse' when drinking previously resulting in consequences including current legal involvement. Client is also open to discussing ways to manage his anger.   Suicidal/Homicidal: Nowithout intent/plan  Therapist Response: Clinician checked in with client, inquiring about SI/HI/psychosis and substance use.Clinician  inquired about recent changes in stressors. Clinician inquired about group and individual goals. Clinician reminded client program was abstinence based and encouraged client to attempt sobriety during treatment, even if he does not continue after program.  Plan: Return again in 1 weeks.  Diagnosis: Axis I: Alcohol Abuse      Olegario Messier, LCSW, LCAS 02/18/2019

## 2019-02-26 ENCOUNTER — Other Ambulatory Visit: Payer: Self-pay

## 2019-02-26 ENCOUNTER — Ambulatory Visit (HOSPITAL_COMMUNITY): Payer: 59 | Admitting: Licensed Clinical Social Worker

## 2019-02-26 ENCOUNTER — Other Ambulatory Visit (HOSPITAL_COMMUNITY): Payer: 59 | Admitting: Licensed Clinical Social Worker

## 2019-02-26 DIAGNOSIS — F419 Anxiety disorder, unspecified: Secondary | ICD-10-CM | POA: Diagnosis not present

## 2019-02-26 DIAGNOSIS — Z811 Family history of alcohol abuse and dependence: Secondary | ICD-10-CM

## 2019-02-26 DIAGNOSIS — F1020 Alcohol dependence, uncomplicated: Secondary | ICD-10-CM | POA: Diagnosis not present

## 2019-02-26 DIAGNOSIS — G250 Essential tremor: Secondary | ICD-10-CM

## 2019-02-26 DIAGNOSIS — R454 Irritability and anger: Secondary | ICD-10-CM | POA: Diagnosis not present

## 2019-02-26 DIAGNOSIS — F1221 Cannabis dependence, in remission: Secondary | ICD-10-CM

## 2019-02-26 DIAGNOSIS — F17200 Nicotine dependence, unspecified, uncomplicated: Secondary | ICD-10-CM

## 2019-02-26 DIAGNOSIS — Z639 Problem related to primary support group, unspecified: Secondary | ICD-10-CM

## 2019-02-26 DIAGNOSIS — F411 Generalized anxiety disorder: Secondary | ICD-10-CM

## 2019-02-26 NOTE — Progress Notes (Signed)
   [facility] Follow-up Outpatient CDIOP Date: 02/26/2019  Admission Date:02/12/2019  Sobriety date: 02/26/2019  Subjective: " I did good Monday but I drank again yesterday"  HPI: 2nd Provider FU visit CD IOP Pt seen for FU 1 week after admitting he had switched drinks and lowered quantity and was informed that CDIOP is an abstinence based program. Counselor reported patient returned to group fuming about visit. Group helped him process his anger and today he admits that it helped him to begin trying not to drink alcohol.He also reports his mother is looking into natural treatments for helping him stop.  Review of Systems: Psychiatric: Agitation: No/ has familial tremors but hard to distinguish from alcohol related Hallucination: No Depressed Mood: Does not display PHQ 2 score 0 Insomnia: No Hypersomnia: No Altered Concentration: No Feels Worthless:No change Several days in a 2 week period Grandiose Ideas: No Belief In Special Powers: No New/Increased Substance Abuse:Decreased level by history Compulsions: Alcohol use/Anger   Neurologic: Headache: No Seizure: No Paresthesias: No  Current Medications: hydrocortisone 2.5 % rectal cream Commonly known as: Anusol-HC Place 1 application rectally 3 (three) times daily.  naproxen 500 MG tablet Commonly known as: Naprosyn Take 1 tablet (500 mg total) by mouth 2 (two) times daily with a meal.     Mental Status Examination  Appearance: Alert: Yes Attention: good  Cooperative: Yes Eye Contact: Good Speech: Clear and coherent Psychomotor Activity: Normal Memory/Concentration: Normal/intact Oriented: person, place, time/date and situation Mood: Euthymic Affect: Appropriate and Congruent Thought Processes and Associations: Coherent and Intact Fund of Knowledge: Good Thought Content: WDL Insight: Good Judgement: Good  UDS:  Diagnosis:   0 Alcohol use disorder, severe, dependence (HCC) 0 Cannabis dependence in  remission ([place]) 0 Biological father as perpetrator of maltreatment and neglect 0 Family history of alcoholism in father 0 Dysfunctional family processes 0 Excessive anger 0 Nicotine use disorder 0 Anxiety state 0 Familial tremor  Assessment:Marked improvement in attitude-struggling to abstain-resisting medication  Treatment Plan: Continue per admission Discussed the danger/concerns around sudden cessation of alcohol use especially withdrawal seizure while alone. He agrees to monitor himself (with Mom's help) using CIWA Guidelines-He has a BP cuff. He agrees to seek appropriate help or continue alcohol use if necessary. Will see him next Monday.   Darlyne Russian, PA-CPatient ID: [name], male   DOB: 06-11-1991, 28 y.o.   MRN: [id number]

## 2019-02-27 ENCOUNTER — Other Ambulatory Visit: Payer: Self-pay

## 2019-02-27 ENCOUNTER — Ambulatory Visit (HOSPITAL_COMMUNITY): Payer: 59 | Admitting: Licensed Clinical Social Worker

## 2019-02-27 ENCOUNTER — Other Ambulatory Visit (HOSPITAL_COMMUNITY): Payer: 59 | Admitting: Licensed Clinical Social Worker

## 2019-02-27 DIAGNOSIS — F1020 Alcohol dependence, uncomplicated: Secondary | ICD-10-CM

## 2019-02-27 DIAGNOSIS — F419 Anxiety disorder, unspecified: Secondary | ICD-10-CM | POA: Diagnosis not present

## 2019-02-27 DIAGNOSIS — G250 Essential tremor: Secondary | ICD-10-CM | POA: Diagnosis not present

## 2019-02-27 DIAGNOSIS — R454 Irritability and anger: Secondary | ICD-10-CM | POA: Diagnosis not present

## 2019-02-27 DIAGNOSIS — F1221 Cannabis dependence, in remission: Secondary | ICD-10-CM | POA: Diagnosis not present

## 2019-02-27 DIAGNOSIS — Z811 Family history of alcohol abuse and dependence: Secondary | ICD-10-CM | POA: Diagnosis not present

## 2019-03-03 ENCOUNTER — Other Ambulatory Visit (HOSPITAL_COMMUNITY): Payer: 59

## 2019-03-03 ENCOUNTER — Telehealth (HOSPITAL_COMMUNITY): Payer: Self-pay | Admitting: Licensed Clinical Social Worker

## 2019-03-03 ENCOUNTER — Encounter (HOSPITAL_COMMUNITY): Payer: Self-pay | Admitting: Medical

## 2019-03-03 ENCOUNTER — Other Ambulatory Visit: Payer: Self-pay

## 2019-03-03 NOTE — Progress Notes (Signed)
    Daily Group Progress Note  Program: CD-IOP   Group Time: 1pm-2:30pm  Participation Level: Active  Behavioral Response: Appropriate  Type of Therapy: Process Group  Topic: Clinician checked in with clients, assessing for SI/HI/psychosis/substance use and overall level of functioning. Clinician and group members processed recent stressors, including relationships, contact with toxic people, and mixed, uncomfortable emotions as possible triggers for cravings and relapse. Clinician and group members processed stages of relapse (Emotions, thoughts, actions) and ways to interview. Clinician encouraged clients to be mindful of thoughts/feelings which come with identified triggers and attempt to intervene with alternative thoughts or distraction skills to avoid staying in uncomfortable emotions and developing cravings.   Group Time: 2:30pm-4pm  Participation Level: Active  Behavioral Response: Appropriate  Type of Therapy: Psycho-education Group  Topic: Psycho-educational group focused on healthy lifestyle. Go go-facilitated by staff from [facility]. Staff provided information of ways to improve exercise, food, sleep hygiene, and overall wellness.    Summary: Client identified verbal altercation with girlfriend and contact with his ex-girlfriend brought up mixed feelings of loss and sadness. Client states the break up was recent and not a healthy relationship, but it was long lasting so there are still feelings. Client is attempting to go multiple days without drinking. He has successfully completed one and is working on 2 days. Client reports being proud of himself and was receptive to 'one day at a time' feedback from group. Client reports he currently drinks enough water during the day due to working outside and will work on eating healthier.    Family Program: Family present? No   Name of family member(s): N/A  UDS collected: No   AA/NA attended?: No  Sponsor?:  No   Erick Murin A Emanuele Mcwhirter, LCSW, LCAS

## 2019-03-04 ENCOUNTER — Ambulatory Visit (HOSPITAL_COMMUNITY): Payer: 59 | Admitting: Licensed Clinical Social Worker

## 2019-03-04 ENCOUNTER — Other Ambulatory Visit: Payer: Self-pay

## 2019-03-05 ENCOUNTER — Other Ambulatory Visit (HOSPITAL_COMMUNITY): Payer: Self-pay | Admitting: Medical

## 2019-03-05 ENCOUNTER — Other Ambulatory Visit (HOSPITAL_COMMUNITY): Payer: 59 | Admitting: Licensed Clinical Social Worker

## 2019-03-05 ENCOUNTER — Other Ambulatory Visit: Payer: Self-pay

## 2019-03-05 DIAGNOSIS — G250 Essential tremor: Secondary | ICD-10-CM | POA: Diagnosis not present

## 2019-03-05 DIAGNOSIS — F1020 Alcohol dependence, uncomplicated: Secondary | ICD-10-CM | POA: Diagnosis not present

## 2019-03-05 DIAGNOSIS — F1221 Cannabis dependence, in remission: Secondary | ICD-10-CM | POA: Diagnosis not present

## 2019-03-05 DIAGNOSIS — Z811 Family history of alcohol abuse and dependence: Secondary | ICD-10-CM | POA: Diagnosis not present

## 2019-03-05 DIAGNOSIS — R454 Irritability and anger: Secondary | ICD-10-CM | POA: Diagnosis not present

## 2019-03-05 DIAGNOSIS — F419 Anxiety disorder, unspecified: Secondary | ICD-10-CM | POA: Diagnosis not present

## 2019-03-05 NOTE — Progress Notes (Signed)
Patient ID: [name], male   DOB: 01/03/1991, 28 y.o.   MRN: [id number]  S-Pt seen briefly to reveiw efforts to abstain-got 5 days but this weekend was triggered to drink with family issues especially father's releqase from prison.In terms of physical symptoms he says by 3rd day his anxiety and appetite were WNL for him.  O-He is alert oreientes and comprehensible. He displays a  sense of failure/shame over his "slip" and repeats his understanding that "a lot was going on.. family stuff.." General-No diaphoresis Motor exam is WNL Neuro Exam -No tremor/balance WNL  A- Learning about his alcohol use and making honest effort to try abstinence  P- Encouraged him not to feel badly. Explained learning to drink alcohol as he does is just tyat a learned process and now he is beginning to learn about that. Reviewed craving/baclofen video with him verbally. Reviewed concept of triggers and idea "if you dont want to slip dont go into slippery places" Explained he can "learn" to deal with cravings.Praised efforts. Will continue to follow per Provider FU schedule and PRN.

## 2019-03-06 ENCOUNTER — Other Ambulatory Visit (HOSPITAL_COMMUNITY): Payer: 59 | Admitting: Licensed Clinical Social Worker

## 2019-03-06 ENCOUNTER — Other Ambulatory Visit: Payer: Self-pay

## 2019-03-06 DIAGNOSIS — R454 Irritability and anger: Secondary | ICD-10-CM | POA: Diagnosis not present

## 2019-03-06 DIAGNOSIS — G250 Essential tremor: Secondary | ICD-10-CM | POA: Diagnosis not present

## 2019-03-06 DIAGNOSIS — Z811 Family history of alcohol abuse and dependence: Secondary | ICD-10-CM | POA: Diagnosis not present

## 2019-03-06 DIAGNOSIS — F1020 Alcohol dependence, uncomplicated: Secondary | ICD-10-CM | POA: Diagnosis not present

## 2019-03-06 DIAGNOSIS — F1221 Cannabis dependence, in remission: Secondary | ICD-10-CM | POA: Diagnosis not present

## 2019-03-06 DIAGNOSIS — F419 Anxiety disorder, unspecified: Secondary | ICD-10-CM | POA: Diagnosis not present

## 2019-03-07 NOTE — Progress Notes (Incomplete)
    Daily Group Progress Note  Program: {CHL AMB BH IOP/CDIOP Program Type:21022744}   Group Time: ***  Participation Level: {CHL AMB BH Group Participation:21022742}  Behavioral Response: {CHL AMB BH Group Behavior:21022743}  Type of Therapy: {CHL AMB BH Type of Therapy:21022741}  Topic: Clinician checked in with group members, assessing for SI/HI/psychosis and overall level of functioning. Clinician and group members discussed recent UDS results and level of motivation for achieving or maintaining sobriety. Clinician and group members processed the pros and cons of 'Addictive Lifestyle' vs 'Recovery Lifestyle.' Clinician and group members processed possible gains and losses related to each, including effects on finances, feelings, social aspects, and sense of self.    Group Time: ***  Participation Level: {CHL AMB BH Group Participation:21022742}  Behavioral Response: {CHL AMB BH Group Behavior:21022743}  Type of Therapy: {CHL AMB BH Type of Therapy:21022741}  Topic: Clinician and group members completed creations of SMART goals. Clinician and group members shared resources available for clients to help support recovery and ways to be held accountable.    Summary: Processed frustration with group related to feeling 'lectured' by PA.   Family Program: Family present? {BHH YES OR NO:22294}   Name of family member(s): ***  UDS collected: {BHH YES OR NO:22294} Results: {Findings; urine drug screen:60936}  AA/NA attended?: {BHH YES OR NO:22294}{DAYS OF VCBS:49675}  Sponsor?: {BHH YES OR FF:63846}   Olegario Messier, LCSW

## 2019-03-10 ENCOUNTER — Other Ambulatory Visit (HOSPITAL_COMMUNITY): Payer: 59 | Admitting: Licensed Clinical Social Worker

## 2019-03-10 ENCOUNTER — Other Ambulatory Visit: Payer: Self-pay

## 2019-03-10 DIAGNOSIS — F1020 Alcohol dependence, uncomplicated: Secondary | ICD-10-CM

## 2019-03-10 DIAGNOSIS — F419 Anxiety disorder, unspecified: Secondary | ICD-10-CM | POA: Diagnosis not present

## 2019-03-10 DIAGNOSIS — F1920 Other psychoactive substance dependence, uncomplicated: Secondary | ICD-10-CM | POA: Diagnosis not present

## 2019-03-10 DIAGNOSIS — F1221 Cannabis dependence, in remission: Secondary | ICD-10-CM

## 2019-03-10 DIAGNOSIS — G250 Essential tremor: Secondary | ICD-10-CM | POA: Diagnosis not present

## 2019-03-10 DIAGNOSIS — R454 Irritability and anger: Secondary | ICD-10-CM | POA: Diagnosis not present

## 2019-03-10 DIAGNOSIS — Z811 Family history of alcohol abuse and dependence: Secondary | ICD-10-CM | POA: Diagnosis not present

## 2019-03-11 ENCOUNTER — Ambulatory Visit (HOSPITAL_COMMUNITY): Payer: 59 | Admitting: Licensed Clinical Social Worker

## 2019-03-11 ENCOUNTER — Telehealth: Payer: Self-pay | Admitting: Orthopaedic Surgery

## 2019-03-11 ENCOUNTER — Other Ambulatory Visit: Payer: Self-pay

## 2019-03-11 DIAGNOSIS — F1221 Cannabis dependence, in remission: Secondary | ICD-10-CM

## 2019-03-11 DIAGNOSIS — F1020 Alcohol dependence, uncomplicated: Secondary | ICD-10-CM

## 2019-03-11 NOTE — Progress Notes (Signed)
    Daily Group Progress Note  Program: CD-IOP   Group Time: 1pm-2:30pm  Participation Level: Active  Behavioral Response: Appropriate  Type of Therapy: Psycho-education Group  Topic: Clinician checked in with group members, assessing for SI/HI/psychosis and overall level of functioning. Psycho-educational portion of group facilitated by pharmacist. Pharmacist provided educational information related to mental health prescriptions as well as prescription medication which address some symptoms of cravings related to alcohol and opioid use.   Group Time: 2:30pm-4pm  Participation Level: Active  Behavioral Response: Appropriate  Type of Therapy: Process Group  Topic: Process group focused on continuing to identify high risk situations and developing discrepancies of situations identified, but no behavior changes attempted. Clinician facilities discussion on pros and cons of substitution, urge surfing, mindful breathing, and distraction as ways to help manage when high risk situations cannot be avoided, such as at work, or death of a loved one. Clinician reminded group members of the Opposite Action Skill.   Summary: Client reports a desire for 'things to change' in his life and identifies changes which could be made, however continues to provide excuses for lack of change. Client is somewhat receptive to feedback from the group, though admits that how he is being spoken to by some group members 'makes me want to drink more.' Client shares stress from family members, girlfriend, and ex-girlfriend to address drinking.   Family Program: Family present? No   Name of family member(s): N/A  UDS collected: No Results: Ethyl Glucuronide, Ethyl Sulfate  AA/NA attended?: No  Sponsor?: No   Satya Bohall A Ellanie Oppedisano, LCSW, LCAS

## 2019-03-11 NOTE — Progress Notes (Signed)
Virtual Visit via Telephone Note  I connected with [name] on 03/11/19 at  2:00 PM EDT by telephone and verified that I am speaking with the correct person using two identifiers.   I discussed the limitations, risks, security and privacy concerns of performing an evaluation and management service by telephone and the availability of in person appointments. I also discussed with the patient that there may be a patient responsible charge related to this service. The patient expressed understanding and agreed to proceed.   I discussed the assessment and treatment plan with the patient. The patient was provided an opportunity to ask questions and all were answered. The patient agreed with the plan and demonstrated an understanding of the instructions.   The patient was advised to call back or seek an in-person evaluation if the symptoms worsen or if the condition fails to improve as anticipated.  I provided 20 minutes of non-face-to-face time during this encounter.   Olegario Messier, LCSW    THERAPIST PROGRESS NOTE  Session Time: 2:15-2:40  Weekly individual session linked with CDIOP program  Participation Level: Active  Behavioral Response: NAAlertDysphoric and Irritable  Type of Therapy: Individual Therapy  Treatment Goals addressed: Anger, Coping and Diagnosis: Alcohol Use Disorder  Interventions: Motivational Interviewing and Supportive  Summary: SORREN VALLIER is a 28 y.o. male who presents with alcohol use disorder, last use on 03/10/2019. Client reports stress the previous evening at home, "I went home and I did me. I got in my feelings pretty hard, had some bad thoughts." Client has had recent passive SI. Client reports having 5 drinks previous night and after sitting in group states "I have a problem and can't do it [getting sober] by myself." Client also reports his girlfriend is seeking treatment for drinking as well and this has no effect on his desire to stop  drinking. Client stated he does want to 'give this lady from work a chance' despite being encouraged in group to avoid relationships in focus on sobriety from group members. Client is interested in 'setting things up with work so I don't lose it all' and seeking options for residential or inpatient treatment. Client notes he does not want 'to be locked up' but is open to reviewing options for treatment short and long term, noting having a friend who completed treatment in [place].   Suicidal/Homicidal: Nowithout intent/plan  Therapist Response: Clinician assessed for SI/HI/psychosis and substance use. Clinician inquired about day/evening following group and related stressors and responses. Clinician offered to provide client with resources for treatment  Plan: Return again in 1 weeks.  Diagnosis: Axis I: Alcohol Abuse      Olegario Messier, LCSW 03/11/2019
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
## 2019-03-12 ENCOUNTER — Other Ambulatory Visit (HOSPITAL_COMMUNITY): Payer: 59 | Admitting: Licensed Clinical Social Worker

## 2019-03-12 ENCOUNTER — Encounter (HOSPITAL_COMMUNITY): Payer: Self-pay | Admitting: Medical

## 2019-03-12 ENCOUNTER — Other Ambulatory Visit: Payer: Self-pay

## 2019-03-12 DIAGNOSIS — F1020 Alcohol dependence, uncomplicated: Secondary | ICD-10-CM

## 2019-03-12 DIAGNOSIS — G250 Essential tremor: Secondary | ICD-10-CM

## 2019-03-12 DIAGNOSIS — R454 Irritability and anger: Secondary | ICD-10-CM

## 2019-03-12 DIAGNOSIS — F17200 Nicotine dependence, unspecified, uncomplicated: Secondary | ICD-10-CM

## 2019-03-12 DIAGNOSIS — F1221 Cannabis dependence, in remission: Secondary | ICD-10-CM

## 2019-03-12 DIAGNOSIS — Z639 Problem related to primary support group, unspecified: Secondary | ICD-10-CM

## 2019-03-12 NOTE — Progress Notes (Signed)
Pt arrived today and admits he drank uncontrollably last night-he is fatigued and ashamed but has been discovering he cannot stop on his own. He requests to be excused from group to go get some sleep. He is encouraged to do so and consider entering [facility] for 2 weeks to get withdrawn from active use.He agrees to return tomorrow and let Counselor know about his decision. He may not be able to continue in IOP with his current ongoing use and be discharged to a higher level of care but will continue to monitor.to see if he will go voluntarily.Can consider [facility] detox with return here as alternative if he cannot see his way to weeks of treatment.

## 2019-03-13 ENCOUNTER — Ambulatory Visit (INDEPENDENT_AMBULATORY_CARE_PROVIDER_SITE_OTHER): Payer: 59

## 2019-03-13 ENCOUNTER — Other Ambulatory Visit: Payer: Self-pay

## 2019-03-13 ENCOUNTER — Ambulatory Visit (INDEPENDENT_AMBULATORY_CARE_PROVIDER_SITE_OTHER): Payer: 59 | Admitting: Orthopaedic Surgery

## 2019-03-13 ENCOUNTER — Other Ambulatory Visit (HOSPITAL_COMMUNITY): Payer: 59 | Admitting: Licensed Clinical Social Worker

## 2019-03-13 ENCOUNTER — Ambulatory Visit: Payer: 59 | Admitting: Orthopaedic Surgery

## 2019-03-13 ENCOUNTER — Encounter: Payer: Self-pay | Admitting: Orthopaedic Surgery

## 2019-03-13 VITALS — BP 127/96 | HR 65 | Temp 98.0°F | Ht 72.0 in | Wt 188.0 lb

## 2019-03-13 DIAGNOSIS — F1221 Cannabis dependence, in remission: Secondary | ICD-10-CM

## 2019-03-13 DIAGNOSIS — S4991XA Unspecified injury of right shoulder and upper arm, initial encounter: Secondary | ICD-10-CM

## 2019-03-13 DIAGNOSIS — F1020 Alcohol dependence, uncomplicated: Secondary | ICD-10-CM

## 2019-03-13 DIAGNOSIS — Z811 Family history of alcohol abuse and dependence: Secondary | ICD-10-CM | POA: Diagnosis not present

## 2019-03-13 DIAGNOSIS — R454 Irritability and anger: Secondary | ICD-10-CM | POA: Diagnosis not present

## 2019-03-13 DIAGNOSIS — F419 Anxiety disorder, unspecified: Secondary | ICD-10-CM | POA: Diagnosis not present

## 2019-03-13 DIAGNOSIS — G250 Essential tremor: Secondary | ICD-10-CM | POA: Diagnosis not present
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
## 2019-03-14 NOTE — Progress Notes (Signed)
    Daily Group Progress Note  Program: CD-IOP   Group Time: 1pm-2:30pm  Participation Level: Active  Behavioral Response: Appropriate  Type of Therapy: Process Group  Topic: Clinician checked in with group members, assessing for SI/HI/psychosis and substance use. Clinician and group members processed triggers since previous meeting. Clinician encouraged clients to present concerns to the group and request feedback. Clinician and group members continued to process levels of motivation and clinician developed discrepancies between works and actions. Clinician facilitated discussion about the changing roles of parents and the role they play. Clinician and group members also processed daily reflection with a focus on humility.   Group Time: 2:30pm-4pm  Participation Level: Active  Behavioral Response: Appropriate  Type of Therapy: Psycho-education Group  Topic: Psycho-educational group focused on the connection of thoughts, feelings, and behaviors. Clinician and group members discussed automatic thoughts and changing automatic addictive thinking to recovery thinking statements. Clinician and group members reviewed process of challenging distorted thoughts and developing new thought patterns.    Summary: Sobriety date of 03/06/19. Client reports continued struggle with relationships with mother, girlfriend, and ex-girlfriend. Client endorses continued alcohol use and unwillingness at this time to attend a residential program, despite almost daily drinking. Client shares how he feels he often raised himself when younger when his mother was drinking or using and he began dealing. Client voiced frustration that she is now 'trying to be a mom, but I'm 4 years old now.' Client is unable to identify what would make him feel supported outside of group.    Family Program: Family present? No   Name of family member(s): N/A; mother recently called with concerns.  UDS collected: No Results:  alcohol per last UDS  AA/NA attended?: No. Client continues to be encouraged by group members and offered to attend together however continues to decline despite being part of group.  Sponsor?: No  Micheline Chapman, LCSW, LCAS

## 2019-03-17 ENCOUNTER — Other Ambulatory Visit (HOSPITAL_COMMUNITY): Payer: 59

## 2019-03-17 ENCOUNTER — Other Ambulatory Visit: Payer: Self-pay

## 2019-03-17 NOTE — Addendum Note (Signed)
Addended by: Dara Hoyer on: 03/17/2019 01:49 PM   Modules accepted: Level of Service

## 2019-03-18 ENCOUNTER — Ambulatory Visit (INDEPENDENT_AMBULATORY_CARE_PROVIDER_SITE_OTHER): Payer: 59 | Admitting: Licensed Clinical Social Worker

## 2019-03-18 ENCOUNTER — Other Ambulatory Visit: Payer: Self-pay

## 2019-03-18 DIAGNOSIS — F1020 Alcohol dependence, uncomplicated: Secondary | ICD-10-CM | POA: Diagnosis not present

## 2019-03-18 NOTE — Progress Notes (Signed)
    Daily Group Progress Note  Program: CD-IOP   Group Time: 1pm-2:30pm  Participation Level: Active  Behavioral Response: Appropriate  Type of Therapy: Process Group  Topic: Clinician met with group members, assessing for SI/HI/psychosis and overall level of functioning. Clinician and group members processed changes in stressors of the weekend and worked on identifying thoughts and feelings related to cravings. Clinician and group members completed daily reading and processed the topic of humility and acceptance.   Group Time: 2:30pm-4pm  Participation Level: Active  Behavioral Response: Appropriate  Type of Therapy: Psycho-education Group  Topic: Clinician and group members discussed the benefits of 12-Step groups and members shared experiences. Clinician reviewed with group members the connection between thoughts, feelings, and behaviors and how addressing, or not addressing automatic thoughts can effect craving intensities. Clinician and group members completed Automatic thought, both addictive thinking and recovery thinking.   Summary: Client engaged in group discussions. Client was receptive to feedback from group members related to addressing cravings or seeking a higher level of care. Client shared struggle with continued drinking and effect of emotions on excessive drinking over the weekend. Clinician attempts to develop discrepancies between clients continued drinking and not wanting support to achieve or maintain sobriety.     Family Program: Family present? No   Name of family member(s): N/A  UDS collected: Yes   AA/NA attended?: No  Sponsor?: No   Masyn Fullam A Oliviagrace Crisanti, LCSW, LCAS

## 2019-03-18 NOTE — Progress Notes (Signed)
Virtual Visit via Telephone Note  I connected with [name] on 03/18/19 at  2:00 PM EDT by telephone and verified that I am speaking with the correct person using two identifiers.   I discussed the limitations, risks, security and privacy concerns of performing an evaluation and management service by telephone and the availability of in person appointments. I also discussed with the patient that there may be a patient responsible charge related to this service. The patient expressed understanding and agreed to proceed.  I discussed the assessment and treatment plan with the patient. The patient was provided an opportunity to ask questions and all were answered. The patient agreed with the plan and demonstrated an understanding of the instructions.   The patient was advised to call back or seek an in-person evaluation if the symptoms worsen or if the condition fails to improve as anticipated.  I provided 30 minutes of non-face-to-face time during this encounter.   Olegario Messier, LCSW    THERAPIST PROGRESS NOTE  Session Time: 2pm-2:35pm  Participation Level: Active  Behavioral Response: NAAlertAnxious, Hopeless and Irritable  Type of Therapy: Individual Therapy; Individual session in conjunction with CDIOP program.  Treatment Goals addressed: Anger, Coping and Diagnosis: Alcohol Use Disorder  Interventions: Motivational Interviewing and Supportive  Summary: [name] is a 28 y.o. male who presents with alcohol use disorder. Client denies the need for detox, reporting 'I'm not fiending for it and I'm not shaking or withdrawing so the hospital won't take me." Client reports court went well and the case got continued however he is unsure how not successfully completing IOP program will effect his case. Client repeats the progress he's made and that 'if I would have known 2 beers would have cost me the program I would not have had them.' Client acknowledges the program is  abstinence based, though does not agree with treatment team discussion to recommend a higher level of care due to client's inability to completely stop alcohol use. Client states the group made him realize he wants to change and the effect alcohol had on his life, but that if he is going to be judged by the program, 'why not be who you think I am.' Client was receptive to feedback about continuing treatment based on progress made so far but does not want to 'get locked up' at an inpatient facility. Client agreed to contact [facility] and [facility] to discuss options for detox or 14 day inpatient program.  Suicidal/Homicidal: Nowithout intent/plan  Therapist Response: Clinician met with client via telehealth. Clinician assessed for SI/HI/psychosis and overall level of functioning. Clinican inquired about results of court this day. Clinician discussed with client treatment team decision to recommend a higher level of care due to client inability to achieve and maintain sobriety during program. Clinician provided contact information for [facility] and [facility]. Clinician requested client contact agencies and contact clinician with results.  Plan: Return date pending changes in level of care.  Diagnosis: Axis I: Alcohol Abuse      Olegario Messier, LCSW 03/18/2019

## 2019-03-19 ENCOUNTER — Encounter (HOSPITAL_COMMUNITY): Payer: 59

## 2019-03-20 ENCOUNTER — Encounter (HOSPITAL_COMMUNITY): Payer: 59

## 2019-03-20 NOTE — Progress Notes (Signed)
[name] is a 28 y.o. male patient with alcohol use disorder. See PA note for details. Client has not made progress toward goals and is being considered for the referral to a higher level of care rather than continuation of current program.     Olegario Messier, LCSW

## 2019-03-25 ENCOUNTER — Telehealth (HOSPITAL_COMMUNITY): Payer: Self-pay | Admitting: Licensed Clinical Social Worker

## 2019-03-25 NOTE — Telephone Encounter (Signed)
Provided client and family with contact information for [facility], [facility], [facility], and [facility]

## 2019-03-25 NOTE — Telephone Encounter (Signed)
Provided information to client aunt on how to access residential services

## 2019-03-25 NOTE — Telephone Encounter (Signed)
Provided contact information for [facility] and how to access medical records including releases of information needed from a client

## 2019-03-27 ENCOUNTER — Encounter: Payer: 59 | Admitting: Psychology

## 2019-03-27 ENCOUNTER — Encounter

## 2019-03-27 DIAGNOSIS — Z03818 Encounter for observation for suspected exposure to other biological agents ruled out: Secondary | ICD-10-CM | POA: Diagnosis not present

## 2019-03-27 DIAGNOSIS — F1020 Alcohol dependence, uncomplicated: Secondary | ICD-10-CM | POA: Diagnosis not present

## 2019-03-28 DIAGNOSIS — Z03818 Encounter for observation for suspected exposure to other biological agents ruled out: Secondary | ICD-10-CM | POA: Diagnosis not present

## 2019-04-01 DIAGNOSIS — F1020 Alcohol dependence, uncomplicated: Secondary | ICD-10-CM | POA: Diagnosis not present

## 2019-04-01 NOTE — Progress Notes (Signed)
    Daily Group Progress Note  Program: CD-IOP   Group Time: 1pm-4pm  Participation Level: Active  Behavioral Response: Appropriate  Type of Therapy: Group Therapy  Topic:  Clinician checked in with group members assessing for SI/HI/psychosis and overall level of functioning. Clinician reviewed sobriety dates and AA/NA meeting attendance. Clinician and group members discussed highs and lows from previous day. Clinician and group members read and processed Reflection of the Day with a focus on addressing fear. Clinician and group members processed common fears in addiction recovery and how to face them.  Clinician and group members participated in 'draw your addiction monster' activity and shared. Clinician and group members reviewed skills for coping with Urges with support material from SMART Recovery.    Summary: Client engaged in group discussion appropriately, minimizing symptoms of fear, cravings, and ability to meet goals. Client's goal remains being able to manage his drinking, not maintain long term sobriety.   Family Program: Family present? No   Name of family member(s): N/A     AA/NA attended?: No. Client continues to decline need for community support groups despite struggle with cravings outside of group    Olegario Messier, LCSW

## 2019-04-02 DIAGNOSIS — F1020 Alcohol dependence, uncomplicated: Secondary | ICD-10-CM | POA: Diagnosis not present

## 2019-04-14 ENCOUNTER — Telehealth (HOSPITAL_COMMUNITY): Payer: Self-pay | Admitting: Psychiatry

## 2019-04-14 NOTE — Telephone Encounter (Signed)
D:  Austin from [facility] in [place], [place] called referring pt back to CD-IOP.  Pt will be discharged on 04-18-19.  A:  Pt is scheduled to return to CD-IOP on 04-21-19.  Requested pt to arrive at 12:30 pm for re-orientation, etc.  Also, requested that chart information be faxed from their facility before pt starts next Monday.

## 2019-04-21 ENCOUNTER — Encounter (HOSPITAL_COMMUNITY): Payer: Self-pay | Admitting: Medical

## 2019-04-21 ENCOUNTER — Other Ambulatory Visit (HOSPITAL_COMMUNITY): Payer: 59 | Attending: Medical | Admitting: Medical

## 2019-04-21 ENCOUNTER — Other Ambulatory Visit: Payer: Self-pay

## 2019-04-21 VITALS — BP 122/68 | HR 78 | Temp 98.1°F | Resp 16 | Ht 72.0 in | Wt 197.0 lb

## 2019-04-21 DIAGNOSIS — F1411 Cocaine abuse, in remission: Secondary | ICD-10-CM | POA: Insufficient documentation

## 2019-04-21 DIAGNOSIS — F411 Generalized anxiety disorder: Secondary | ICD-10-CM

## 2019-04-21 DIAGNOSIS — F1020 Alcohol dependence, uncomplicated: Secondary | ICD-10-CM | POA: Diagnosis not present

## 2019-04-21 DIAGNOSIS — F1920 Other psychoactive substance dependence, uncomplicated: Secondary | ICD-10-CM | POA: Diagnosis not present

## 2019-04-21 DIAGNOSIS — F17200 Nicotine dependence, unspecified, uncomplicated: Secondary | ICD-10-CM | POA: Diagnosis not present

## 2019-04-21 DIAGNOSIS — R454 Irritability and anger: Secondary | ICD-10-CM

## 2019-04-21 DIAGNOSIS — G250 Essential tremor: Secondary | ICD-10-CM | POA: Diagnosis not present

## 2019-04-21 DIAGNOSIS — F1221 Cannabis dependence, in remission: Secondary | ICD-10-CM

## 2019-04-21 DIAGNOSIS — Z639 Problem related to primary support group, unspecified: Secondary | ICD-10-CM

## 2019-04-21 DIAGNOSIS — F17210 Nicotine dependence, cigarettes, uncomplicated: Secondary | ICD-10-CM | POA: Insufficient documentation
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
## 2019-04-23 ENCOUNTER — Other Ambulatory Visit (HOSPITAL_COMMUNITY): Payer: 59 | Attending: Medical | Admitting: Licensed Clinical Social Worker

## 2019-04-23 ENCOUNTER — Other Ambulatory Visit: Payer: Self-pay

## 2019-04-23 DIAGNOSIS — F1020 Alcohol dependence, uncomplicated: Secondary | ICD-10-CM | POA: Insufficient documentation

## 2019-04-23 DIAGNOSIS — Z639 Problem related to primary support group, unspecified: Secondary | ICD-10-CM | POA: Insufficient documentation

## 2019-04-23 DIAGNOSIS — R454 Irritability and anger: Secondary | ICD-10-CM | POA: Insufficient documentation

## 2019-04-23 DIAGNOSIS — F411 Generalized anxiety disorder: Secondary | ICD-10-CM | POA: Insufficient documentation

## 2019-04-23 DIAGNOSIS — F17200 Nicotine dependence, unspecified, uncomplicated: Secondary | ICD-10-CM | POA: Diagnosis not present

## 2019-04-23 DIAGNOSIS — F1221 Cannabis dependence, in remission: Secondary | ICD-10-CM | POA: Insufficient documentation

## 2019-04-23 DIAGNOSIS — G250 Essential tremor: Secondary | ICD-10-CM | POA: Insufficient documentation

## 2019-04-24 ENCOUNTER — Other Ambulatory Visit (HOSPITAL_COMMUNITY): Payer: 59 | Admitting: Licensed Clinical Social Worker

## 2019-04-24 DIAGNOSIS — F1221 Cannabis dependence, in remission: Secondary | ICD-10-CM | POA: Diagnosis not present

## 2019-04-24 DIAGNOSIS — F1020 Alcohol dependence, uncomplicated: Secondary | ICD-10-CM | POA: Diagnosis not present

## 2019-04-24 DIAGNOSIS — Z639 Problem related to primary support group, unspecified: Secondary | ICD-10-CM | POA: Diagnosis not present

## 2019-04-24 DIAGNOSIS — F411 Generalized anxiety disorder: Secondary | ICD-10-CM | POA: Diagnosis not present

## 2019-04-24 DIAGNOSIS — G250 Essential tremor: Secondary | ICD-10-CM | POA: Diagnosis not present

## 2019-04-24 DIAGNOSIS — F17200 Nicotine dependence, unspecified, uncomplicated: Secondary | ICD-10-CM | POA: Diagnosis not present

## 2019-04-24 DIAGNOSIS — R454 Irritability and anger: Secondary | ICD-10-CM | POA: Diagnosis not present

## 2019-04-24 NOTE — Progress Notes (Signed)
    Daily Group Progress Note  Program: CD-IOP   Group Time: 1pm-2:30pm  Participation Level: Active  Behavioral Response: Appropriate  Type of Therapy: Process Group  Topic: Clinician checked in with group members, assessing for SI/HI/psychosis and overall level of functioning including substance use and community support and meeting attendance. Clinician and group members processed successes and challenges since last group as well as any noticing willfulness since the last group's topic. Clinician and group members read and discussed Daily Reflection and it's application to current individual recovery process. Clinician provided mindfulness meditation 'A Handful of Quiet.'   Group Time: 2:30pm-4pm  Participation Level: Active  Behavioral Response: Appropriate  Type of Therapy: Psycho-education Group  Topic: Clinician presented the topic 'Spirituality In Recovery.' Clinician and group members processed what spirituality means and how this is impacting their recovery. Clinician encouraged group members to consider what messages they received about spirituality growing up, and where they are not in contrast to view of spirituality when actively using. Clinician and group members discussed the importance of connection and ways to implement this in daily life, including meditation, helping others, attending meetings, gratitude, creative work, and using mindfulness during routine activities.   Summary: Client reports willfulness related to 12 step group attendance and recognizes excuses being used but identifies CDIOP in place of 12 step groups. Client states he does have a friend who is sober who he plans to link with for support. Client reports mind wandering during meditation related to legal involvement and work which made it difficult to focus on mindfulness activity. Client identifies previously being spiritual but not religious growing up. Client has a goal to pray when he gets up and  prior to going to bed.   Family Program: Family present? No    UDS collected: No  AA/NA attended?: No. States will attend at least one meeting between Group Thursday and group Monday. Client was remeinded attending community meetings is a part of [place] group standards   Sponsor?: No   Bitania Shankland A Sherina Stammer, LCSW, LCAS

## 2019-04-29 NOTE — Progress Notes (Signed)
    Daily Group Progress Note  Program: CD-IOP   Group Time: 1pm-2:30pm  Participation Level: Active  Behavioral Response: Appropriate  Type of Therapy: Process Group  Topic: Clinician checked in with group members, assessing for SI/HI/psychosis and overall level of functioning including substance use and community support group meeting attendance. Clinician and group members discussed follow up thoughts of willingness and spirituality in recovery as discussed in previous groups. Clinician presented clients with the activity of completing a personal Mission statement. Clinician facilitated clients processing  statements, focusing on which sections were most and least difficult and the reason. Clinician actively listened to clients, praising willingness to be open. Clinician and group members discussed specific parts of acknowledgement and acceptance related to behaviors and changes.   Group Time: 2:30pm-4pm  Participation Level: Active  Behavioral Response: Appropriate  Type of Therapy: Psycho-education Group  Topic: Clinician provided clients with the skill of Progressive Muscle Relaxation and engage group in mindfulness exercise. Clinician provided psycho-education on the effects of substances on the body and brain chemistry as well as motivation level. Clinician and group members utilized 'Popsicle Sticks' to relate common phrases in recovery to current situations. Group member topics included intimacy, letting go of control, higher power, family, and willingness. Clinician inquired about self care plans over the holiday weekend.   Summary: Client sobriety date remains 03/25/19 with no AA mtgs attended. Client reports spending time with this girlfriend for her birthday and plans to attend gathering for his birthday this weekend with family members who do not drink alcohol or use drugs. Client was proud of negative UDS. Client shared thoughts related to MAT. Client received the topic of  willingness from popsicle stick activity which he notes is ironic based on recent willful behaviors. Client reports missing the support of 12 step groups while in residential treatment however believes it will be more difficult to find a comfortable home work now. Client is receptive to encouragement of attempting different groups to find a good fit.   Family Program: Family present? No    UDS collected: No Results: negative  AA/NA attended?: No  Sponsor?: No   Rajendra Spiller A Khiree Bukhari, LCSW, LCAS

## 2019-04-30 ENCOUNTER — Other Ambulatory Visit (HOSPITAL_COMMUNITY): Payer: 59 | Admitting: Licensed Clinical Social Worker

## 2019-04-30 ENCOUNTER — Other Ambulatory Visit: Payer: Self-pay

## 2019-04-30 DIAGNOSIS — F1020 Alcohol dependence, uncomplicated: Secondary | ICD-10-CM | POA: Diagnosis not present

## 2019-04-30 DIAGNOSIS — F411 Generalized anxiety disorder: Secondary | ICD-10-CM | POA: Diagnosis not present

## 2019-04-30 DIAGNOSIS — Z639 Problem related to primary support group, unspecified: Secondary | ICD-10-CM | POA: Diagnosis not present

## 2019-04-30 DIAGNOSIS — G250 Essential tremor: Secondary | ICD-10-CM | POA: Diagnosis not present

## 2019-04-30 DIAGNOSIS — F17200 Nicotine dependence, unspecified, uncomplicated: Secondary | ICD-10-CM | POA: Diagnosis not present

## 2019-04-30 DIAGNOSIS — F1120 Opioid dependence, uncomplicated: Secondary | ICD-10-CM | POA: Diagnosis not present

## 2019-04-30 DIAGNOSIS — F1221 Cannabis dependence, in remission: Secondary | ICD-10-CM

## 2019-04-30 DIAGNOSIS — R454 Irritability and anger: Secondary | ICD-10-CM | POA: Diagnosis not present

## 2019-05-01 ENCOUNTER — Other Ambulatory Visit (HOSPITAL_COMMUNITY): Payer: 59

## 2019-05-05 ENCOUNTER — Other Ambulatory Visit (HOSPITAL_COMMUNITY): Payer: 59 | Admitting: Licensed Clinical Social Worker

## 2019-05-05 ENCOUNTER — Ambulatory Visit (INDEPENDENT_AMBULATORY_CARE_PROVIDER_SITE_OTHER): Payer: 59 | Admitting: Licensed Clinical Social Worker

## 2019-05-05 ENCOUNTER — Other Ambulatory Visit: Payer: Self-pay

## 2019-05-05 DIAGNOSIS — F1120 Opioid dependence, uncomplicated: Secondary | ICD-10-CM | POA: Diagnosis not present

## 2019-05-05 DIAGNOSIS — F1020 Alcohol dependence, uncomplicated: Secondary | ICD-10-CM

## 2019-05-05 DIAGNOSIS — F1221 Cannabis dependence, in remission: Secondary | ICD-10-CM

## 2019-05-05 NOTE — Progress Notes (Signed)
   THERAPIST PROGRESS NOTE  Session Time: 4:05pm-4:40pm  Participation Level: Active  Behavioral Response: CasualAlertEuthymic  Type of Therapy: Individual Therapy  Treatment Goals addressed: Coping and Diagnosis: Client will achieve and maintain sobriety 7/7 days per week. Client will use healthy coping skills in place of alcohol at least 1 time per day at least 4 days per week.  Interventions: Motivational Interviewing and Supportive  Summary: [name] is a 28 y.o. male who presents with alcohol use disorder. Client meets with clinician for individual session in addition to group therapy for CDIOP program.  Suicidal/Homicidal: Nowithout intent/plan  Therapist Response: Clinician process pros and cons of not returning to second job after [place] assesses client's motivation for change based on lack of attendance in community meetings and continued socialization with 'old people and places.'  Plan: Return again in 1-2 weeks.  Diagnosis: Axis I: Alcohol Abuse       Olegario Messier, LCSW 05/05/2019

## 2019-05-05 NOTE — Progress Notes (Signed)
    Daily Group Progress Note  Program: CD-IOP   Group Time: 1pm-2:30pm  Participation Level: Active  Behavioral Response: Appropriate  Type of Therapy: Process Group  Topic: Clinician checked in with group members, assessing for SI/HI/psychosis and overall level of functioning including relapse and barriers to recovery. Clinician and group members discussed highlights and struggles since last group related to maintaining sobriety including identified triggers and responses. Clinician and group members processed Daily Reflection and  personal meeting to current work in recovery.    Group Time: 2:30pm-4pm  Participation Level: Active  Behavioral Response: Appropriate  Type of Therapy: Psycho-education Group  Topic: Clinician provided mindfulness activity in session for participation of clients. Clinician presented the psycho-education topic of Resentment and Forgiveness. Clinician and group members discussed roles of resentment in early recovery. Clinician and group members identified a small resentment and described related thoughts, behaviors, and physical sensations.    Summary: Client sobriety date remains 03/25/19.Client engaged in group discussions. Client shared differences in celebrating birthdays sober. Client discussed overwhelming resentment related to law enforcement and provided several options supporting his thoughts and feelings. Client identified related thoughts, feelings and physical sensations. Client is able to identify other times he has felt this way in order to connect past and present however remained focused on resentment he did not wish to resolve. Client denied this currently has an effect on his recovery but did previously have an effect on reasons to be intoxicated.   Family Program: Family present? No   Name of family member(s): N/A  UDS collected: Yes Results: tobacco  AA/NA attended?: No  Sponsor?: No   Ricardo Kayes A Lemond Griffee, LCSW, LCAS

## 2019-05-07 ENCOUNTER — Encounter (HOSPITAL_COMMUNITY): Payer: Self-pay | Admitting: Medical

## 2019-05-07 ENCOUNTER — Other Ambulatory Visit (INDEPENDENT_AMBULATORY_CARE_PROVIDER_SITE_OTHER): Payer: 59 | Admitting: Licensed Clinical Social Worker

## 2019-05-07 ENCOUNTER — Other Ambulatory Visit: Payer: Self-pay

## 2019-05-07 DIAGNOSIS — F1020 Alcohol dependence, uncomplicated: Secondary | ICD-10-CM

## 2019-05-07 DIAGNOSIS — F17200 Nicotine dependence, unspecified, uncomplicated: Secondary | ICD-10-CM

## 2019-05-07 DIAGNOSIS — Z639 Problem related to primary support group, unspecified: Secondary | ICD-10-CM

## 2019-05-07 DIAGNOSIS — G250 Essential tremor: Secondary | ICD-10-CM

## 2019-05-07 DIAGNOSIS — Z811 Family history of alcohol abuse and dependence: Secondary | ICD-10-CM

## 2019-05-07 DIAGNOSIS — F1221 Cannabis dependence, in remission: Secondary | ICD-10-CM

## 2019-05-07 DIAGNOSIS — Z6372 Alcoholism and drug addiction in family: Secondary | ICD-10-CM

## 2019-05-07 DIAGNOSIS — R454 Irritability and anger: Secondary | ICD-10-CM

## 2019-05-07 DIAGNOSIS — F411 Generalized anxiety disorder: Secondary | ICD-10-CM
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
## 2019-05-08 ENCOUNTER — Other Ambulatory Visit (HOSPITAL_COMMUNITY): Payer: 59 | Admitting: Licensed Clinical Social Worker

## 2019-05-08 DIAGNOSIS — Z639 Problem related to primary support group, unspecified: Secondary | ICD-10-CM

## 2019-05-08 DIAGNOSIS — F411 Generalized anxiety disorder: Secondary | ICD-10-CM | POA: Diagnosis not present

## 2019-05-08 DIAGNOSIS — R454 Irritability and anger: Secondary | ICD-10-CM | POA: Diagnosis not present

## 2019-05-08 DIAGNOSIS — F1221 Cannabis dependence, in remission: Secondary | ICD-10-CM | POA: Diagnosis not present

## 2019-05-08 DIAGNOSIS — F1020 Alcohol dependence, uncomplicated: Secondary | ICD-10-CM | POA: Diagnosis not present

## 2019-05-08 DIAGNOSIS — F17200 Nicotine dependence, unspecified, uncomplicated: Secondary | ICD-10-CM | POA: Diagnosis not present

## 2019-05-08 DIAGNOSIS — G250 Essential tremor: Secondary | ICD-10-CM | POA: Diagnosis not present

## 2019-05-09 NOTE — Progress Notes (Signed)
    Daily Group Progress Note  Program: CD-IOP   Group Time: 1pm-2pm  Participation Level: Active  Behavioral Response: Appropriate  Type of Therapy: Process Group  Topic: Clinician checked in with group members, assessing for SI/HI/psychosis and overall level of functioning including relapse and barriers to recovery. Clinician and group members discussed highlights and struggles since last group related to maintaining sobriety including identified triggers and responses. Clinician and group members processed Daily Reflection and  personal meeting to current work in recovery.    Group Time: 2pm-4pm  Participation Level: Active  Behavioral Response: Appropriate  Type of Therapy: Psycho-education Group  Topic: Clinician presented psychoeducational material on ' Feelings, Thoughts, and Mind Traps: A Guide to [employer]' from [employer] Clinician facilitated discussion with clients on types of distorted thinking styles and how to challenge thoughts. Clinician allowed time for clients to discuss examples of distorted thoughts as well as help other group members create more helpful thoughts and phrases. Clinician wrapped up with Steps for challenging mind traps. Clinician continued with Roadblocks to Healthy Thinking 'Ways of Thinking That Keep You Stuck.' Clinician facilitated conversation related to how distorted thinking effects behaviors. Clinician reminded group members of Cognitive Triangle and the connection between thoughts, feelings, and behaviors. Clinician and group members discussed when examples are genuine (ex: confusion) vs when they are unhelpful and help avoid taking responsibility for own behaviors.  Clinician closed requested a self-care activity to support recovery planned between now and next group.    Summary: Client shares he did not feel any PAWS symptoms though did identify sometimes having a short temper out of nowhere. Client was receptive to confrontation  from group members related to the importance of building a sober support system outside of group and avoiding people previously drank with. Client shared seeing videos of himself intoxicated from friends and using that as motivation to avoid relapse. Client is open to logging into a virtual meeting though states it will likely not keep his attention. Client shows progress toward goals as evidence by maintaining sobriety however continues to need growth in implementing new skills and behaviors to support recovery outside of CDIOP.   Family Program: Family present? No   Name of family member(s): NA  UDS collected: No Results: tobacco  AA/NA attended?: No  Sponsor?: No   Guillermina Shaft A Isais Klipfel, LCSW, LCAS

## 2019-05-12 ENCOUNTER — Encounter (HOSPITAL_COMMUNITY): Payer: 59

## 2019-05-12 ENCOUNTER — Telehealth (HOSPITAL_COMMUNITY): Payer: Self-pay | Admitting: Licensed Clinical Social Worker

## 2019-05-12 NOTE — Progress Notes (Incomplete)
    Daily Group Progress Note  Program: CD-IOP   Group Time: 1pm-2pm  Participation Level: Active  Behavioral Response: Appropriate  Type of Therapy: Process Group  Topic: Clinician checked in with group members, assessing for SI/HI/psychosis and overall level of functioning including relapse and barriers to recovery. Clinician and group members discussed highlights and struggles since last group related to maintaining sobriety including identified triggers and responses. Clinician and group members processed Daily Reflection and  personal meeting to current work in recovery.    Group Time: 2pm-4pm  Participation Level: Active  Behavioral Response: Appropriate  Type of Therapy: Psycho-education Group  Topic: Clinician presented psychoeducational material on ' Feelings, Thoughts, and Mind Traps: A Guide to [employer]' from [employer] Clinician facilitated discussion with clients on types of distorted thinking styles and how to challenge thoughts. Clinician allowed time for clients to discuss examples of distorted thoughts as well as help other group members create more helpful thoughts and phrases. Clinician wrapped up with Steps for challenging mind traps. Clinician continued with Roadblocks to Healthy Thinking 'Ways of Thinking That Keep You Stuck.' Clinician facilitated conversation related to how distorted thinking effects behaviors. Clinician reminded group members of Cognitive Triangle and the connection between thoughts, feelings, and behaviors. Clinician and group members discussed when examples are genuine (ex: confusion) vs when they are unhelpful and help avoid taking responsibility for own behaviors.  Clinician closed requested a self-care activity to support recovery planned between now and next group.    Summary: ***   Family Program: Family present? No   Name of family member(s): ***  UDS collected: Yes Results: {Findings; urine drug screen:60936}  AA/NA  attended?: No. 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  Sponsor?: No   Shon Baton Alexxander Kurt, LCSW, LCAS

## 2019-05-13 NOTE — Progress Notes (Incomplete)
    Daily Group Progress Note  Program: {[facility] AMB BH IOP/CDIOP Program Type:21022744}   Group Time: 1pm-2:30pm  Participation Level: Active  Behavioral Response: Appropriate  Type of Therapy: Psycho-education Group  Topic:  Clinician checked in with group members assessing for SI/HI/psychosis and overall level of functioning including sobriety date and community support meetings attended. Pharmacist facilitated psycho-educational group on medications used to treat addiction as well as mental health symptoms. Clients were provided time to ask questions.    Group Time: ***  Participation Level: Active  Behavioral Response: Appropriate  Type of Therapy: Process Group  Topic: 2:30-4pm Clinician facilitated process group. Clinician and group members processed recent stressors. Clinician facilitated discussion on Letting Go of Emotional Suffering: Mindfulness of Your Current Emotion. Clinician and group members discussed observing and experiencing emotions without judgement or acting on urges.    Summary: ***   Family Program: Family present? {BHH YES OR NO:22294}   Name of family member(s): ***  UDS collected: {BHH YES OR NO:22294} Results: {Findings; urine drug screen:60936}  AA/NA attended?: {BHH YES OR NO:22294}{DAYS OF GNOI:37048}  Sponsor?: {BHH YES OR GQ:91694}   Olegario Messier, LCSW

## 2019-05-14 ENCOUNTER — Other Ambulatory Visit: Payer: Self-pay

## 2019-05-14 ENCOUNTER — Other Ambulatory Visit (HOSPITAL_COMMUNITY): Payer: 59

## 2019-05-14 DIAGNOSIS — F1120 Opioid dependence, uncomplicated: Secondary | ICD-10-CM | POA: Diagnosis not present

## 2019-05-14 DIAGNOSIS — F1020 Alcohol dependence, uncomplicated: Secondary | ICD-10-CM | POA: Diagnosis not present

## 2019-05-15 ENCOUNTER — Other Ambulatory Visit (HOSPITAL_COMMUNITY): Payer: 59

## 2019-05-19 ENCOUNTER — Other Ambulatory Visit: Payer: Self-pay

## 2019-05-19 ENCOUNTER — Ambulatory Visit (HOSPITAL_COMMUNITY): Payer: 59 | Admitting: Licensed Clinical Social Worker

## 2019-05-19 ENCOUNTER — Other Ambulatory Visit (HOSPITAL_COMMUNITY): Payer: 59 | Admitting: Licensed Clinical Social Worker

## 2019-05-19 DIAGNOSIS — G250 Essential tremor: Secondary | ICD-10-CM | POA: Diagnosis not present

## 2019-05-19 DIAGNOSIS — F1020 Alcohol dependence, uncomplicated: Secondary | ICD-10-CM

## 2019-05-19 DIAGNOSIS — F17200 Nicotine dependence, unspecified, uncomplicated: Secondary | ICD-10-CM | POA: Diagnosis not present

## 2019-05-19 DIAGNOSIS — F1221 Cannabis dependence, in remission: Secondary | ICD-10-CM | POA: Diagnosis not present

## 2019-05-19 DIAGNOSIS — F411 Generalized anxiety disorder: Secondary | ICD-10-CM | POA: Diagnosis not present

## 2019-05-19 DIAGNOSIS — R454 Irritability and anger: Secondary | ICD-10-CM | POA: Diagnosis not present

## 2019-05-19 DIAGNOSIS — Z639 Problem related to primary support group, unspecified: Secondary | ICD-10-CM | POA: Diagnosis not present

## 2019-05-19 NOTE — Progress Notes (Signed)
    Daily Group Progress Note  Program: CD-IOP   Group Time: 1pm-2:30pm  Participation Level: Active  Behavioral Response: Appropriate  Type of Therapy: Process Group  Topic: Clinician checked in with group members, assessing for SI/HI/psychosis and overall level of functioning. Clinician inquired about highs and lows of the weekend including barriers to sobriety and coping skills attempted. Clinician and group members read and processed Daily Reflection and how it applies to current engagement in recovery. Clinician provided guided visualization activity.   Group Time: 2:30pm-4pm  Participation Level: Active  Behavioral Response: Appropriate  Type of Therapy: Psycho-education Group  Topic: Clinician and group members reviewed communication and boundaries discussions from previous week. Clinician and group members role played using assertive communication for setting boundaries with friends and family. Clinician presented the topic of Values. Clinician and group members engage in activity to identify core values and discussed how this effects where boundaries are set. Clinician and group members discussed how priorities and boundaries have changed with life circumstances and the importance of being surrounded by people with similar values for support as well as the consequences of being around people who have opposite values, unsupportive of client recovery. Clinician inquired about plans for self-care and to support recovery before next group.   Summary: Client engaged in group discussions. Client endorses believing he does not need the IOP level of care and would be fine with outpatient which would give him more time to work, which is his current priority. Client minimizes the need for relapse prevention stating he does not currently want to drink and believes it will stay that way unless something 'terrible' happens. Client prioritizes working and getting 'things back on track' to  other activities. Client will miss individual session this week due to work obligations.   Family Program: Family present? No   Name of family member(s): n/a  UDS collected: Yes Results: pending  AA/NA attended?: No  Sponsor?: No   [name] A Brian Isidro, LCSW, LCAS

## 2019-05-21 ENCOUNTER — Encounter (HOSPITAL_COMMUNITY): Payer: 59

## 2019-05-22 ENCOUNTER — Other Ambulatory Visit: Payer: Self-pay

## 2019-05-22 ENCOUNTER — Other Ambulatory Visit (HOSPITAL_COMMUNITY): Payer: 59 | Attending: Medical

## 2019-05-22 DIAGNOSIS — F1221 Cannabis dependence, in remission: Secondary | ICD-10-CM | POA: Insufficient documentation

## 2019-05-22 DIAGNOSIS — F1020 Alcohol dependence, uncomplicated: Secondary | ICD-10-CM | POA: Insufficient documentation

## 2019-05-26 ENCOUNTER — Other Ambulatory Visit (HOSPITAL_COMMUNITY): Payer: 59 | Admitting: Licensed Clinical Social Worker

## 2019-05-26 ENCOUNTER — Other Ambulatory Visit: Payer: Self-pay

## 2019-05-26 DIAGNOSIS — Z639 Problem related to primary support group, unspecified: Secondary | ICD-10-CM

## 2019-05-26 DIAGNOSIS — F1020 Alcohol dependence, uncomplicated: Secondary | ICD-10-CM

## 2019-05-26 DIAGNOSIS — F1221 Cannabis dependence, in remission: Secondary | ICD-10-CM
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
## 2019-05-26 NOTE — Progress Notes (Signed)
    Daily Group Progress Note  Program: CD-IOP   Group Time: 1pm-2:30pm  Participation Level: Minimal  Behavioral Response: Passive-Aggressive and Disruptive  Type of Therapy: Psycho-education Group  Topic: Clinician checked in with clients, assessing for SI/HI/psychosis and overall level of functioning. Clinician inquired about successes and barriers to recovery experienced over the weekend.  Group was co-facilitated to include yoga and mindfulness to support recovery.   Group Time: 2:30pm-4pm  Participation Level: Minimal  Behavioral Response: Appropriate  Type of Therapy: Process Group  Topic: Clinician and group members reviewed information on Adult Children of Alcoholics. Clinician and group members processed traits developed and effect on current relationship behaviors. Clinician and group members processed specific recent examples of ACOA traits and previously reviewed distorted thinking styles. Clinician and group members discussed how these dynamics effected their continued use or support of recovery.   Summary: Client presented to group reporting he did not feel well. Client was not running a temperature. Client participated minimally in yoga/mindfulness session. Client met with PA to discuss options for discharging to a lower level of care. Client engaged some in discussion of personal traits and how family dynamics growing up affects current relationships. Client did share some about his parents lack of supervision until his drinking caused legal trouble. Client shows progress toward goals as evidence by achieving and maintaining sobriety. Client has identified sober support system and reports actively attempting to avoid people/places/things likely to trigger relapse. Client will be referred to individual therapist for continued outpatient therapy.   Family Program: Family present? No   Name of family member(s): NA  UDS collected: No   AA/NA attended?: No  Sponsor?: No   Olegario Messier, LCSW

## 2019-05-27 ENCOUNTER — Ambulatory Visit (INDEPENDENT_AMBULATORY_CARE_PROVIDER_SITE_OTHER): Payer: 59 | Admitting: Licensed Clinical Social Worker

## 2019-05-27 DIAGNOSIS — G250 Essential tremor: Secondary | ICD-10-CM

## 2019-05-27 DIAGNOSIS — R454 Irritability and anger: Secondary | ICD-10-CM

## 2019-05-27 DIAGNOSIS — F1221 Cannabis dependence, in remission: Secondary | ICD-10-CM

## 2019-05-27 DIAGNOSIS — F17200 Nicotine dependence, unspecified, uncomplicated: Secondary | ICD-10-CM

## 2019-05-27 DIAGNOSIS — Z639 Problem related to primary support group, unspecified: Secondary | ICD-10-CM

## 2019-05-27 DIAGNOSIS — F1021 Alcohol dependence, in remission: Secondary | ICD-10-CM

## 2019-05-28 ENCOUNTER — Encounter (HOSPITAL_COMMUNITY): Payer: Self-pay | Admitting: Licensed Clinical Social Worker

## 2019-05-29 ENCOUNTER — Encounter: Payer: Self-pay | Admitting: Psychology

## 2019-06-28 ENCOUNTER — Emergency Department (HOSPITAL_COMMUNITY): Payer: PRIVATE HEALTH INSURANCE

## 2019-06-28 ENCOUNTER — Encounter (HOSPITAL_COMMUNITY): Payer: Self-pay | Admitting: Emergency Medicine

## 2019-06-28 ENCOUNTER — Emergency Department (HOSPITAL_COMMUNITY)
Admission: EM | Admit: 2019-06-28 | Discharge: 2019-06-29 | Disposition: A | Discharge status: Home / Self Care | Payer: PRIVATE HEALTH INSURANCE | Attending: Emergency Medicine | Admitting: Emergency Medicine

## 2019-06-28 ENCOUNTER — Other Ambulatory Visit: Payer: Self-pay

## 2019-06-28 DIAGNOSIS — I10 Essential (primary) hypertension: Secondary | ICD-10-CM | POA: Diagnosis not present

## 2019-06-28 DIAGNOSIS — J9801 Acute bronchospasm: Secondary | ICD-10-CM | POA: Diagnosis not present

## 2019-06-28 DIAGNOSIS — Y999 Unspecified external cause status: Secondary | ICD-10-CM | POA: Diagnosis not present

## 2019-06-28 DIAGNOSIS — Y929 Unspecified place or not applicable: Secondary | ICD-10-CM | POA: Diagnosis not present

## 2019-06-28 DIAGNOSIS — F17220 Nicotine dependence, chewing tobacco, uncomplicated: Secondary | ICD-10-CM | POA: Insufficient documentation

## 2019-06-28 DIAGNOSIS — F17210 Nicotine dependence, cigarettes, uncomplicated: Secondary | ICD-10-CM | POA: Diagnosis not present

## 2019-06-28 DIAGNOSIS — R1310 Dysphagia, unspecified: Secondary | ICD-10-CM | POA: Diagnosis present

## 2019-06-28 DIAGNOSIS — Y939 Activity, unspecified: Secondary | ICD-10-CM | POA: Diagnosis not present
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
## 2019-06-29 ENCOUNTER — Emergency Department (HOSPITAL_COMMUNITY): Payer: PRIVATE HEALTH INSURANCE

## 2019-06-29 MED ORDER — ALBUTEROL SULFATE HFA 108 (90 BASE) MCG/ACT IN AERS
2.0000 | INHALATION_SPRAY | Freq: Once | RESPIRATORY_TRACT | Status: AC
  Administered 2019-06-29: 2 via RESPIRATORY_TRACT
  Filled 2019-06-29: qty 6.7

## 2019-06-29 MED ORDER — AEROCHAMBER Z-STAT PLUS/MEDIUM MISC: 1.0000 | Freq: Once | Status: DC
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
## 2019-06-29 NOTE — Progress Notes (Signed)
Patient states he is not sick ; Md ordered inhaler because patient has wheezes.

## 2019-12-24 ENCOUNTER — Telehealth: Payer: Self-pay | Admitting: Family Medicine

## 2019-12-24 NOTE — Telephone Encounter (Signed)
Mom calling in requesting shot record and proof of Tdap for pt. He is starting a new job.

## 2019-12-24 NOTE — Telephone Encounter (Signed)
Shot record printed and pt is aware. Record up front for pickup

## 2020-02-18 DIAGNOSIS — R0989 Other specified symptoms and signs involving the circulatory and respiratory systems: Secondary | ICD-10-CM | POA: Diagnosis not present

## 2020-02-18 DIAGNOSIS — S82202B Unspecified fracture of shaft of left tibia, initial encounter for open fracture type I or II: Secondary | ICD-10-CM | POA: Diagnosis not present

## 2020-02-18 DIAGNOSIS — S3991XA Unspecified injury of abdomen, initial encounter: Secondary | ICD-10-CM | POA: Diagnosis not present

## 2020-02-18 DIAGNOSIS — S82302A Unspecified fracture of lower end of left tibia, initial encounter for closed fracture: Secondary | ICD-10-CM | POA: Diagnosis not present

## 2020-02-18 DIAGNOSIS — R935 Abnormal findings on diagnostic imaging of other abdominal regions, including retroperitoneum: Secondary | ICD-10-CM | POA: Diagnosis not present

## 2020-02-18 DIAGNOSIS — Z20822 Contact with and (suspected) exposure to covid-19: Secondary | ICD-10-CM | POA: Diagnosis not present

## 2020-02-18 DIAGNOSIS — S72302C Unspecified fracture of shaft of left femur, initial encounter for open fracture type IIIA, IIIB, or IIIC: Secondary | ICD-10-CM | POA: Diagnosis not present

## 2020-02-18 DIAGNOSIS — S52022A Displaced fracture of olecranon process without intraarticular extension of left ulna, initial encounter for closed fracture: Secondary | ICD-10-CM | POA: Diagnosis not present

## 2020-02-18 DIAGNOSIS — S72302A Unspecified fracture of shaft of left femur, initial encounter for closed fracture: Secondary | ICD-10-CM | POA: Diagnosis not present

## 2020-02-18 DIAGNOSIS — S42452B Displaced fracture of lateral condyle of left humerus, initial encounter for open fracture: Secondary | ICD-10-CM | POA: Diagnosis not present

## 2020-02-18 DIAGNOSIS — E872 Acidosis: Secondary | ICD-10-CM | POA: Diagnosis not present

## 2020-02-18 DIAGNOSIS — E875 Hyperkalemia: Secondary | ICD-10-CM | POA: Diagnosis not present

## 2020-02-18 DIAGNOSIS — Z6829 Body mass index (BMI) 29.0-29.9, adult: Secondary | ICD-10-CM | POA: Diagnosis not present

## 2020-02-18 DIAGNOSIS — E871 Hypo-osmolality and hyponatremia: Secondary | ICD-10-CM | POA: Diagnosis not present

## 2020-02-18 DIAGNOSIS — I82431 Acute embolism and thrombosis of right popliteal vein: Secondary | ICD-10-CM | POA: Diagnosis not present

## 2020-02-18 DIAGNOSIS — S52122A Displaced fracture of head of left radius, initial encounter for closed fracture: Secondary | ICD-10-CM | POA: Diagnosis not present

## 2020-02-18 DIAGNOSIS — S62639A Displaced fracture of distal phalanx of unspecified finger, initial encounter for closed fracture: Secondary | ICD-10-CM | POA: Diagnosis not present

## 2020-02-18 DIAGNOSIS — R000 Tachycardia, unspecified: Secondary | ICD-10-CM | POA: Diagnosis not present

## 2020-02-18 DIAGNOSIS — J810 Acute pulmonary edema: Secondary | ICD-10-CM | POA: Diagnosis not present

## 2020-02-18 DIAGNOSIS — S91212A Laceration without foreign body of left great toe with damage to nail, initial encounter: Secondary | ICD-10-CM | POA: Diagnosis not present

## 2020-02-18 DIAGNOSIS — F1010 Alcohol abuse, uncomplicated: Secondary | ICD-10-CM | POA: Diagnosis not present

## 2020-02-18 DIAGNOSIS — S42402B Unspecified fracture of lower end of left humerus, initial encounter for open fracture: Secondary | ICD-10-CM | POA: Diagnosis not present

## 2020-02-18 DIAGNOSIS — S52002B Unspecified fracture of upper end of left ulna, initial encounter for open fracture type I or II: Secondary | ICD-10-CM | POA: Diagnosis not present

## 2020-02-18 DIAGNOSIS — S82252B Displaced comminuted fracture of shaft of left tibia, initial encounter for open fracture type I or II: Secondary | ICD-10-CM | POA: Diagnosis not present

## 2020-02-18 DIAGNOSIS — S92352A Displaced fracture of fifth metatarsal bone, left foot, initial encounter for closed fracture: Secondary | ICD-10-CM | POA: Diagnosis not present

## 2020-02-18 DIAGNOSIS — S92535A Nondisplaced fracture of distal phalanx of left lesser toe(s), initial encounter for closed fracture: Secondary | ICD-10-CM | POA: Diagnosis not present

## 2020-02-18 DIAGNOSIS — S72352C Displaced comminuted fracture of shaft of left femur, initial encounter for open fracture type IIIA, IIIB, or IIIC: Secondary | ICD-10-CM | POA: Diagnosis not present

## 2020-02-18 DIAGNOSIS — S52252A Displaced comminuted fracture of shaft of ulna, left arm, initial encounter for closed fracture: Secondary | ICD-10-CM | POA: Diagnosis not present

## 2020-02-18 DIAGNOSIS — M21922 Unspecified acquired deformity of left upper arm: Secondary | ICD-10-CM | POA: Diagnosis not present

## 2020-02-18 DIAGNOSIS — S42302A Unspecified fracture of shaft of humerus, left arm, initial encounter for closed fracture: Secondary | ICD-10-CM | POA: Diagnosis not present

## 2020-02-18 DIAGNOSIS — M21852 Other specified acquired deformities of left thigh: Secondary | ICD-10-CM | POA: Diagnosis not present

## 2020-02-18 DIAGNOSIS — S82142A Displaced bicondylar fracture of left tibia, initial encounter for closed fracture: Secondary | ICD-10-CM | POA: Diagnosis not present

## 2020-02-18 DIAGNOSIS — S299XXA Unspecified injury of thorax, initial encounter: Secondary | ICD-10-CM | POA: Diagnosis not present

## 2020-02-18 DIAGNOSIS — S72002B Fracture of unspecified part of neck of left femur, initial encounter for open fracture type I or II: Secondary | ICD-10-CM | POA: Diagnosis not present

## 2020-02-18 DIAGNOSIS — I96 Gangrene, not elsewhere classified: Secondary | ICD-10-CM | POA: Diagnosis not present

## 2020-02-18 DIAGNOSIS — S82453A Displaced comminuted fracture of shaft of unspecified fibula, initial encounter for closed fracture: Secondary | ICD-10-CM | POA: Diagnosis not present

## 2020-02-18 DIAGNOSIS — S92332A Displaced fracture of third metatarsal bone, left foot, initial encounter for closed fracture: Secondary | ICD-10-CM | POA: Diagnosis not present

## 2020-02-18 DIAGNOSIS — R936 Abnormal findings on diagnostic imaging of limbs: Secondary | ICD-10-CM | POA: Diagnosis not present

## 2020-02-18 DIAGNOSIS — S72302B Unspecified fracture of shaft of left femur, initial encounter for open fracture type I or II: Secondary | ICD-10-CM | POA: Diagnosis not present

## 2020-02-18 DIAGNOSIS — N170 Acute kidney failure with tubular necrosis: Secondary | ICD-10-CM | POA: Diagnosis not present

## 2020-02-18 DIAGNOSIS — S72352A Displaced comminuted fracture of shaft of left femur, initial encounter for closed fracture: Secondary | ICD-10-CM | POA: Diagnosis not present

## 2020-02-18 DIAGNOSIS — S37032A Laceration of left kidney, unspecified degree, initial encounter: Secondary | ICD-10-CM | POA: Diagnosis not present

## 2020-02-18 DIAGNOSIS — S199XXA Unspecified injury of neck, initial encounter: Secondary | ICD-10-CM | POA: Diagnosis not present

## 2020-02-18 DIAGNOSIS — S92512A Displaced fracture of proximal phalanx of left lesser toe(s), initial encounter for closed fracture: Secondary | ICD-10-CM | POA: Diagnosis not present

## 2020-02-18 DIAGNOSIS — J811 Chronic pulmonary edema: Secondary | ICD-10-CM | POA: Diagnosis not present

## 2020-02-18 DIAGNOSIS — M21952 Unspecified acquired deformity of left thigh: Secondary | ICD-10-CM | POA: Diagnosis not present

## 2020-02-18 DIAGNOSIS — S82402A Unspecified fracture of shaft of left fibula, initial encounter for closed fracture: Secondary | ICD-10-CM | POA: Diagnosis not present

## 2020-02-18 DIAGNOSIS — S0083XA Contusion of other part of head, initial encounter: Secondary | ICD-10-CM | POA: Diagnosis not present

## 2020-02-18 DIAGNOSIS — M25522 Pain in left elbow: Secondary | ICD-10-CM | POA: Diagnosis not present

## 2020-02-18 DIAGNOSIS — S85292A Other specified injury of peroneal artery, left leg, initial encounter: Secondary | ICD-10-CM | POA: Diagnosis not present

## 2020-02-18 DIAGNOSIS — S82202A Unspecified fracture of shaft of left tibia, initial encounter for closed fracture: Secondary | ICD-10-CM | POA: Diagnosis not present

## 2020-02-18 DIAGNOSIS — R2689 Other abnormalities of gait and mobility: Secondary | ICD-10-CM | POA: Diagnosis not present

## 2020-02-18 DIAGNOSIS — N179 Acute kidney failure, unspecified: Secondary | ICD-10-CM | POA: Diagnosis not present

## 2020-02-18 DIAGNOSIS — S37002A Unspecified injury of left kidney, initial encounter: Secondary | ICD-10-CM | POA: Diagnosis not present

## 2020-02-18 DIAGNOSIS — S72352B Displaced comminuted fracture of shaft of left femur, initial encounter for open fracture type I or II: Secondary | ICD-10-CM | POA: Diagnosis not present

## 2020-02-18 DIAGNOSIS — S92532A Displaced fracture of distal phalanx of left lesser toe(s), initial encounter for closed fracture: Secondary | ICD-10-CM | POA: Diagnosis not present

## 2020-02-18 DIAGNOSIS — M799 Soft tissue disorder, unspecified: Secondary | ICD-10-CM | POA: Diagnosis not present

## 2020-02-18 DIAGNOSIS — S022XXA Fracture of nasal bones, initial encounter for closed fracture: Secondary | ICD-10-CM | POA: Diagnosis not present

## 2020-02-18 DIAGNOSIS — R918 Other nonspecific abnormal finding of lung field: Secondary | ICD-10-CM | POA: Diagnosis not present

## 2020-02-18 DIAGNOSIS — S3993XA Unspecified injury of pelvis, initial encounter: Secondary | ICD-10-CM | POA: Diagnosis not present

## 2020-02-18 DIAGNOSIS — R0902 Hypoxemia: Secondary | ICD-10-CM | POA: Diagnosis not present

## 2020-02-18 DIAGNOSIS — S92322A Displaced fracture of second metatarsal bone, left foot, initial encounter for closed fracture: Secondary | ICD-10-CM | POA: Diagnosis not present

## 2020-02-18 DIAGNOSIS — S52032B Displaced fracture of olecranon process with intraarticular extension of left ulna, initial encounter for open fracture type I or II: Secondary | ICD-10-CM | POA: Diagnosis not present

## 2020-02-18 DIAGNOSIS — S6992XA Unspecified injury of left wrist, hand and finger(s), initial encounter: Secondary | ICD-10-CM | POA: Diagnosis not present

## 2020-02-18 DIAGNOSIS — S3992XA Unspecified injury of lower back, initial encounter: Secondary | ICD-10-CM | POA: Diagnosis not present

## 2020-02-22 DIAGNOSIS — S50311A Abrasion of right elbow, initial encounter: Secondary | ICD-10-CM | POA: Diagnosis not present

## 2020-02-22 DIAGNOSIS — S93325A Dislocation of tarsometatarsal joint of left foot, initial encounter: Secondary | ICD-10-CM | POA: Diagnosis not present

## 2020-02-22 DIAGNOSIS — Z4682 Encounter for fitting and adjustment of non-vascular catheter: Secondary | ICD-10-CM | POA: Diagnosis not present

## 2020-02-22 DIAGNOSIS — S52001A Unspecified fracture of upper end of right ulna, initial encounter for closed fracture: Secondary | ICD-10-CM | POA: Diagnosis not present

## 2020-02-22 DIAGNOSIS — S37092A Other injury of left kidney, initial encounter: Secondary | ICD-10-CM | POA: Diagnosis not present

## 2020-02-22 DIAGNOSIS — S52022B Displaced fracture of olecranon process without intraarticular extension of left ulna, initial encounter for open fracture type I or II: Secondary | ICD-10-CM | POA: Diagnosis not present

## 2020-02-22 DIAGNOSIS — D62 Acute posthemorrhagic anemia: Secondary | ICD-10-CM | POA: Diagnosis not present

## 2020-02-22 DIAGNOSIS — S82252B Displaced comminuted fracture of shaft of left tibia, initial encounter for open fracture type I or II: Secondary | ICD-10-CM | POA: Diagnosis not present

## 2020-02-22 DIAGNOSIS — S82202A Unspecified fracture of shaft of left tibia, initial encounter for closed fracture: Secondary | ICD-10-CM | POA: Diagnosis not present

## 2020-02-22 DIAGNOSIS — S52252A Displaced comminuted fracture of shaft of ulna, left arm, initial encounter for closed fracture: Secondary | ICD-10-CM | POA: Diagnosis not present

## 2020-02-22 DIAGNOSIS — M79605 Pain in left leg: Secondary | ICD-10-CM | POA: Diagnosis not present

## 2020-02-22 DIAGNOSIS — S85292A Other specified injury of peroneal artery, left leg, initial encounter: Secondary | ICD-10-CM | POA: Diagnosis not present

## 2020-02-22 DIAGNOSIS — S52032B Displaced fracture of olecranon process with intraarticular extension of left ulna, initial encounter for open fracture type I or II: Secondary | ICD-10-CM | POA: Diagnosis not present

## 2020-02-22 DIAGNOSIS — S37032A Laceration of left kidney, unspecified degree, initial encounter: Secondary | ICD-10-CM | POA: Diagnosis not present

## 2020-02-22 DIAGNOSIS — M79672 Pain in left foot: Secondary | ICD-10-CM | POA: Diagnosis not present

## 2020-02-22 DIAGNOSIS — I96 Gangrene, not elsewhere classified: Secondary | ICD-10-CM | POA: Diagnosis not present

## 2020-02-22 DIAGNOSIS — S90122A Contusion of left lesser toe(s) without damage to nail, initial encounter: Secondary | ICD-10-CM | POA: Diagnosis not present

## 2020-02-22 DIAGNOSIS — S72352C Displaced comminuted fracture of shaft of left femur, initial encounter for open fracture type IIIA, IIIB, or IIIC: Secondary | ICD-10-CM | POA: Diagnosis not present

## 2020-02-22 DIAGNOSIS — J811 Chronic pulmonary edema: Secondary | ICD-10-CM | POA: Diagnosis not present

## 2020-02-22 DIAGNOSIS — Z9981 Dependence on supplemental oxygen: Secondary | ICD-10-CM | POA: Diagnosis not present

## 2020-02-22 DIAGNOSIS — S72352A Displaced comminuted fracture of shaft of left femur, initial encounter for closed fracture: Secondary | ICD-10-CM | POA: Diagnosis not present

## 2020-02-22 DIAGNOSIS — I82431 Acute embolism and thrombosis of right popliteal vein: Secondary | ICD-10-CM | POA: Diagnosis not present

## 2020-02-22 DIAGNOSIS — D649 Anemia, unspecified: Secondary | ICD-10-CM | POA: Diagnosis not present

## 2020-02-22 DIAGNOSIS — R600 Localized edema: Secondary | ICD-10-CM | POA: Diagnosis not present

## 2020-02-22 DIAGNOSIS — N179 Acute kidney failure, unspecified: Secondary | ICD-10-CM | POA: Diagnosis not present

## 2020-02-22 DIAGNOSIS — I998 Other disorder of circulatory system: Secondary | ICD-10-CM | POA: Diagnosis not present

## 2020-02-22 DIAGNOSIS — E871 Hypo-osmolality and hyponatremia: Secondary | ICD-10-CM | POA: Diagnosis not present

## 2020-02-22 DIAGNOSIS — S022XXA Fracture of nasal bones, initial encounter for closed fracture: Secondary | ICD-10-CM | POA: Diagnosis not present

## 2020-02-22 DIAGNOSIS — I824Y1 Acute embolism and thrombosis of unspecified deep veins of right proximal lower extremity: Secondary | ICD-10-CM | POA: Diagnosis not present

## 2020-02-22 DIAGNOSIS — E876 Hypokalemia: Secondary | ICD-10-CM | POA: Diagnosis not present

## 2020-02-22 DIAGNOSIS — N151 Renal and perinephric abscess: Secondary | ICD-10-CM | POA: Diagnosis not present

## 2020-02-22 DIAGNOSIS — N170 Acute kidney failure with tubular necrosis: Secondary | ICD-10-CM | POA: Diagnosis not present

## 2020-02-22 DIAGNOSIS — I824Z1 Acute embolism and thrombosis of unspecified deep veins of right distal lower extremity: Secondary | ICD-10-CM | POA: Diagnosis not present

## 2020-02-22 DIAGNOSIS — S37012A Minor contusion of left kidney, initial encounter: Secondary | ICD-10-CM | POA: Diagnosis not present

## 2020-02-22 DIAGNOSIS — R918 Other nonspecific abnormal finding of lung field: Secondary | ICD-10-CM | POA: Diagnosis not present

## 2020-02-22 DIAGNOSIS — S42452B Displaced fracture of lateral condyle of left humerus, initial encounter for open fracture: Secondary | ICD-10-CM | POA: Diagnosis not present

## 2020-02-22 DIAGNOSIS — Z9911 Dependence on respirator [ventilator] status: Secondary | ICD-10-CM | POA: Diagnosis not present

## 2020-02-22 DIAGNOSIS — S82402A Unspecified fracture of shaft of left fibula, initial encounter for closed fracture: Secondary | ICD-10-CM | POA: Diagnosis not present

## 2020-02-22 DIAGNOSIS — J810 Acute pulmonary edema: Secondary | ICD-10-CM | POA: Diagnosis not present

## 2020-02-22 DIAGNOSIS — S70211A Abrasion, right hip, initial encounter: Secondary | ICD-10-CM | POA: Diagnosis not present

## 2020-03-05 DIAGNOSIS — S82252B Displaced comminuted fracture of shaft of left tibia, initial encounter for open fracture type I or II: Secondary | ICD-10-CM | POA: Diagnosis not present

## 2020-03-05 DIAGNOSIS — S37032A Laceration of left kidney, unspecified degree, initial encounter: Secondary | ICD-10-CM | POA: Diagnosis not present

## 2020-03-05 DIAGNOSIS — S022XXA Fracture of nasal bones, initial encounter for closed fracture: Secondary | ICD-10-CM | POA: Diagnosis not present

## 2020-03-05 DIAGNOSIS — I82451 Acute embolism and thrombosis of right peroneal vein: Secondary | ICD-10-CM | POA: Diagnosis not present

## 2020-03-05 DIAGNOSIS — S52032B Displaced fracture of olecranon process with intraarticular extension of left ulna, initial encounter for open fracture type I or II: Secondary | ICD-10-CM | POA: Diagnosis not present

## 2020-03-05 DIAGNOSIS — E871 Hypo-osmolality and hyponatremia: Secondary | ICD-10-CM | POA: Diagnosis not present

## 2020-03-05 DIAGNOSIS — N170 Acute kidney failure with tubular necrosis: Secondary | ICD-10-CM | POA: Diagnosis not present

## 2020-03-05 DIAGNOSIS — S37052A Moderate laceration of left kidney, initial encounter: Secondary | ICD-10-CM | POA: Diagnosis not present

## 2020-03-05 DIAGNOSIS — J811 Chronic pulmonary edema: Secondary | ICD-10-CM | POA: Diagnosis not present

## 2020-03-05 DIAGNOSIS — I82431 Acute embolism and thrombosis of right popliteal vein: Secondary | ICD-10-CM | POA: Diagnosis not present

## 2020-03-05 DIAGNOSIS — I82441 Acute embolism and thrombosis of right tibial vein: Secondary | ICD-10-CM | POA: Diagnosis not present

## 2020-03-05 DIAGNOSIS — N179 Acute kidney failure, unspecified: Secondary | ICD-10-CM | POA: Diagnosis not present

## 2020-03-05 DIAGNOSIS — S42452B Displaced fracture of lateral condyle of left humerus, initial encounter for open fracture: Secondary | ICD-10-CM | POA: Diagnosis not present

## 2020-03-05 DIAGNOSIS — S85292A Other specified injury of peroneal artery, left leg, initial encounter: Secondary | ICD-10-CM | POA: Diagnosis not present

## 2020-03-05 DIAGNOSIS — S72352C Displaced comminuted fracture of shaft of left femur, initial encounter for open fracture type IIIA, IIIB, or IIIC: Secondary | ICD-10-CM | POA: Diagnosis not present

## 2020-03-05 DIAGNOSIS — I82442 Acute embolism and thrombosis of left tibial vein: Secondary | ICD-10-CM | POA: Diagnosis not present

## 2020-03-05 DIAGNOSIS — I96 Gangrene, not elsewhere classified: Secondary | ICD-10-CM | POA: Diagnosis not present

## 2020-03-09 DIAGNOSIS — R2689 Other abnormalities of gait and mobility: Secondary | ICD-10-CM | POA: Diagnosis not present

## 2020-03-10 ENCOUNTER — Telehealth: Payer: Self-pay | Admitting: Family Medicine

## 2020-03-10 NOTE — Telephone Encounter (Signed)
May give an appointment for open slot for next week, if no open slot may be given an 11 AM appointment on a weekday and essentially that would be on 1130 work in but would give time for the nurses to work him up thank you
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
## 2020-03-11 DIAGNOSIS — R339 Retention of urine, unspecified: Secondary | ICD-10-CM | POA: Diagnosis not present

## 2020-03-11 DIAGNOSIS — Z6829 Body mass index (BMI) 29.0-29.9, adult: Secondary | ICD-10-CM | POA: Diagnosis not present

## 2020-03-11 DIAGNOSIS — S37009S Unspecified injury of unspecified kidney, sequela: Secondary | ICD-10-CM | POA: Diagnosis not present

## 2020-03-11 NOTE — Telephone Encounter (Signed)
Schedule for 7/29 at 11 first not was error

## 2020-03-11 NOTE — Telephone Encounter (Signed)
Cancelled for 7/29 at 11

## 2020-03-18 ENCOUNTER — Encounter: Payer: Self-pay | Admitting: Family Medicine

## 2020-03-18 ENCOUNTER — Ambulatory Visit (INDEPENDENT_AMBULATORY_CARE_PROVIDER_SITE_OTHER): Payer: 59 | Admitting: Family Medicine

## 2020-03-18 ENCOUNTER — Other Ambulatory Visit: Payer: Self-pay

## 2020-03-18 VITALS — BP 102/68 | Temp 97.0°F | Ht 72.0 in | Wt 200.0 lb

## 2020-03-18 DIAGNOSIS — M79672 Pain in left foot: Secondary | ICD-10-CM | POA: Diagnosis not present

## 2020-03-18 DIAGNOSIS — T07XXXA Unspecified multiple injuries, initial encounter: Secondary | ICD-10-CM

## 2020-03-18 DIAGNOSIS — Z5189 Encounter for other specified aftercare: Secondary | ICD-10-CM | POA: Diagnosis not present

## 2020-03-18 DIAGNOSIS — M79602 Pain in left arm: Secondary | ICD-10-CM | POA: Diagnosis not present

## 2020-03-18 DIAGNOSIS — M79605 Pain in left leg: Secondary | ICD-10-CM

## 2020-03-18 MED ORDER — MUPIROCIN 2 % EX OINT: TOPICAL_OINTMENT | CUTANEOUS | 2 refills | Status: DC
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
## 2020-03-23 ENCOUNTER — Other Ambulatory Visit: Payer: Self-pay

## 2020-03-23 ENCOUNTER — Encounter: Payer: Self-pay | Admitting: Family Medicine

## 2020-03-23 ENCOUNTER — Ambulatory Visit (INDEPENDENT_AMBULATORY_CARE_PROVIDER_SITE_OTHER): Payer: 59 | Admitting: Family Medicine

## 2020-03-23 VITALS — BP 122/76 | HR 81 | Temp 97.9°F | Ht 72.0 in | Wt 200.0 lb

## 2020-03-23 DIAGNOSIS — I96 Gangrene, not elsewhere classified: Secondary | ICD-10-CM | POA: Diagnosis not present

## 2020-03-23 MED ORDER — MUPIROCIN 2 % EX OINT: TOPICAL_OINTMENT | CUTANEOUS | 5 refills | Status: DC
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
## 2020-03-26 NOTE — Progress Notes (Signed)
Contacted [facility] ortho and provider receptionist with name and cell number for Dr.Scott. Sticky note placed on provider door.

## 2020-03-27 ENCOUNTER — Telehealth: Payer: Self-pay | Admitting: Family Medicine

## 2020-03-27 NOTE — Telephone Encounter (Signed)
Nurses Please let mom know that I did connect with orthopedics. They will be seeing him in the near future We have also placed a consultation for vascular surgery but reviewing over the notes from the hospital it does appear that he has had extensive damage of the vessels in the lower leg and it is unlikely that vascular surgery will be able to necessarily improve this issue.  Orthopedics is aware that we have also made consultation request for vascular surgery. If we can be of more help let us know.  We are hoping for the best.

## 2020-03-29 NOTE — Telephone Encounter (Signed)
Pt mom not on DPR. Pt contacted and verbalized understanding. Pt states at this time is he is doing ok.

## 2020-03-30 DIAGNOSIS — S93325S Dislocation of tarsometatarsal joint of left foot, sequela: Secondary | ICD-10-CM | POA: Diagnosis not present

## 2020-03-30 DIAGNOSIS — S52002A Unspecified fracture of upper end of left ulna, initial encounter for closed fracture: Secondary | ICD-10-CM | POA: Diagnosis not present

## 2020-03-30 DIAGNOSIS — S93325D Dislocation of tarsometatarsal joint of left foot, subsequent encounter: Secondary | ICD-10-CM | POA: Diagnosis not present

## 2020-03-30 DIAGNOSIS — S52002S Unspecified fracture of upper end of left ulna, sequela: Secondary | ICD-10-CM | POA: Diagnosis not present

## 2020-03-30 DIAGNOSIS — S42452D Displaced fracture of lateral condyle of left humerus, subsequent encounter for fracture with routine healing: Secondary | ICD-10-CM | POA: Diagnosis not present

## 2020-03-30 DIAGNOSIS — S52022E Displaced fracture of olecranon process without intraarticular extension of left ulna, subsequent encounter for open fracture type I or II with routine healing: Secondary | ICD-10-CM | POA: Diagnosis not present

## 2020-03-31 ENCOUNTER — Telehealth: Payer: Self-pay | Admitting: Family Medicine

## 2020-03-31 NOTE — Telephone Encounter (Signed)
Contacted mom. Mom states that originally the Trauma Team had told mom that pt may or may not lose a toe. May wake up one morning and fine a toe in the bed.   Mom would like to discuss ortho visit with provider. Mom would like second opinion. Bedside manner is not the greatest per mom. They do not explain much. Mom would like second opinion either at [facility] or [place]. Wondering if provider had recommendations or could help get them in faster.  Ortho recommend pt to get in shower with warm water, pat leg dry and let leg stay out with no bandages for a while. Ortho did take cast off of arm stitches out. Mom also received a paper for PT and took that to PT office in [place]. Pt leg swelling is going down but swelling in foot is not. Pt has not seen a vascular surgeon yet (referral has been placed).  Pt has appt on 04/06/20 follow up but mom would like to speak with provider beforehand and keep 04/06/20 appt. Mom states phone call from provider is fine. Please advise. Thank you

## 2020-03-31 NOTE — Telephone Encounter (Signed)
[name] is wanting to discuss ortho visit in [facility] yesterday and she is wanting your opinion on what waas discuss at this appointment. Will this be a phone visit or office visit to discuss this ? When and where to put this? Please advise

## 2020-04-01 ENCOUNTER — Telehealth (INDEPENDENT_AMBULATORY_CARE_PROVIDER_SITE_OTHER): Payer: 59 | Admitting: Family Medicine

## 2020-04-01 ENCOUNTER — Encounter: Payer: Self-pay | Admitting: Family Medicine

## 2020-04-01 ENCOUNTER — Other Ambulatory Visit: Payer: Self-pay

## 2020-04-01 ENCOUNTER — Other Ambulatory Visit (HOSPITAL_COMMUNITY): Payer: Self-pay | Admitting: Orthopedic Surgery

## 2020-04-01 ENCOUNTER — Telehealth: Payer: Self-pay | Admitting: Family Medicine

## 2020-04-01 DIAGNOSIS — I96 Gangrene, not elsewhere classified: Secondary | ICD-10-CM

## 2020-04-01 DIAGNOSIS — M79672 Pain in left foot: Secondary | ICD-10-CM | POA: Diagnosis not present

## 2020-04-01 DIAGNOSIS — T07XXXA Unspecified multiple injuries, initial encounter: Secondary | ICD-10-CM

## 2020-04-01 NOTE — Progress Notes (Signed)
   Subjective:    Patient ID: [name], male    DOB: 02-Aug-1991, 29 y.o.   MRN: [id number]  HPI   Telephone conversation with his mother She is on DPR  Virtual Visit via Video Note  I connected with [name] on 04/01/20 at 12:10 PM EDT by a video enabled telemedicine application and verified that I am speaking with the correct person using two identifiers.  Location: Patient: home Provider: office   I discussed the limitations of evaluation and management by telemedicine and the availability of in person appointments. The patient expressed understanding and agreed to proceed.  History of Present Illness:    Observations/Objective:   Assessment and Plan:   Follow Up Instructions:    I discussed the assessment and treatment plan with the patient. The patient was provided an opportunity to ask questions and all were answered. The patient agreed with the plan and demonstrated an understanding of the instructions.   The patient was advised to call back or seek an in-person evaluation if the symptoms worsen or if the condition fails to improve as anticipated.  I provided 20 minutes of non-face-to-face time during this encounter.  To include documentation in chart review    Review of Systems     Objective:   Physical Exam  At [facility] his orthopedic surgeon is Dr. [name]      Assessment & Plan:  Severe orthopedic injury Long-term prognosis guarded More than likely will undergo partial amputation of the foot More than likely will be pacing at least 6 months of rehab I have encouraged mother and the patient to avoid excessive narcotic to avoid the risk of addiction Also we will be getting appointment for her with vascular surgery mainly because they are concerned that there needs to be something more done to help save his foot.  Unfortunately it is not likely that vascular surgery will be able to help him because of the damage done to the vessels.   Orthopedics at [facility] is help setting this up but we had also sent in a referral.  Patient has a follow-up visit next week according to the mother the patient has been very stressed and emotional we will address that to see if there is any underlying depression issues going on

## 2020-04-01 NOTE — Telephone Encounter (Signed)
Mom calling back checking on message from yesterday.  Mom tearful, said she is about to loose her mind.  She said the PT order was written wrong yesterday (by ortho) and he can't get PT until that is corrected.  She was unable to get his medications at [facility] yesterday because [facility] insurance won't cover unless from [facility] out pt pharmacy.  She is just under a lot of stress trying to help her son. Just wanted to make sure Dr. Lorin Picket had gotten her message.

## 2020-04-01 NOTE — Telephone Encounter (Signed)
Please advise. Thank you

## 2020-04-02 MED FILL — GABAPENTIN 300 MG CAPSULE: 300 | 30 days supply | Qty: 90 | Fill #0

## 2020-04-02 MED FILL — oxyCODONE HCL 5 MG TABS: 5 | 30 days supply | Qty: 60 | Fill #0

## 2020-04-02 NOTE — Telephone Encounter (Signed)
I did have a good conversation with the mother regarding this issue

## 2020-04-06 ENCOUNTER — Ambulatory Visit (HOSPITAL_COMMUNITY): Payer: 59 | Admitting: Physical Therapy

## 2020-04-06 ENCOUNTER — Encounter: Payer: Self-pay | Admitting: Family Medicine

## 2020-04-06 ENCOUNTER — Ambulatory Visit (HOSPITAL_COMMUNITY): Payer: 59 | Attending: Orthopedic Surgery | Admitting: Specialist

## 2020-04-06 ENCOUNTER — Other Ambulatory Visit: Payer: Self-pay

## 2020-04-06 ENCOUNTER — Ambulatory Visit (INDEPENDENT_AMBULATORY_CARE_PROVIDER_SITE_OTHER): Payer: 59 | Admitting: Family Medicine

## 2020-04-06 ENCOUNTER — Encounter (HOSPITAL_COMMUNITY): Payer: Self-pay | Admitting: Specialist

## 2020-04-06 VITALS — BP 122/84 | Temp 97.1°F

## 2020-04-06 DIAGNOSIS — I824Y2 Acute embolism and thrombosis of unspecified deep veins of left proximal lower extremity: Secondary | ICD-10-CM | POA: Insufficient documentation

## 2020-04-06 DIAGNOSIS — I96 Gangrene, not elsewhere classified: Secondary | ICD-10-CM | POA: Diagnosis not present

## 2020-04-06 DIAGNOSIS — M79632 Pain in left forearm: Secondary | ICD-10-CM | POA: Insufficient documentation

## 2020-04-06 DIAGNOSIS — R262 Difficulty in walking, not elsewhere classified: Secondary | ICD-10-CM | POA: Insufficient documentation

## 2020-04-06 DIAGNOSIS — M25522 Pain in left elbow: Secondary | ICD-10-CM | POA: Insufficient documentation

## 2020-04-06 DIAGNOSIS — M6281 Muscle weakness (generalized): Secondary | ICD-10-CM

## 2020-04-06 DIAGNOSIS — R29898 Other symptoms and signs involving the musculoskeletal system: Secondary | ICD-10-CM | POA: Diagnosis not present

## 2020-04-06 DIAGNOSIS — M79662 Pain in left lower leg: Secondary | ICD-10-CM | POA: Insufficient documentation

## 2020-04-06 DIAGNOSIS — M25632 Stiffness of left wrist, not elsewhere classified: Secondary | ICD-10-CM | POA: Insufficient documentation

## 2020-04-06 DIAGNOSIS — M25622 Stiffness of left elbow, not elsewhere classified: Secondary | ICD-10-CM | POA: Diagnosis not present

## 2020-04-06 MED ORDER — APIXABAN 5 MG PO TABS: ORAL_TABLET | ORAL | 2 refills | Status: DC

## 2020-04-06 MED ORDER — MUPIROCIN 2 % EX OINT: TOPICAL_OINTMENT | CUTANEOUS | 5 refills | Status: DC

## 2020-04-06 NOTE — Patient Instructions (Signed)
AROM: Wrist Extension   With right palm down, bend wrist up. Repeat ____ times per set. Do ____ sets per session. Do ____ sessions per day.  Copyright  VHI. All rights reserved.   AROM: Wrist Flexion   With right palm up, bend wrist up. Repeat ____ times per set. Do ____ sets per session. Do ____ sessions per day.  Copyright  VHI. All rights reserved.   AROM: Forearm Pronation / Supination   With right arm in handshake position, slowly rotate palm down until stretch is felt. Relax. Then rotate palm up until stretch is felt. Repeat ____ times per set. Do ____ sets per session. Do ____ sessions per day.  Copyright  VHI. All rights reserved.   AROM: Elbow Flexion / Extension   With left hand palm up, gently bend elbow as far as possible. Then straighten arm as far as possible. Repeat ____ times per set. Do ____ sets per session. Do ____ sessions per day.  Copyright  VHI. All rights reserved.  Home Exercises Program Theraputty Exercises  Do the following exercises 2 times a day using your affected hand.  1. Roll putty into a ball.  2. Make into a pancake.  3. Roll putty into a roll.  4. Pinch along log with first finger and thumb.   5. Make into a ball.  6. Roll it back into a log.   7. Pinch using thumb and side of first finger.  8. Roll into a ball, then flatten into a pancake.  9. Using your fingers, make putty into a mountain.

## 2020-04-06 NOTE — Progress Notes (Signed)
   Subjective:    Patient ID: [name], male    DOB: 02/08/1991, 29 y.o.   MRN: [id number]  HPI Pt here for follow up. Mom states still no appt from vascular surgeon. Pt has began PT.  Patient has retained stitches in his left elbow. Also physical therapy started today they did wound care Patient relates he is able to put some weight on his heel Can feel well underneath his foot Still has gangrenous first 3 toes as well as the top of his foot Patient denies being depressed anxious nervous Review of Systems Please see above denies fever chills sweats denies severe drainage    Objective:   Physical Exam Lungs clear respiratory rate normal heart regular no murmurs gangrenous first 3 toes are noted   Patient unable to work currently.  Will be facing partial amputation later this year    Assessment & Plan:  Patient had a couple small retained stitches in the right arm one was removed without difficulty the other he will discuss it with orthopedics  If he has any additional retained stitches they should extrude themselves on their own  Gangrenous foot more than likely will lose his first 3 toes follow-up with orthopedics in September Family still waiting on vascular surgery consult we will put a call into orthopedics to see if they will be able to help set this up or if we need to be able to set this in up  I did discuss with him regarding his mental health he is trying to hang in there the best he can he denies being depressed does not want to be on medications we did discuss avoiding alcohol  Follow-up in 6 to 8 weeks  DVT-continue Eliquis for the next 3 months then reevaluate based upon orthopedic issues Based upon the extensive DVT patient needs minimum of 4 months if not 6 months when to stop Eliquis will be based upon how well his leg is functioning and how well he is getting over any surgeries.  More than likely will need to have Doppler studies D-dimer before stopping.   Significant chance he will need to be on Eliquis for 6 months.  Very important to stay away from all anti-inflammatories and aspirin, warning signs regarding bleeding was discussed with patient
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
## 2020-04-06 NOTE — Addendum Note (Signed)
Addended by: Lilyan Punt A on: 04/06/2020 07:43 PM   Modules accepted: Level of Service

## 2020-04-07 ENCOUNTER — Encounter (HOSPITAL_COMMUNITY): Payer: Self-pay | Admitting: Physical Therapy

## 2020-04-07 ENCOUNTER — Ambulatory Visit (HOSPITAL_COMMUNITY): Payer: 59 | Admitting: Physical Therapy

## 2020-04-07 DIAGNOSIS — M79662 Pain in left lower leg: Secondary | ICD-10-CM | POA: Diagnosis not present

## 2020-04-07 DIAGNOSIS — R29898 Other symptoms and signs involving the musculoskeletal system: Secondary | ICD-10-CM | POA: Diagnosis not present

## 2020-04-07 DIAGNOSIS — Z029 Encounter for administrative examinations, unspecified: Secondary | ICD-10-CM

## 2020-04-07 DIAGNOSIS — M25622 Stiffness of left elbow, not elsewhere classified: Secondary | ICD-10-CM | POA: Diagnosis not present

## 2020-04-07 DIAGNOSIS — R262 Difficulty in walking, not elsewhere classified: Secondary | ICD-10-CM

## 2020-04-07 DIAGNOSIS — M6281 Muscle weakness (generalized): Secondary | ICD-10-CM | POA: Diagnosis not present

## 2020-04-07 DIAGNOSIS — M79632 Pain in left forearm: Secondary | ICD-10-CM | POA: Diagnosis not present

## 2020-04-07 DIAGNOSIS — M25522 Pain in left elbow: Secondary | ICD-10-CM | POA: Diagnosis not present

## 2020-04-07 DIAGNOSIS — M25632 Stiffness of left wrist, not elsewhere classified: Secondary | ICD-10-CM | POA: Diagnosis not present

## 2020-04-07 MED FILL — ELIQUIS 5 MG TABLET: 5 | 30 days supply | Qty: 60 | Fill #0
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
## 2020-04-08 NOTE — Telephone Encounter (Signed)
Obviously good news thank you

## 2020-04-08 NOTE — Telephone Encounter (Signed)
Called pt and he states he has an appt next Wednesday on the 25th with [facility] vascular. States he does not need anything at this time.
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
## 2020-04-09 ENCOUNTER — Ambulatory Visit (HOSPITAL_COMMUNITY): Payer: 59 | Admitting: Specialist

## 2020-04-09 ENCOUNTER — Other Ambulatory Visit: Payer: Self-pay

## 2020-04-09 DIAGNOSIS — M25522 Pain in left elbow: Secondary | ICD-10-CM

## 2020-04-09 DIAGNOSIS — M25622 Stiffness of left elbow, not elsewhere classified: Secondary | ICD-10-CM | POA: Diagnosis not present

## 2020-04-09 DIAGNOSIS — R29898 Other symptoms and signs involving the musculoskeletal system: Secondary | ICD-10-CM | POA: Diagnosis not present

## 2020-04-09 DIAGNOSIS — M79662 Pain in left lower leg: Secondary | ICD-10-CM | POA: Diagnosis not present

## 2020-04-09 DIAGNOSIS — R2689 Other abnormalities of gait and mobility: Secondary | ICD-10-CM | POA: Diagnosis not present

## 2020-04-09 DIAGNOSIS — M25632 Stiffness of left wrist, not elsewhere classified: Secondary | ICD-10-CM | POA: Diagnosis not present

## 2020-04-09 DIAGNOSIS — M6281 Muscle weakness (generalized): Secondary | ICD-10-CM | POA: Diagnosis not present

## 2020-04-09 DIAGNOSIS — R262 Difficulty in walking, not elsewhere classified: Secondary | ICD-10-CM | POA: Diagnosis not present

## 2020-04-09 DIAGNOSIS — M79632 Pain in left forearm: Secondary | ICD-10-CM

## 2020-04-12 ENCOUNTER — Telehealth (HOSPITAL_COMMUNITY): Payer: Self-pay | Admitting: Physical Therapy
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
## 2020-04-13 ENCOUNTER — Ambulatory Visit (HOSPITAL_COMMUNITY): Payer: 59 | Admitting: Specialist

## 2020-04-13 ENCOUNTER — Ambulatory Visit (HOSPITAL_COMMUNITY): Payer: 59 | Admitting: Physical Therapy

## 2020-04-13 ENCOUNTER — Other Ambulatory Visit: Payer: Self-pay

## 2020-04-13 ENCOUNTER — Encounter (HOSPITAL_COMMUNITY): Payer: Self-pay | Admitting: Specialist

## 2020-04-13 DIAGNOSIS — M79662 Pain in left lower leg: Secondary | ICD-10-CM | POA: Diagnosis not present

## 2020-04-13 DIAGNOSIS — M25622 Stiffness of left elbow, not elsewhere classified: Secondary | ICD-10-CM

## 2020-04-13 DIAGNOSIS — M6281 Muscle weakness (generalized): Secondary | ICD-10-CM | POA: Diagnosis not present

## 2020-04-13 DIAGNOSIS — M25522 Pain in left elbow: Secondary | ICD-10-CM

## 2020-04-13 DIAGNOSIS — M79632 Pain in left forearm: Secondary | ICD-10-CM

## 2020-04-13 DIAGNOSIS — R262 Difficulty in walking, not elsewhere classified: Secondary | ICD-10-CM

## 2020-04-13 DIAGNOSIS — M25632 Stiffness of left wrist, not elsewhere classified: Secondary | ICD-10-CM | POA: Diagnosis not present

## 2020-04-13 DIAGNOSIS — R29898 Other symptoms and signs involving the musculoskeletal system: Secondary | ICD-10-CM | POA: Diagnosis not present
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
## 2020-04-14 ENCOUNTER — Encounter (HOSPITAL_COMMUNITY): Payer: PRIVATE HEALTH INSURANCE | Admitting: Physical Therapy

## 2020-04-14 DIAGNOSIS — S93325S Dislocation of tarsometatarsal joint of left foot, sequela: Secondary | ICD-10-CM | POA: Diagnosis not present

## 2020-04-14 DIAGNOSIS — S52002A Unspecified fracture of upper end of left ulna, initial encounter for closed fracture: Secondary | ICD-10-CM | POA: Diagnosis not present

## 2020-04-16 ENCOUNTER — Other Ambulatory Visit: Payer: Self-pay

## 2020-04-16 ENCOUNTER — Encounter (HOSPITAL_COMMUNITY): Payer: Self-pay | Admitting: Specialist

## 2020-04-16 ENCOUNTER — Ambulatory Visit (HOSPITAL_COMMUNITY): Payer: 59

## 2020-04-16 ENCOUNTER — Ambulatory Visit (HOSPITAL_COMMUNITY): Payer: 59 | Admitting: Specialist

## 2020-04-16 ENCOUNTER — Encounter (HOSPITAL_COMMUNITY): Payer: Self-pay

## 2020-04-16 DIAGNOSIS — M79662 Pain in left lower leg: Secondary | ICD-10-CM

## 2020-04-16 DIAGNOSIS — M25622 Stiffness of left elbow, not elsewhere classified: Secondary | ICD-10-CM | POA: Diagnosis not present

## 2020-04-16 DIAGNOSIS — M79632 Pain in left forearm: Secondary | ICD-10-CM

## 2020-04-16 DIAGNOSIS — M6281 Muscle weakness (generalized): Secondary | ICD-10-CM | POA: Diagnosis not present

## 2020-04-16 DIAGNOSIS — R29898 Other symptoms and signs involving the musculoskeletal system: Secondary | ICD-10-CM

## 2020-04-16 DIAGNOSIS — R262 Difficulty in walking, not elsewhere classified: Secondary | ICD-10-CM

## 2020-04-16 DIAGNOSIS — M25632 Stiffness of left wrist, not elsewhere classified: Secondary | ICD-10-CM | POA: Diagnosis not present

## 2020-04-16 DIAGNOSIS — M25522 Pain in left elbow: Secondary | ICD-10-CM | POA: Diagnosis not present
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
## 2020-04-19 ENCOUNTER — Ambulatory Visit (HOSPITAL_COMMUNITY): Payer: 59

## 2020-04-19 ENCOUNTER — Encounter (HOSPITAL_COMMUNITY): Payer: Self-pay

## 2020-04-19 ENCOUNTER — Other Ambulatory Visit: Payer: Self-pay

## 2020-04-19 ENCOUNTER — Ambulatory Visit (HOSPITAL_COMMUNITY): Payer: 59 | Admitting: Physical Therapy

## 2020-04-19 ENCOUNTER — Encounter (HOSPITAL_COMMUNITY): Payer: Self-pay | Admitting: Physical Therapy

## 2020-04-19 DIAGNOSIS — M79632 Pain in left forearm: Secondary | ICD-10-CM

## 2020-04-19 DIAGNOSIS — R262 Difficulty in walking, not elsewhere classified: Secondary | ICD-10-CM

## 2020-04-19 DIAGNOSIS — M79662 Pain in left lower leg: Secondary | ICD-10-CM

## 2020-04-19 DIAGNOSIS — M25632 Stiffness of left wrist, not elsewhere classified: Secondary | ICD-10-CM

## 2020-04-19 DIAGNOSIS — M25622 Stiffness of left elbow, not elsewhere classified: Secondary | ICD-10-CM | POA: Diagnosis not present

## 2020-04-19 DIAGNOSIS — R29898 Other symptoms and signs involving the musculoskeletal system: Secondary | ICD-10-CM

## 2020-04-19 DIAGNOSIS — M6281 Muscle weakness (generalized): Secondary | ICD-10-CM | POA: Diagnosis not present

## 2020-04-19 DIAGNOSIS — M25522 Pain in left elbow: Secondary | ICD-10-CM | POA: Diagnosis not present

## 2020-04-19 NOTE — Patient Instructions (Signed)
Access Code: [facility] URL: [email or web address] Date: 04/19/2020 Prepared by: Greig Castilla Tydus Sanmiguel  Exercises Hooklying Isometric Hip Abduction with Belt - 1 x daily - 7 x weekly - 10 reps - 10 hold Supine Hip Adduction Isometric with Ball - 1 x daily - 7 x weekly - 10 reps - 10 second hold Prone Hip Extension - 1 x daily - 7 x weekly - 1 sets - 10 reps
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
## 2020-04-21 ENCOUNTER — Encounter (HOSPITAL_COMMUNITY): Payer: Self-pay

## 2020-04-21 ENCOUNTER — Ambulatory Visit (HOSPITAL_COMMUNITY): Payer: 59 | Attending: Orthopedic Surgery

## 2020-04-21 ENCOUNTER — Other Ambulatory Visit: Payer: Self-pay

## 2020-04-21 ENCOUNTER — Ambulatory Visit (INDEPENDENT_AMBULATORY_CARE_PROVIDER_SITE_OTHER): Payer: 59 | Admitting: Cardiothoracic Surgery

## 2020-04-21 ENCOUNTER — Encounter: Payer: Self-pay | Admitting: Cardiothoracic Surgery

## 2020-04-21 VITALS — BP 139/95 | HR 94 | Temp 97.7°F | Resp 18

## 2020-04-21 DIAGNOSIS — L089 Local infection of the skin and subcutaneous tissue, unspecified: Secondary | ICD-10-CM

## 2020-04-21 DIAGNOSIS — R262 Difficulty in walking, not elsewhere classified: Secondary | ICD-10-CM

## 2020-04-21 DIAGNOSIS — M79662 Pain in left lower leg: Secondary | ICD-10-CM

## 2020-04-21 DIAGNOSIS — R29898 Other symptoms and signs involving the musculoskeletal system: Secondary | ICD-10-CM | POA: Insufficient documentation

## 2020-04-21 DIAGNOSIS — M79632 Pain in left forearm: Secondary | ICD-10-CM | POA: Diagnosis not present

## 2020-04-21 DIAGNOSIS — T148XXA Other injury of unspecified body region, initial encounter: Secondary | ICD-10-CM | POA: Diagnosis not present

## 2020-04-21 DIAGNOSIS — M25522 Pain in left elbow: Secondary | ICD-10-CM | POA: Diagnosis not present

## 2020-04-21 DIAGNOSIS — M25622 Stiffness of left elbow, not elsewhere classified: Secondary | ICD-10-CM | POA: Insufficient documentation

## 2020-04-21 DIAGNOSIS — M6281 Muscle weakness (generalized): Secondary | ICD-10-CM | POA: Diagnosis not present

## 2020-04-21 DIAGNOSIS — M25632 Stiffness of left wrist, not elsewhere classified: Secondary | ICD-10-CM | POA: Insufficient documentation
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
## 2020-04-21 NOTE — Patient Instructions (Signed)
Bridging (Single Leg)    Lie on back with feet shoulder width apart and left leg straight. Rt knee bent.   Lift hips toward the ceiling while keeping leg straight. Hold 5 seconds. Repeat 10 times. Do 2 sessions per day.  http://gt2.exer.us/358   Copyright  VHI. All rights reserved.

## 2020-04-23 ENCOUNTER — Other Ambulatory Visit: Payer: Self-pay

## 2020-04-23 ENCOUNTER — Encounter (HOSPITAL_COMMUNITY): Payer: Self-pay | Admitting: Occupational Therapy

## 2020-04-23 ENCOUNTER — Ambulatory Visit (HOSPITAL_COMMUNITY): Payer: 59 | Admitting: Occupational Therapy

## 2020-04-23 DIAGNOSIS — M25632 Stiffness of left wrist, not elsewhere classified: Secondary | ICD-10-CM

## 2020-04-23 DIAGNOSIS — M25622 Stiffness of left elbow, not elsewhere classified: Secondary | ICD-10-CM

## 2020-04-23 DIAGNOSIS — M25522 Pain in left elbow: Secondary | ICD-10-CM

## 2020-04-23 DIAGNOSIS — R29898 Other symptoms and signs involving the musculoskeletal system: Secondary | ICD-10-CM

## 2020-04-23 DIAGNOSIS — M79632 Pain in left forearm: Secondary | ICD-10-CM

## 2020-04-23 DIAGNOSIS — R262 Difficulty in walking, not elsewhere classified: Secondary | ICD-10-CM | POA: Diagnosis not present

## 2020-04-23 DIAGNOSIS — M79662 Pain in left lower leg: Secondary | ICD-10-CM | POA: Diagnosis not present

## 2020-04-23 DIAGNOSIS — M6281 Muscle weakness (generalized): Secondary | ICD-10-CM | POA: Diagnosis not present
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
## 2020-04-27 ENCOUNTER — Ambulatory Visit (HOSPITAL_COMMUNITY): Payer: 59 | Admitting: Specialist

## 2020-04-27 ENCOUNTER — Encounter (HOSPITAL_COMMUNITY): Payer: PRIVATE HEALTH INSURANCE | Admitting: Physical Therapy

## 2020-04-27 ENCOUNTER — Other Ambulatory Visit: Payer: Self-pay

## 2020-04-27 ENCOUNTER — Encounter (HOSPITAL_COMMUNITY): Payer: Self-pay | Admitting: Specialist

## 2020-04-27 DIAGNOSIS — M79632 Pain in left forearm: Secondary | ICD-10-CM | POA: Diagnosis not present

## 2020-04-27 DIAGNOSIS — R29898 Other symptoms and signs involving the musculoskeletal system: Secondary | ICD-10-CM | POA: Diagnosis not present

## 2020-04-27 DIAGNOSIS — M25522 Pain in left elbow: Secondary | ICD-10-CM | POA: Diagnosis not present

## 2020-04-27 DIAGNOSIS — M25632 Stiffness of left wrist, not elsewhere classified: Secondary | ICD-10-CM

## 2020-04-27 DIAGNOSIS — M79662 Pain in left lower leg: Secondary | ICD-10-CM | POA: Diagnosis not present

## 2020-04-27 DIAGNOSIS — R262 Difficulty in walking, not elsewhere classified: Secondary | ICD-10-CM | POA: Diagnosis not present

## 2020-04-27 DIAGNOSIS — M6281 Muscle weakness (generalized): Secondary | ICD-10-CM | POA: Diagnosis not present

## 2020-04-27 DIAGNOSIS — M25622 Stiffness of left elbow, not elsewhere classified: Secondary | ICD-10-CM
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
## 2020-04-28 ENCOUNTER — Encounter: Payer: Self-pay | Admitting: Cardiothoracic Surgery

## 2020-04-28 ENCOUNTER — Ambulatory Visit (INDEPENDENT_AMBULATORY_CARE_PROVIDER_SITE_OTHER): Payer: 59 | Admitting: Cardiothoracic Surgery

## 2020-04-28 VITALS — BP 128/89 | HR 104 | Temp 98.8°F | Resp 14 | Ht 72.0 in | Wt 184.0 lb

## 2020-04-28 DIAGNOSIS — T148XXS Other injury of unspecified body region, sequela: Secondary | ICD-10-CM | POA: Diagnosis not present

## 2020-04-28 DIAGNOSIS — S91302D Unspecified open wound, left foot, subsequent encounter: Secondary | ICD-10-CM

## 2020-04-28 DIAGNOSIS — T148XXA Other injury of unspecified body region, initial encounter: Secondary | ICD-10-CM

## 2020-04-28 DIAGNOSIS — L089 Local infection of the skin and subcutaneous tissue, unspecified: Secondary | ICD-10-CM

## 2020-04-28 NOTE — Progress Notes (Signed)
Wound Care Follow-Up   Left Foot Extending to Toes: Arterial Wounds   Measurements: 20x12.7x0.1 Wound Bed Description: 90% black eschar and 10% pink/yellow mix. Small amount of purulent drainage with odor. Edema and slow blanchability. Treatment: Cleansed site with anacept and gauze, applied betadine to dry eschar and silver alginate to open sites, ABD pad, kerlix and ace bandage.   Left Anterior Lower Leg-Proximal: Venous/Vascular Wound: Healed  Left Anterior Lower Leg-Distal: Venous/Vascualr Wound: Measurements: 5.7x5x1.1,smaller in length and width. Wound Bed Description: 50% black eschar, 40% yellow, and 10% bone. Small amount of serosanguinous drainage.  Treatment: Cleansed site with anacept and gauze, applied betadine to dry eschar and silver alginate to open sites, ABD pad, kerlix and ace bandage.   Left Lateral Lower Leg: Venous/Vascular Wound: Healed.  Decline noted to left anterior lower leg-proximal: bone visible. MD recommended patient see surgeon as soon as possible, Apt scheduled for September 14th but patients girlfriend stated she wants to call and see if she can get appointment moved up. Recommended patient go to the ED if leg declines further and if fever or any signs/symptoms of infection are present. Patient and girlfriend stated full understanding.   He returns today accompanied by his girlfriend.  He spent a [place] at the beach and believes that he used his lower extremity more than he has in the past.  He tells me that while at the beach he kept his leg more dependent than usual and there may be some additional swelling present.  He also told me today that when he was discharged from [facility] he was allowed to place some weight bearing as much as tolerated on his lower extremity.  Today he had his dressings in place.  His girlfriend has been changing him and she is a excellent healthcare provider.  The mid wound which we have described is being the left anterior lower leg  has a thick eschar distally but has some exposed bone which appears to be tibia superiorly.  There is an obvious fracture in the bone.  Whether this represents the true tibia or a bone fragment is unclear.  There is some exudate along the wound as well.  There is some exudate around the wound on the dorsum of the foot.  They have been treating this with silver alginate we will continue that.  I had a long discussion with him and his girlfriend about the need for him to see a orthopedic surgeon soon as possible.  They do have an appointment at [facility] next week.  Obviously I would like them to be seen sooner.  I am also concerned about the fragment of bone that is present.  He tells me he has rods and plates in place already.  I am mostly concerned about this area because I did not appreciate it last week.  Of course the entire area was covered with slough.    I did not make a return appointment for him today but we will continue to be available for his ongoing care.  I asked him to contact the orthopedic surgeons at [facility] and I provided him with my business card.  They may contact me at any time to discuss his ongoing care.  I did tell him that he should not place any weightbearing on his lower extremity at this time until he is further evaluated at [facility].  All of his questions were answered.  He will call us when he is ready to be seen back in our clinic.

## 2020-04-29 ENCOUNTER — Encounter (HOSPITAL_COMMUNITY): Payer: PRIVATE HEALTH INSURANCE | Admitting: Physical Therapy

## 2020-04-29 ENCOUNTER — Encounter (HOSPITAL_COMMUNITY): Payer: PRIVATE HEALTH INSURANCE | Admitting: Occupational Therapy

## 2020-05-03 ENCOUNTER — Encounter (HOSPITAL_COMMUNITY): Payer: PRIVATE HEALTH INSURANCE | Admitting: Physical Therapy

## 2020-05-04 ENCOUNTER — Telehealth (HOSPITAL_COMMUNITY): Payer: Self-pay | Admitting: Specialist

## 2020-05-04 DIAGNOSIS — S7292XA Unspecified fracture of left femur, initial encounter for closed fracture: Secondary | ICD-10-CM | POA: Diagnosis not present

## 2020-05-04 DIAGNOSIS — S72352F Displaced comminuted fracture of shaft of left femur, subsequent encounter for open fracture type IIIA, IIIB, or IIIC with routine healing: Secondary | ICD-10-CM | POA: Diagnosis not present

## 2020-05-04 DIAGNOSIS — S82452A Displaced comminuted fracture of shaft of left fibula, initial encounter for closed fracture: Secondary | ICD-10-CM | POA: Diagnosis not present

## 2020-05-04 DIAGNOSIS — S42402D Unspecified fracture of lower end of left humerus, subsequent encounter for fracture with routine healing: Secondary | ICD-10-CM | POA: Diagnosis not present

## 2020-05-04 DIAGNOSIS — S82262D Displaced segmental fracture of shaft of left tibia, subsequent encounter for closed fracture with routine healing: Secondary | ICD-10-CM | POA: Diagnosis not present

## 2020-05-04 DIAGNOSIS — Z7982 Long term (current) use of aspirin: Secondary | ICD-10-CM | POA: Diagnosis not present

## 2020-05-04 DIAGNOSIS — S52002D Unspecified fracture of upper end of left ulna, subsequent encounter for closed fracture with routine healing: Secondary | ICD-10-CM | POA: Diagnosis not present

## 2020-05-04 DIAGNOSIS — Z79899 Other long term (current) drug therapy: Secondary | ICD-10-CM | POA: Diagnosis not present

## 2020-05-04 DIAGNOSIS — S82302A Unspecified fracture of lower end of left tibia, initial encounter for closed fracture: Secondary | ICD-10-CM | POA: Diagnosis not present

## 2020-05-04 DIAGNOSIS — Z792 Long term (current) use of antibiotics: Secondary | ICD-10-CM | POA: Diagnosis not present

## 2020-05-04 NOTE — Telephone Encounter (Signed)
Pt did home covid test and tested positive cx tomorrow and r/s for 05/19/20

## 2020-05-05 ENCOUNTER — Ambulatory Visit (HOSPITAL_COMMUNITY): Payer: 59 | Admitting: Specialist

## 2020-05-05 ENCOUNTER — Encounter (HOSPITAL_COMMUNITY): Payer: PRIVATE HEALTH INSURANCE | Admitting: Physical Therapy

## 2020-05-07 DIAGNOSIS — Z029 Encounter for administrative examinations, unspecified: Secondary | ICD-10-CM

## 2020-05-10 ENCOUNTER — Encounter (HOSPITAL_COMMUNITY): Payer: PRIVATE HEALTH INSURANCE | Admitting: Physical Therapy

## 2020-05-10 DIAGNOSIS — S81802A Unspecified open wound, left lower leg, initial encounter: Secondary | ICD-10-CM | POA: Diagnosis not present

## 2020-05-10 DIAGNOSIS — Z6825 Body mass index (BMI) 25.0-25.9, adult: Secondary | ICD-10-CM | POA: Diagnosis not present

## 2020-05-10 DIAGNOSIS — R2689 Other abnormalities of gait and mobility: Secondary | ICD-10-CM | POA: Diagnosis not present

## 2020-05-13 ENCOUNTER — Telehealth: Payer: Self-pay | Admitting: *Deleted

## 2020-05-13 NOTE — Telephone Encounter (Signed)
Mother called and stated patient was seen by specialist today for leg wounds and the area had a foul odor and the specialist recommended the patient go to ER for evaluation. Mother and patient were wanting to avoid ER and wanted an appt here for evaluation. Patient advised to go straight to the ER for evaluation and treatment.

## 2020-05-14 NOTE — Telephone Encounter (Signed)
I agree with this approach. 

## 2020-05-18 ENCOUNTER — Telehealth (HOSPITAL_COMMUNITY): Payer: Self-pay | Admitting: Occupational Therapy

## 2020-05-18 ENCOUNTER — Ambulatory Visit (HOSPITAL_COMMUNITY): Payer: 59 | Admitting: Occupational Therapy

## 2020-05-18 ENCOUNTER — Ambulatory Visit: Payer: 59 | Admitting: Family Medicine

## 2020-05-18 NOTE — Telephone Encounter (Signed)
Called pt re: no show this am. Pt reports he is waiting on MDs for surgery, is unsure when that will be but will try to be at OT appt on Friday at 11:15.    Ezra Sites, OTR/L  [phone] 05/18/2020

## 2020-05-21 ENCOUNTER — Encounter (HOSPITAL_COMMUNITY): Payer: PRIVATE HEALTH INSURANCE | Admitting: Occupational Therapy

## 2020-05-24 ENCOUNTER — Ambulatory Visit (HOSPITAL_COMMUNITY): Payer: 59 | Attending: Orthopedic Surgery

## 2020-05-26 ENCOUNTER — Telehealth (HOSPITAL_COMMUNITY): Payer: Self-pay

## 2020-05-26 ENCOUNTER — Ambulatory Visit (HOSPITAL_COMMUNITY): Payer: 59

## 2020-05-26 DIAGNOSIS — L02612 Cutaneous abscess of left foot: Secondary | ICD-10-CM | POA: Diagnosis not present

## 2020-05-26 DIAGNOSIS — R918 Other nonspecific abnormal finding of lung field: Secondary | ICD-10-CM | POA: Diagnosis not present

## 2020-05-26 DIAGNOSIS — U071 COVID-19: Secondary | ICD-10-CM | POA: Diagnosis not present

## 2020-05-26 DIAGNOSIS — S52022B Displaced fracture of olecranon process without intraarticular extension of left ulna, initial encounter for open fracture type I or II: Secondary | ICD-10-CM | POA: Diagnosis not present

## 2020-05-26 DIAGNOSIS — B879 Myiasis, unspecified: Secondary | ICD-10-CM | POA: Diagnosis not present

## 2020-05-26 DIAGNOSIS — Z20822 Contact with and (suspected) exposure to covid-19: Secondary | ICD-10-CM | POA: Diagnosis not present

## 2020-05-26 DIAGNOSIS — S7291XA Unspecified fracture of right femur, initial encounter for closed fracture: Secondary | ICD-10-CM | POA: Diagnosis not present

## 2020-05-26 DIAGNOSIS — I96 Gangrene, not elsewhere classified: Secondary | ICD-10-CM | POA: Diagnosis not present

## 2020-05-26 DIAGNOSIS — Z6824 Body mass index (BMI) 24.0-24.9, adult: Secondary | ICD-10-CM | POA: Diagnosis not present

## 2020-05-26 DIAGNOSIS — Z7982 Long term (current) use of aspirin: Secondary | ICD-10-CM | POA: Diagnosis not present

## 2020-05-26 DIAGNOSIS — S82202A Unspecified fracture of shaft of left tibia, initial encounter for closed fracture: Secondary | ICD-10-CM | POA: Diagnosis not present

## 2020-05-26 DIAGNOSIS — S82202B Unspecified fracture of shaft of left tibia, initial encounter for open fracture type I or II: Secondary | ICD-10-CM | POA: Diagnosis not present

## 2020-05-26 DIAGNOSIS — S52252A Displaced comminuted fracture of shaft of ulna, left arm, initial encounter for closed fracture: Secondary | ICD-10-CM | POA: Diagnosis not present

## 2020-05-26 DIAGNOSIS — G8918 Other acute postprocedural pain: Secondary | ICD-10-CM | POA: Diagnosis not present

## 2020-05-26 DIAGNOSIS — S93602A Unspecified sprain of left foot, initial encounter: Secondary | ICD-10-CM | POA: Diagnosis not present

## 2020-05-26 DIAGNOSIS — A480 Gas gangrene: Secondary | ICD-10-CM | POA: Diagnosis not present

## 2020-05-26 NOTE — Telephone Encounter (Signed)
Called patient regarding recent 4 no shows. Requested patient call the clinic back to let us know what he'd like to do; place a hold on therapy, continue, or discharge. Patient may possibly be having surgery.   Limmie Patricia, OTR/L,CBIS  [phone]

## 2020-05-27 ENCOUNTER — Telehealth (HOSPITAL_COMMUNITY): Payer: Self-pay

## 2020-05-27 ENCOUNTER — Other Ambulatory Visit (HOSPITAL_COMMUNITY): Payer: Self-pay

## 2020-05-27 DIAGNOSIS — L02612 Cutaneous abscess of left foot: Secondary | ICD-10-CM | POA: Diagnosis not present

## 2020-05-27 DIAGNOSIS — I96 Gangrene, not elsewhere classified: Secondary | ICD-10-CM | POA: Diagnosis not present

## 2020-05-27 DIAGNOSIS — A480 Gas gangrene: Secondary | ICD-10-CM | POA: Diagnosis not present

## 2020-05-27 DIAGNOSIS — G8918 Other acute postprocedural pain: Secondary | ICD-10-CM | POA: Diagnosis not present

## 2020-05-27 DIAGNOSIS — S7291XA Unspecified fracture of right femur, initial encounter for closed fracture: Secondary | ICD-10-CM | POA: Diagnosis not present

## 2020-05-27 DIAGNOSIS — S93602A Unspecified sprain of left foot, initial encounter: Secondary | ICD-10-CM | POA: Diagnosis not present

## 2020-05-27 DIAGNOSIS — Z7982 Long term (current) use of aspirin: Secondary | ICD-10-CM | POA: Diagnosis not present

## 2020-05-27 DIAGNOSIS — S82202A Unspecified fracture of shaft of left tibia, initial encounter for closed fracture: Secondary | ICD-10-CM | POA: Diagnosis not present

## 2020-05-27 DIAGNOSIS — R918 Other nonspecific abnormal finding of lung field: Secondary | ICD-10-CM | POA: Diagnosis not present

## 2020-05-27 DIAGNOSIS — U071 COVID-19: Secondary | ICD-10-CM | POA: Diagnosis not present

## 2020-05-27 DIAGNOSIS — S52252A Displaced comminuted fracture of shaft of ulna, left arm, initial encounter for closed fracture: Secondary | ICD-10-CM | POA: Diagnosis not present

## 2020-05-27 NOTE — Telephone Encounter (Signed)
He is having part of the foot amputated and wants to cx until 06/12/20 per his mom [facility]).

## 2020-05-27 NOTE — Telephone Encounter (Signed)
He is having part of the foot amputated and wants to cx until 06/12/20 per his mom ( Mecheelle Pennington). 

## 2020-05-28 MED FILL — IBUPROFEN 800 MG TABS: 800 | 20 days supply | Qty: 60 | Fill #0

## 2020-05-28 MED FILL — CLINDAMYCIN HCL 300 MG CAPS: 300 | 14 days supply | Qty: 42 | Fill #0

## 2020-05-28 MED FILL — oxyCODONE HCL 5 MG TABS: 5 | 5 days supply | Qty: 10 | Fill #0

## 2020-05-31 ENCOUNTER — Ambulatory Visit (HOSPITAL_COMMUNITY): Payer: 59

## 2020-05-31 ENCOUNTER — Encounter (HOSPITAL_COMMUNITY): Payer: Self-pay

## 2020-05-31 ENCOUNTER — Telehealth: Payer: Self-pay

## 2020-05-31 DIAGNOSIS — Z20822 Contact with and (suspected) exposure to covid-19: Secondary | ICD-10-CM | POA: Diagnosis not present

## 2020-05-31 DIAGNOSIS — Z01818 Encounter for other preprocedural examination: Secondary | ICD-10-CM | POA: Diagnosis not present

## 2020-05-31 DIAGNOSIS — Z01812 Encounter for preprocedural laboratory examination: Secondary | ICD-10-CM | POA: Diagnosis not present

## 2020-05-31 NOTE — Telephone Encounter (Signed)
Patient has had disability paper upfront since August and now needing them completed first out of work was 02/20/2020 and he states specialist is releasing him to return in January to work will they need to fill this out due to the fact he has been seeing multiple specialists. Please advise paper are in your folder

## 2020-05-31 NOTE — Therapy (Signed)
[facility] [address] [place], [place], [address] Phone: [phone]   Fax:  [phone]  Patient Details  Name: [name] MRN: [id number] Date of Birth: 03/25/1991 Referring Provider:  No ref. provider found  Encounter Date: 05/31/2020  OCCUPATIONAL THERAPY DISCHARGE SUMMARY  Visits from Start of Care: 16  Current functional level related to goals / functional outcomes:  Patient being temporarily discharged from OT services at this time. He is currently undergoing multiple surgeries on his lower extremity and will not be able to participate in OT outpatient sessions at this time. He will contact the clinic when he is able to return with a new MD order.   OT LONG TERM GOAL #1    Title Patient will return to PLOF with all B/IADL, work, and leisure tasks using left upper extremity tasks.    Time 8    Period Weeks    Status On-going        OT LONG TERM GOAL #2   Title Patient will improve LUE A/ROM to WFL in order to complete overhead tasks and reaching tasks independently.    Time 8    Period Weeks    Status On-going        OT LONG TERM GOAL #3   Title Patient will improve LUE strength to 5/5 for improved ability to pick up tools at work.    Time 8    Period Weeks    Status On-going        OT LONG TERM GOAL #4   Title Patient will improve left grip strength by 40# and pinch strength by 8# for improved ability to manipulate tools at work.    Time 8    Period Weeks    Status On-going        OT LONG TERM GOAL #5   Title Patient will improve left hand FMC to WNL as evidence by completing Nine hole peg test in 30" or less.    Time 8    Period Weeks    Status On-going        OT LONG TERM GOAL #6   Title Patient will decrease pain to 1/10 or better in his left arm when completing functional tasks.    Time 8    Period Weeks    Status On-going        OT LONG TERM GOAL #7   Title  Patient will decrease fascial and scar restrictions from max to min or less in his left arm for improved moblity required for ADL tasks.    Time 8    Period Weeks    Status On-going       Remaining deficits: All deficits remain.   Education / Equipment: Self massage for myofacial release, vitamin E oil to healed scar, heat therapy for muscle tightness,  HEP for elbow, forearm, wrist and hand A/ROM, tendon glides, and theraputty for strengthening. Plan: Patient agrees to discharge.  Patient goals were not met. Patient is being discharged due to the patient's request.  ?????          , OTR/L,CBIS  [phone]  05/31/2020, 5:04 PM  [facility] [address] [place], [place], [address] Phone: [phone]   Fax:  [phone] 

## 2020-06-02 ENCOUNTER — Ambulatory Visit (HOSPITAL_COMMUNITY): Payer: 59

## 2020-06-02 ENCOUNTER — Telehealth: Payer: Self-pay | Admitting: Family Medicine

## 2020-06-02 DIAGNOSIS — S81802A Unspecified open wound, left lower leg, initial encounter: Secondary | ICD-10-CM | POA: Diagnosis not present

## 2020-06-02 DIAGNOSIS — S91302A Unspecified open wound, left foot, initial encounter: Secondary | ICD-10-CM | POA: Diagnosis not present

## 2020-06-02 DIAGNOSIS — S52022B Displaced fracture of olecranon process without intraarticular extension of left ulna, initial encounter for open fracture type I or II: Secondary | ICD-10-CM | POA: Diagnosis not present

## 2020-06-02 DIAGNOSIS — G8918 Other acute postprocedural pain: Secondary | ICD-10-CM | POA: Diagnosis not present

## 2020-06-02 DIAGNOSIS — I96 Gangrene, not elsewhere classified: Secondary | ICD-10-CM | POA: Diagnosis not present

## 2020-06-02 DIAGNOSIS — R2689 Other abnormalities of gait and mobility: Secondary | ICD-10-CM | POA: Diagnosis not present

## 2020-06-02 DIAGNOSIS — U071 COVID-19: Secondary | ICD-10-CM | POA: Diagnosis not present
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
## 2020-06-02 NOTE — Telephone Encounter (Signed)
I believe it is in the best interest of the patient had this filled out by his specialist see other message

## 2020-06-03 NOTE — Telephone Encounter (Signed)
Informed patient that form would be best filled out by specialist please send back in front so I can mail to patient.

## 2020-06-04 NOTE — Telephone Encounter (Signed)
It should be on my desk

## 2020-06-07 ENCOUNTER — Encounter (HOSPITAL_COMMUNITY): Payer: PRIVATE HEALTH INSURANCE

## 2020-06-08 ENCOUNTER — Other Ambulatory Visit (HOSPITAL_COMMUNITY): Payer: Self-pay | Admitting: Orthopedic Surgery

## 2020-06-08 DIAGNOSIS — Z79891 Long term (current) use of opiate analgesic: Secondary | ICD-10-CM | POA: Diagnosis not present

## 2020-06-08 DIAGNOSIS — T84623D Infection and inflammatory reaction due to internal fixation device of left tibia, subsequent encounter: Secondary | ICD-10-CM | POA: Diagnosis not present

## 2020-06-08 DIAGNOSIS — T8489XD Other specified complication of internal orthopedic prosthetic devices, implants and grafts, subsequent encounter: Secondary | ICD-10-CM | POA: Diagnosis not present

## 2020-06-08 DIAGNOSIS — S93325D Dislocation of tarsometatarsal joint of left foot, subsequent encounter: Secondary | ICD-10-CM | POA: Diagnosis not present

## 2020-06-08 MED FILL — oxyCODONE HCL 5 MG TABS: 5 | 10 days supply | Qty: 30 | Fill #0

## 2020-06-09 ENCOUNTER — Encounter (HOSPITAL_COMMUNITY): Payer: PRIVATE HEALTH INSURANCE

## 2020-06-09 DIAGNOSIS — T8189XA Other complications of procedures, not elsewhere classified, initial encounter: Secondary | ICD-10-CM | POA: Diagnosis not present

## 2020-06-09 DIAGNOSIS — R2689 Other abnormalities of gait and mobility: Secondary | ICD-10-CM | POA: Diagnosis not present

## 2020-06-10 DIAGNOSIS — Z79891 Long term (current) use of opiate analgesic: Secondary | ICD-10-CM | POA: Diagnosis not present

## 2020-06-10 DIAGNOSIS — T84623D Infection and inflammatory reaction due to internal fixation device of left tibia, subsequent encounter: Secondary | ICD-10-CM | POA: Diagnosis not present

## 2020-06-10 DIAGNOSIS — S93325D Dislocation of tarsometatarsal joint of left foot, subsequent encounter: Secondary | ICD-10-CM | POA: Diagnosis not present

## 2020-06-10 DIAGNOSIS — T8489XD Other specified complication of internal orthopedic prosthetic devices, implants and grafts, subsequent encounter: Secondary | ICD-10-CM | POA: Diagnosis not present

## 2020-06-14 ENCOUNTER — Encounter (HOSPITAL_COMMUNITY): Payer: PRIVATE HEALTH INSURANCE

## 2020-06-14 DIAGNOSIS — S93325D Dislocation of tarsometatarsal joint of left foot, subsequent encounter: Secondary | ICD-10-CM | POA: Diagnosis not present

## 2020-06-14 DIAGNOSIS — T84623D Infection and inflammatory reaction due to internal fixation device of left tibia, subsequent encounter: Secondary | ICD-10-CM | POA: Diagnosis not present

## 2020-06-14 DIAGNOSIS — T8489XD Other specified complication of internal orthopedic prosthetic devices, implants and grafts, subsequent encounter: Secondary | ICD-10-CM | POA: Diagnosis not present

## 2020-06-14 DIAGNOSIS — Z79891 Long term (current) use of opiate analgesic: Secondary | ICD-10-CM | POA: Diagnosis not present

## 2020-06-16 ENCOUNTER — Encounter (HOSPITAL_COMMUNITY): Payer: PRIVATE HEALTH INSURANCE

## 2020-06-17 DIAGNOSIS — T84623D Infection and inflammatory reaction due to internal fixation device of left tibia, subsequent encounter: Secondary | ICD-10-CM | POA: Diagnosis not present

## 2020-06-17 DIAGNOSIS — S93325D Dislocation of tarsometatarsal joint of left foot, subsequent encounter: Secondary | ICD-10-CM | POA: Diagnosis not present

## 2020-06-17 DIAGNOSIS — Z79891 Long term (current) use of opiate analgesic: Secondary | ICD-10-CM | POA: Diagnosis not present

## 2020-06-17 DIAGNOSIS — T8489XD Other specified complication of internal orthopedic prosthetic devices, implants and grafts, subsequent encounter: Secondary | ICD-10-CM | POA: Diagnosis not present

## 2020-06-19 DIAGNOSIS — S93325D Dislocation of tarsometatarsal joint of left foot, subsequent encounter: Secondary | ICD-10-CM | POA: Diagnosis not present

## 2020-06-19 DIAGNOSIS — T8489XD Other specified complication of internal orthopedic prosthetic devices, implants and grafts, subsequent encounter: Secondary | ICD-10-CM | POA: Diagnosis not present

## 2020-06-19 DIAGNOSIS — Z79891 Long term (current) use of opiate analgesic: Secondary | ICD-10-CM | POA: Diagnosis not present

## 2020-06-19 DIAGNOSIS — T84623D Infection and inflammatory reaction due to internal fixation device of left tibia, subsequent encounter: Secondary | ICD-10-CM | POA: Diagnosis not present

## 2020-06-21 DIAGNOSIS — T8189XA Other complications of procedures, not elsewhere classified, initial encounter: Secondary | ICD-10-CM | POA: Diagnosis not present

## 2020-06-22 ENCOUNTER — Other Ambulatory Visit (HOSPITAL_COMMUNITY): Payer: Self-pay | Admitting: Orthopedic Surgery

## 2020-06-22 DIAGNOSIS — S72352F Displaced comminuted fracture of shaft of left femur, subsequent encounter for open fracture type IIIA, IIIB, or IIIC with routine healing: Secondary | ICD-10-CM | POA: Diagnosis not present

## 2020-06-22 DIAGNOSIS — S82452D Displaced comminuted fracture of shaft of left fibula, subsequent encounter for closed fracture with routine healing: Secondary | ICD-10-CM | POA: Diagnosis not present

## 2020-06-22 DIAGNOSIS — S82262E Displaced segmental fracture of shaft of left tibia, subsequent encounter for open fracture type I or II with routine healing: Secondary | ICD-10-CM | POA: Diagnosis not present

## 2020-06-22 DIAGNOSIS — Z79899 Other long term (current) drug therapy: Secondary | ICD-10-CM | POA: Diagnosis not present

## 2020-06-22 DIAGNOSIS — S52202D Unspecified fracture of shaft of left ulna, subsequent encounter for closed fracture with routine healing: Secondary | ICD-10-CM | POA: Diagnosis not present

## 2020-06-22 DIAGNOSIS — S52202A Unspecified fracture of shaft of left ulna, initial encounter for closed fracture: Secondary | ICD-10-CM | POA: Diagnosis not present

## 2020-06-22 DIAGNOSIS — S52002A Unspecified fracture of upper end of left ulna, initial encounter for closed fracture: Secondary | ICD-10-CM | POA: Diagnosis not present

## 2020-06-22 DIAGNOSIS — S79102A Unspecified physeal fracture of lower end of left femur, initial encounter for closed fracture: Secondary | ICD-10-CM | POA: Diagnosis not present

## 2020-06-22 MED FILL — AMITRIPTYLINE HCL 25 MG TAB: 25 | 22 days supply | Qty: 45 | Fill #0

## 2020-06-23 DIAGNOSIS — T8140XA Infection following a procedure, unspecified, initial encounter: Secondary | ICD-10-CM | POA: Diagnosis not present

## 2020-06-23 DIAGNOSIS — M868X7 Other osteomyelitis, ankle and foot: Secondary | ICD-10-CM | POA: Diagnosis not present

## 2020-06-23 DIAGNOSIS — Z792 Long term (current) use of antibiotics: Secondary | ICD-10-CM | POA: Diagnosis not present

## 2020-06-23 DIAGNOSIS — S93325D Dislocation of tarsometatarsal joint of left foot, subsequent encounter: Secondary | ICD-10-CM | POA: Diagnosis not present

## 2020-06-23 DIAGNOSIS — T8489XD Other specified complication of internal orthopedic prosthetic devices, implants and grafts, subsequent encounter: Secondary | ICD-10-CM | POA: Diagnosis not present

## 2020-06-23 DIAGNOSIS — M869 Osteomyelitis, unspecified: Secondary | ICD-10-CM | POA: Diagnosis not present

## 2020-06-23 DIAGNOSIS — M85872 Other specified disorders of bone density and structure, left ankle and foot: Secondary | ICD-10-CM | POA: Diagnosis not present

## 2020-06-23 DIAGNOSIS — R509 Fever, unspecified: Secondary | ICD-10-CM | POA: Diagnosis not present

## 2020-06-23 DIAGNOSIS — Z79891 Long term (current) use of opiate analgesic: Secondary | ICD-10-CM | POA: Diagnosis not present

## 2020-06-23 DIAGNOSIS — T84623D Infection and inflammatory reaction due to internal fixation device of left tibia, subsequent encounter: Secondary | ICD-10-CM | POA: Diagnosis not present

## 2020-06-24 ENCOUNTER — Other Ambulatory Visit (HOSPITAL_COMMUNITY): Payer: Self-pay

## 2020-06-24 DIAGNOSIS — M868X7 Other osteomyelitis, ankle and foot: Secondary | ICD-10-CM | POA: Diagnosis not present

## 2020-06-24 DIAGNOSIS — Z792 Long term (current) use of antibiotics: Secondary | ICD-10-CM | POA: Diagnosis not present

## 2020-06-24 MED FILL — SULFAMETHOXAZOLE-TMP DS TAB: 800-160 | 14 days supply | Qty: 56 | Fill #0

## 2020-06-25 DIAGNOSIS — Z79891 Long term (current) use of opiate analgesic: Secondary | ICD-10-CM | POA: Diagnosis not present

## 2020-06-25 DIAGNOSIS — S93325D Dislocation of tarsometatarsal joint of left foot, subsequent encounter: Secondary | ICD-10-CM | POA: Diagnosis not present

## 2020-06-25 DIAGNOSIS — T8489XD Other specified complication of internal orthopedic prosthetic devices, implants and grafts, subsequent encounter: Secondary | ICD-10-CM | POA: Diagnosis not present

## 2020-06-25 DIAGNOSIS — T84623D Infection and inflammatory reaction due to internal fixation device of left tibia, subsequent encounter: Secondary | ICD-10-CM | POA: Diagnosis not present

## 2020-06-25 DIAGNOSIS — I96 Gangrene, not elsewhere classified: Secondary | ICD-10-CM | POA: Diagnosis not present

## 2020-06-28 DIAGNOSIS — T84623D Infection and inflammatory reaction due to internal fixation device of left tibia, subsequent encounter: Secondary | ICD-10-CM | POA: Diagnosis not present

## 2020-06-28 DIAGNOSIS — S93325D Dislocation of tarsometatarsal joint of left foot, subsequent encounter: Secondary | ICD-10-CM | POA: Diagnosis not present

## 2020-06-28 DIAGNOSIS — Z79891 Long term (current) use of opiate analgesic: Secondary | ICD-10-CM | POA: Diagnosis not present

## 2020-06-28 DIAGNOSIS — T8489XD Other specified complication of internal orthopedic prosthetic devices, implants and grafts, subsequent encounter: Secondary | ICD-10-CM | POA: Diagnosis not present

## 2020-06-30 DIAGNOSIS — S93325D Dislocation of tarsometatarsal joint of left foot, subsequent encounter: Secondary | ICD-10-CM | POA: Diagnosis not present

## 2020-06-30 DIAGNOSIS — Z79891 Long term (current) use of opiate analgesic: Secondary | ICD-10-CM | POA: Diagnosis not present

## 2020-06-30 DIAGNOSIS — T84623D Infection and inflammatory reaction due to internal fixation device of left tibia, subsequent encounter: Secondary | ICD-10-CM | POA: Diagnosis not present

## 2020-06-30 DIAGNOSIS — T8489XD Other specified complication of internal orthopedic prosthetic devices, implants and grafts, subsequent encounter: Secondary | ICD-10-CM | POA: Diagnosis not present

## 2020-07-01 MED FILL — GABAPENTIN 300 MG CAPSULE: 300 | 30 days supply | Qty: 90 | Fill #1

## 2020-07-02 DIAGNOSIS — I96 Gangrene, not elsewhere classified: Secondary | ICD-10-CM | POA: Diagnosis not present

## 2020-07-02 DIAGNOSIS — Z452 Encounter for adjustment and management of vascular access device: Secondary | ICD-10-CM | POA: Diagnosis not present

## 2020-07-02 DIAGNOSIS — L089 Local infection of the skin and subcutaneous tissue, unspecified: Secondary | ICD-10-CM | POA: Diagnosis not present

## 2020-07-02 DIAGNOSIS — R2689 Other abnormalities of gait and mobility: Secondary | ICD-10-CM | POA: Diagnosis not present

## 2020-07-02 DIAGNOSIS — S91302A Unspecified open wound, left foot, initial encounter: Secondary | ICD-10-CM | POA: Diagnosis not present

## 2020-07-03 DIAGNOSIS — I96 Gangrene, not elsewhere classified: Secondary | ICD-10-CM | POA: Diagnosis not present

## 2020-07-03 DIAGNOSIS — R2689 Other abnormalities of gait and mobility: Secondary | ICD-10-CM | POA: Diagnosis not present

## 2020-07-04 DIAGNOSIS — R2689 Other abnormalities of gait and mobility: Secondary | ICD-10-CM | POA: Diagnosis not present

## 2020-07-04 DIAGNOSIS — I96 Gangrene, not elsewhere classified: Secondary | ICD-10-CM | POA: Diagnosis not present

## 2020-07-05 ENCOUNTER — Other Ambulatory Visit (HOSPITAL_COMMUNITY)
Admission: RE | Admit: 2020-07-05 | Discharge: 2020-07-05 | Disposition: A | Discharge status: Home / Self Care | Payer: 59 | Source: Other Acute Inpatient Hospital | Attending: Infectious Disease | Admitting: Infectious Disease

## 2020-07-05 DIAGNOSIS — T84623A Infection and inflammatory reaction due to internal fixation device of left tibia, initial encounter: Secondary | ICD-10-CM | POA: Diagnosis not present

## 2020-07-05 DIAGNOSIS — R2689 Other abnormalities of gait and mobility: Secondary | ICD-10-CM | POA: Diagnosis not present

## 2020-07-05 DIAGNOSIS — I96 Gangrene, not elsewhere classified: Secondary | ICD-10-CM | POA: Diagnosis not present

## 2020-07-05 DIAGNOSIS — X58XXXA Exposure to other specified factors, initial encounter: Secondary | ICD-10-CM | POA: Insufficient documentation

## 2020-07-05 LAB — CBC WITH DIFFERENTIAL/PLATELET
Abs Immature Granulocytes: 0.02 10*3/uL (ref 0.00–0.07)
Basophils Absolute: 0.1 10*3/uL (ref 0.0–0.1)
Basophils Relative: 1 %
Eosinophils Absolute: 0.2 10*3/uL (ref 0.0–0.5)
Eosinophils Relative: 4 %
HCT: 34.9 % — ABNORMAL LOW (ref 39.0–52.0)
Hemoglobin: 10.7 g/dL — ABNORMAL LOW (ref 13.0–17.0)
Immature Granulocytes: 0 %
Lymphocytes Relative: 34 %
Lymphs Abs: 1.7 10*3/uL (ref 0.7–4.0)
MCH: 24.4 pg — ABNORMAL LOW (ref 26.0–34.0)
MCHC: 30.7 g/dL (ref 30.0–36.0)
MCV: 79.7 fL — ABNORMAL LOW (ref 80.0–100.0)
Monocytes Absolute: 0.4 10*3/uL (ref 0.1–1.0)
Monocytes Relative: 8 %
Neutro Abs: 2.7 10*3/uL (ref 1.7–7.7)
Neutrophils Relative %: 53 %
Platelets: 417 10*3/uL — ABNORMAL HIGH (ref 150–400)
RBC: 4.38 MIL/uL (ref 4.22–5.81)
RDW: 16.5 % — ABNORMAL HIGH (ref 11.5–15.5)
WBC: 5.2 10*3/uL (ref 4.0–10.5)
nRBC: 0 % (ref 0.0–0.2)

## 2020-07-05 LAB — COMPREHENSIVE METABOLIC PANEL
ALT: 12 U/L (ref 0–44)
AST: 17 U/L (ref 15–41)
Albumin: 3.3 g/dL — ABNORMAL LOW (ref 3.5–5.0)
Alkaline Phosphatase: 69 U/L (ref 38–126)
Anion gap: 9 (ref 5–15)
BUN: <5 mg/dL — ABNORMAL LOW (ref 6–20)
CO2: 26 mmol/L (ref 22–32)
Calcium: 8.4 mg/dL — ABNORMAL LOW (ref 8.9–10.3)
Chloride: 104 mmol/L (ref 98–111)
Creatinine, Ser: 0.76 mg/dL (ref 0.61–1.24)
GFR, Estimated: >60 mL/min (ref >60)
Glucose, Bld: 93 mg/dL (ref 70–99)
Potassium: 3.4 mmol/L — ABNORMAL LOW (ref 3.5–5.1)
Sodium: 139 mmol/L (ref 135–145)
Total Bilirubin: 0.2 mg/dL — ABNORMAL LOW (ref 0.3–1.2)
Total Protein: 6.1 g/dL — ABNORMAL LOW (ref 6.5–8.1)

## 2020-07-05 LAB — VANCOMYCIN, TROUGH: Vancomycin Tr: 10 ug/mL — ABNORMAL LOW (ref 15–20)

## 2020-07-05 LAB — C-REACTIVE PROTEIN: CRP: 1.7 mg/dL — ABNORMAL HIGH (ref <1.0)

## 2020-07-06 DIAGNOSIS — R2689 Other abnormalities of gait and mobility: Secondary | ICD-10-CM | POA: Diagnosis not present

## 2020-07-06 DIAGNOSIS — I96 Gangrene, not elsewhere classified: Secondary | ICD-10-CM | POA: Diagnosis not present

## 2020-07-07 DIAGNOSIS — R2689 Other abnormalities of gait and mobility: Secondary | ICD-10-CM | POA: Diagnosis not present

## 2020-07-07 DIAGNOSIS — I96 Gangrene, not elsewhere classified: Secondary | ICD-10-CM | POA: Diagnosis not present

## 2020-07-08 DIAGNOSIS — I96 Gangrene, not elsewhere classified: Secondary | ICD-10-CM | POA: Diagnosis not present

## 2020-07-08 DIAGNOSIS — R2689 Other abnormalities of gait and mobility: Secondary | ICD-10-CM | POA: Diagnosis not present

## 2020-07-09 ENCOUNTER — Other Ambulatory Visit (HOSPITAL_COMMUNITY)
Admission: RE | Admit: 2020-07-09 | Discharge: 2020-07-09 | Disposition: A | Discharge status: Home / Self Care | Payer: 59 | Source: Other Acute Inpatient Hospital | Attending: Infectious Disease | Admitting: Infectious Disease

## 2020-07-09 DIAGNOSIS — T8189XA Other complications of procedures, not elsewhere classified, initial encounter: Secondary | ICD-10-CM | POA: Diagnosis not present

## 2020-07-09 DIAGNOSIS — T84623D Infection and inflammatory reaction due to internal fixation device of left tibia, subsequent encounter: Secondary | ICD-10-CM | POA: Diagnosis not present

## 2020-07-09 DIAGNOSIS — T8489XD Other specified complication of internal orthopedic prosthetic devices, implants and grafts, subsequent encounter: Secondary | ICD-10-CM | POA: Diagnosis not present

## 2020-07-09 DIAGNOSIS — Z5181 Encounter for therapeutic drug level monitoring: Secondary | ICD-10-CM | POA: Insufficient documentation

## 2020-07-09 DIAGNOSIS — I96 Gangrene, not elsewhere classified: Secondary | ICD-10-CM | POA: Diagnosis not present

## 2020-07-09 DIAGNOSIS — Z79891 Long term (current) use of opiate analgesic: Secondary | ICD-10-CM | POA: Diagnosis not present

## 2020-07-09 DIAGNOSIS — R2689 Other abnormalities of gait and mobility: Secondary | ICD-10-CM | POA: Diagnosis not present

## 2020-07-09 DIAGNOSIS — S93325D Dislocation of tarsometatarsal joint of left foot, subsequent encounter: Secondary | ICD-10-CM | POA: Diagnosis not present

## 2020-07-09 LAB — BASIC METABOLIC PANEL
Anion gap: 7 (ref 5–15)
BUN: <5 mg/dL — ABNORMAL LOW (ref 6–20)
CO2: 27 mmol/L (ref 22–32)
Calcium: 8.3 mg/dL — ABNORMAL LOW (ref 8.9–10.3)
Chloride: 103 mmol/L (ref 98–111)
Creatinine, Ser: 0.6 mg/dL — ABNORMAL LOW (ref 0.61–1.24)
GFR, Estimated: >60 mL/min (ref >60)
Glucose, Bld: 107 mg/dL — ABNORMAL HIGH (ref 70–99)
Potassium: 3.7 mmol/L (ref 3.5–5.1)
Sodium: 137 mmol/L (ref 135–145)

## 2020-07-09 LAB — VANCOMYCIN, TROUGH: Vancomycin Tr: 12 ug/mL — ABNORMAL LOW (ref 15–20)

## 2020-07-10 DIAGNOSIS — Z8616 Personal history of COVID-19: Secondary | ICD-10-CM | POA: Diagnosis not present

## 2020-07-10 DIAGNOSIS — R21 Rash and other nonspecific skin eruption: Secondary | ICD-10-CM | POA: Diagnosis not present

## 2020-07-10 DIAGNOSIS — Z452 Encounter for adjustment and management of vascular access device: Secondary | ICD-10-CM | POA: Diagnosis not present

## 2020-07-10 DIAGNOSIS — T8489XD Other specified complication of internal orthopedic prosthetic devices, implants and grafts, subsequent encounter: Secondary | ICD-10-CM | POA: Diagnosis not present

## 2020-07-10 DIAGNOSIS — Z8619 Personal history of other infectious and parasitic diseases: Secondary | ICD-10-CM | POA: Diagnosis not present

## 2020-07-10 DIAGNOSIS — R509 Fever, unspecified: Secondary | ICD-10-CM | POA: Diagnosis not present

## 2020-07-10 DIAGNOSIS — Z6824 Body mass index (BMI) 24.0-24.9, adult: Secondary | ICD-10-CM | POA: Diagnosis not present

## 2020-07-10 DIAGNOSIS — M86172 Other acute osteomyelitis, left ankle and foot: Secondary | ICD-10-CM | POA: Diagnosis not present

## 2020-07-10 DIAGNOSIS — Z792 Long term (current) use of antibiotics: Secondary | ICD-10-CM | POA: Diagnosis not present

## 2020-07-10 DIAGNOSIS — M86672 Other chronic osteomyelitis, left ankle and foot: Secondary | ICD-10-CM | POA: Diagnosis not present

## 2020-07-10 DIAGNOSIS — M869 Osteomyelitis, unspecified: Secondary | ICD-10-CM | POA: Diagnosis not present

## 2020-07-10 DIAGNOSIS — Z89422 Acquired absence of other left toe(s): Secondary | ICD-10-CM | POA: Diagnosis not present

## 2020-07-10 DIAGNOSIS — T84623A Infection and inflammatory reaction due to internal fixation device of left tibia, initial encounter: Secondary | ICD-10-CM | POA: Diagnosis not present

## 2020-07-10 DIAGNOSIS — M7989 Other specified soft tissue disorders: Secondary | ICD-10-CM | POA: Diagnosis not present

## 2020-07-10 DIAGNOSIS — T82898A Other specified complication of vascular prosthetic devices, implants and grafts, initial encounter: Secondary | ICD-10-CM | POA: Diagnosis not present

## 2020-07-10 DIAGNOSIS — I96 Gangrene, not elsewhere classified: Secondary | ICD-10-CM | POA: Diagnosis not present

## 2020-07-10 DIAGNOSIS — R2689 Other abnormalities of gait and mobility: Secondary | ICD-10-CM | POA: Diagnosis not present

## 2020-07-10 DIAGNOSIS — S82409D Unspecified fracture of shaft of unspecified fibula, subsequent encounter for closed fracture with routine healing: Secondary | ICD-10-CM | POA: Diagnosis not present

## 2020-07-10 DIAGNOSIS — S82302A Unspecified fracture of lower end of left tibia, initial encounter for closed fracture: Secondary | ICD-10-CM | POA: Diagnosis not present

## 2020-07-10 DIAGNOSIS — Z79891 Long term (current) use of opiate analgesic: Secondary | ICD-10-CM | POA: Diagnosis not present

## 2020-07-10 DIAGNOSIS — M86272 Subacute osteomyelitis, left ankle and foot: Secondary | ICD-10-CM | POA: Diagnosis not present

## 2020-07-10 DIAGNOSIS — J9811 Atelectasis: Secondary | ICD-10-CM | POA: Diagnosis not present

## 2020-07-10 DIAGNOSIS — S92255A Nondisplaced fracture of navicular [scaphoid] of left foot, initial encounter for closed fracture: Secondary | ICD-10-CM | POA: Diagnosis not present

## 2020-07-10 DIAGNOSIS — S82832A Other fracture of upper and lower end of left fibula, initial encounter for closed fracture: Secondary | ICD-10-CM | POA: Diagnosis not present

## 2020-07-10 DIAGNOSIS — D72819 Decreased white blood cell count, unspecified: Secondary | ICD-10-CM | POA: Diagnosis not present

## 2020-07-10 DIAGNOSIS — T84623D Infection and inflammatory reaction due to internal fixation device of left tibia, subsequent encounter: Secondary | ICD-10-CM | POA: Diagnosis not present

## 2020-07-10 DIAGNOSIS — S93325D Dislocation of tarsometatarsal joint of left foot, subsequent encounter: Secondary | ICD-10-CM | POA: Diagnosis not present

## 2020-07-10 DIAGNOSIS — S52022A Displaced fracture of olecranon process without intraarticular extension of left ulna, initial encounter for closed fracture: Secondary | ICD-10-CM | POA: Diagnosis not present

## 2020-07-10 DIAGNOSIS — T50905A Adverse effect of unspecified drugs, medicaments and biological substances, initial encounter: Secondary | ICD-10-CM | POA: Diagnosis not present

## 2020-07-10 DIAGNOSIS — B9620 Unspecified Escherichia coli [E. coli] as the cause of diseases classified elsewhere: Secondary | ICD-10-CM | POA: Diagnosis not present

## 2020-07-10 DIAGNOSIS — T80211A Bloodstream infection due to central venous catheter, initial encounter: Secondary | ICD-10-CM | POA: Diagnosis not present

## 2020-07-10 DIAGNOSIS — Z959 Presence of cardiac and vascular implant and graft, unspecified: Secondary | ICD-10-CM | POA: Diagnosis not present

## 2020-07-10 DIAGNOSIS — T80212A Local infection due to central venous catheter, initial encounter: Secondary | ICD-10-CM | POA: Diagnosis not present

## 2020-07-10 DIAGNOSIS — T8579XA Infection and inflammatory reaction due to other internal prosthetic devices, implants and grafts, initial encounter: Secondary | ICD-10-CM | POA: Diagnosis not present

## 2020-07-10 DIAGNOSIS — S72302F Unspecified fracture of shaft of left femur, subsequent encounter for open fracture type IIIA, IIIB, or IIIC with routine healing: Secondary | ICD-10-CM | POA: Diagnosis not present

## 2020-07-11 DIAGNOSIS — R509 Fever, unspecified: Secondary | ICD-10-CM | POA: Diagnosis not present

## 2020-07-11 DIAGNOSIS — M86272 Subacute osteomyelitis, left ankle and foot: Secondary | ICD-10-CM | POA: Diagnosis not present

## 2020-07-11 DIAGNOSIS — I96 Gangrene, not elsewhere classified: Secondary | ICD-10-CM | POA: Diagnosis not present

## 2020-07-11 DIAGNOSIS — M7989 Other specified soft tissue disorders: Secondary | ICD-10-CM | POA: Diagnosis not present

## 2020-07-11 DIAGNOSIS — T80212A Local infection due to central venous catheter, initial encounter: Secondary | ICD-10-CM | POA: Diagnosis not present

## 2020-07-11 DIAGNOSIS — R2689 Other abnormalities of gait and mobility: Secondary | ICD-10-CM | POA: Diagnosis not present

## 2020-07-11 DIAGNOSIS — Z792 Long term (current) use of antibiotics: Secondary | ICD-10-CM | POA: Diagnosis not present

## 2020-07-11 DIAGNOSIS — T80211A Bloodstream infection due to central venous catheter, initial encounter: Secondary | ICD-10-CM | POA: Diagnosis not present

## 2020-07-11 DIAGNOSIS — Z8619 Personal history of other infectious and parasitic diseases: Secondary | ICD-10-CM | POA: Diagnosis not present

## 2020-07-11 DIAGNOSIS — M86172 Other acute osteomyelitis, left ankle and foot: Secondary | ICD-10-CM | POA: Diagnosis not present

## 2020-07-11 DIAGNOSIS — S82302A Unspecified fracture of lower end of left tibia, initial encounter for closed fracture: Secondary | ICD-10-CM | POA: Diagnosis not present

## 2020-07-12 DIAGNOSIS — T80211A Bloodstream infection due to central venous catheter, initial encounter: Secondary | ICD-10-CM | POA: Diagnosis not present

## 2020-07-12 DIAGNOSIS — Z959 Presence of cardiac and vascular implant and graft, unspecified: Secondary | ICD-10-CM | POA: Diagnosis not present

## 2020-07-12 DIAGNOSIS — R509 Fever, unspecified: Secondary | ICD-10-CM | POA: Diagnosis not present

## 2020-07-12 DIAGNOSIS — T80212A Local infection due to central venous catheter, initial encounter: Secondary | ICD-10-CM | POA: Diagnosis not present

## 2020-07-12 DIAGNOSIS — Z452 Encounter for adjustment and management of vascular access device: Secondary | ICD-10-CM | POA: Diagnosis not present

## 2020-07-12 DIAGNOSIS — R21 Rash and other nonspecific skin eruption: Secondary | ICD-10-CM | POA: Diagnosis not present

## 2020-07-12 DIAGNOSIS — Z89422 Acquired absence of other left toe(s): Secondary | ICD-10-CM | POA: Diagnosis not present

## 2020-07-12 DIAGNOSIS — M86272 Subacute osteomyelitis, left ankle and foot: Secondary | ICD-10-CM | POA: Diagnosis not present

## 2020-07-12 DIAGNOSIS — I96 Gangrene, not elsewhere classified: Secondary | ICD-10-CM | POA: Diagnosis not present

## 2020-07-12 DIAGNOSIS — M86172 Other acute osteomyelitis, left ankle and foot: Secondary | ICD-10-CM | POA: Diagnosis not present

## 2020-07-12 DIAGNOSIS — R2689 Other abnormalities of gait and mobility: Secondary | ICD-10-CM | POA: Diagnosis not present

## 2020-07-12 DIAGNOSIS — Z792 Long term (current) use of antibiotics: Secondary | ICD-10-CM | POA: Diagnosis not present

## 2020-07-13 DIAGNOSIS — T80212A Local infection due to central venous catheter, initial encounter: Secondary | ICD-10-CM | POA: Diagnosis not present

## 2020-07-13 DIAGNOSIS — Z6824 Body mass index (BMI) 24.0-24.9, adult: Secondary | ICD-10-CM | POA: Diagnosis not present

## 2020-07-13 DIAGNOSIS — Z89422 Acquired absence of other left toe(s): Secondary | ICD-10-CM | POA: Diagnosis not present

## 2020-07-13 DIAGNOSIS — R509 Fever, unspecified: Secondary | ICD-10-CM | POA: Diagnosis not present

## 2020-07-13 DIAGNOSIS — I96 Gangrene, not elsewhere classified: Secondary | ICD-10-CM | POA: Diagnosis not present

## 2020-07-13 DIAGNOSIS — R2689 Other abnormalities of gait and mobility: Secondary | ICD-10-CM | POA: Diagnosis not present

## 2020-07-13 DIAGNOSIS — R21 Rash and other nonspecific skin eruption: Secondary | ICD-10-CM | POA: Diagnosis not present

## 2020-07-13 DIAGNOSIS — M86172 Other acute osteomyelitis, left ankle and foot: Secondary | ICD-10-CM | POA: Diagnosis not present

## 2020-07-14 DIAGNOSIS — I96 Gangrene, not elsewhere classified: Secondary | ICD-10-CM | POA: Diagnosis not present

## 2020-07-14 DIAGNOSIS — T8579XA Infection and inflammatory reaction due to other internal prosthetic devices, implants and grafts, initial encounter: Secondary | ICD-10-CM | POA: Diagnosis not present

## 2020-07-14 DIAGNOSIS — M86672 Other chronic osteomyelitis, left ankle and foot: Secondary | ICD-10-CM | POA: Diagnosis not present

## 2020-07-14 DIAGNOSIS — M86172 Other acute osteomyelitis, left ankle and foot: Secondary | ICD-10-CM | POA: Diagnosis not present

## 2020-07-14 DIAGNOSIS — R21 Rash and other nonspecific skin eruption: Secondary | ICD-10-CM | POA: Diagnosis not present

## 2020-07-14 DIAGNOSIS — Z89422 Acquired absence of other left toe(s): Secondary | ICD-10-CM | POA: Diagnosis not present

## 2020-07-14 DIAGNOSIS — Z6824 Body mass index (BMI) 24.0-24.9, adult: Secondary | ICD-10-CM | POA: Diagnosis not present

## 2020-07-14 DIAGNOSIS — R2689 Other abnormalities of gait and mobility: Secondary | ICD-10-CM | POA: Diagnosis not present

## 2020-07-14 DIAGNOSIS — R509 Fever, unspecified: Secondary | ICD-10-CM | POA: Diagnosis not present

## 2020-07-14 DIAGNOSIS — T80211A Bloodstream infection due to central venous catheter, initial encounter: Secondary | ICD-10-CM | POA: Diagnosis not present

## 2020-07-15 DIAGNOSIS — M86172 Other acute osteomyelitis, left ankle and foot: Secondary | ICD-10-CM | POA: Diagnosis not present

## 2020-07-15 DIAGNOSIS — Z89422 Acquired absence of other left toe(s): Secondary | ICD-10-CM | POA: Diagnosis not present

## 2020-07-15 DIAGNOSIS — R21 Rash and other nonspecific skin eruption: Secondary | ICD-10-CM | POA: Diagnosis not present

## 2020-07-15 DIAGNOSIS — R509 Fever, unspecified: Secondary | ICD-10-CM | POA: Diagnosis not present

## 2020-07-15 DIAGNOSIS — Z6824 Body mass index (BMI) 24.0-24.9, adult: Secondary | ICD-10-CM | POA: Diagnosis not present

## 2020-07-16 DIAGNOSIS — R509 Fever, unspecified: Secondary | ICD-10-CM | POA: Diagnosis not present

## 2020-07-16 DIAGNOSIS — R21 Rash and other nonspecific skin eruption: Secondary | ICD-10-CM | POA: Diagnosis not present

## 2020-07-16 DIAGNOSIS — Z89422 Acquired absence of other left toe(s): Secondary | ICD-10-CM | POA: Diagnosis not present

## 2020-07-16 DIAGNOSIS — Z6824 Body mass index (BMI) 24.0-24.9, adult: Secondary | ICD-10-CM | POA: Diagnosis not present

## 2020-07-16 DIAGNOSIS — M86172 Other acute osteomyelitis, left ankle and foot: Secondary | ICD-10-CM | POA: Diagnosis not present

## 2020-07-17 DIAGNOSIS — Z6824 Body mass index (BMI) 24.0-24.9, adult: Secondary | ICD-10-CM | POA: Diagnosis not present

## 2020-07-17 DIAGNOSIS — B9620 Unspecified Escherichia coli [E. coli] as the cause of diseases classified elsewhere: Secondary | ICD-10-CM | POA: Diagnosis not present

## 2020-07-17 DIAGNOSIS — R21 Rash and other nonspecific skin eruption: Secondary | ICD-10-CM | POA: Diagnosis not present

## 2020-07-17 DIAGNOSIS — M86172 Other acute osteomyelitis, left ankle and foot: Secondary | ICD-10-CM | POA: Diagnosis not present

## 2020-07-17 DIAGNOSIS — Z89422 Acquired absence of other left toe(s): Secondary | ICD-10-CM | POA: Diagnosis not present

## 2020-07-17 DIAGNOSIS — S92255A Nondisplaced fracture of navicular [scaphoid] of left foot, initial encounter for closed fracture: Secondary | ICD-10-CM | POA: Diagnosis not present

## 2020-07-17 DIAGNOSIS — M86672 Other chronic osteomyelitis, left ankle and foot: Secondary | ICD-10-CM | POA: Diagnosis not present

## 2020-07-17 DIAGNOSIS — S82832A Other fracture of upper and lower end of left fibula, initial encounter for closed fracture: Secondary | ICD-10-CM | POA: Diagnosis not present

## 2020-07-18 DIAGNOSIS — R21 Rash and other nonspecific skin eruption: Secondary | ICD-10-CM | POA: Diagnosis not present

## 2020-07-18 DIAGNOSIS — Z89422 Acquired absence of other left toe(s): Secondary | ICD-10-CM | POA: Diagnosis not present

## 2020-07-18 DIAGNOSIS — B9620 Unspecified Escherichia coli [E. coli] as the cause of diseases classified elsewhere: Secondary | ICD-10-CM | POA: Diagnosis not present

## 2020-07-18 DIAGNOSIS — Z6824 Body mass index (BMI) 24.0-24.9, adult: Secondary | ICD-10-CM | POA: Diagnosis not present

## 2020-07-18 DIAGNOSIS — M86172 Other acute osteomyelitis, left ankle and foot: Secondary | ICD-10-CM | POA: Diagnosis not present

## 2020-07-19 ENCOUNTER — Other Ambulatory Visit (HOSPITAL_COMMUNITY): Payer: Self-pay | Admitting: Internal Medicine

## 2020-07-19 DIAGNOSIS — T84623A Infection and inflammatory reaction due to internal fixation device of left tibia, initial encounter: Secondary | ICD-10-CM | POA: Diagnosis not present

## 2020-07-19 DIAGNOSIS — T80211A Bloodstream infection due to central venous catheter, initial encounter: Secondary | ICD-10-CM | POA: Diagnosis not present

## 2020-07-19 DIAGNOSIS — S72302F Unspecified fracture of shaft of left femur, subsequent encounter for open fracture type IIIA, IIIB, or IIIC with routine healing: Secondary | ICD-10-CM | POA: Diagnosis not present

## 2020-07-19 DIAGNOSIS — Z452 Encounter for adjustment and management of vascular access device: Secondary | ICD-10-CM | POA: Diagnosis not present

## 2020-07-19 DIAGNOSIS — R509 Fever, unspecified: Secondary | ICD-10-CM | POA: Diagnosis not present

## 2020-07-19 DIAGNOSIS — M86172 Other acute osteomyelitis, left ankle and foot: Secondary | ICD-10-CM | POA: Diagnosis not present

## 2020-07-19 DIAGNOSIS — Z792 Long term (current) use of antibiotics: Secondary | ICD-10-CM | POA: Diagnosis not present

## 2020-07-19 DIAGNOSIS — M869 Osteomyelitis, unspecified: Secondary | ICD-10-CM | POA: Diagnosis not present

## 2020-07-19 MED FILL — ONDANSETRON HCL 4 MG TABLET: 4 | 7 days supply | Qty: 21 | Fill #0

## 2020-07-20 DIAGNOSIS — T84623D Infection and inflammatory reaction due to internal fixation device of left tibia, subsequent encounter: Secondary | ICD-10-CM | POA: Diagnosis not present

## 2020-07-20 DIAGNOSIS — S93325D Dislocation of tarsometatarsal joint of left foot, subsequent encounter: Secondary | ICD-10-CM | POA: Diagnosis not present

## 2020-07-20 DIAGNOSIS — Z79891 Long term (current) use of opiate analgesic: Secondary | ICD-10-CM | POA: Diagnosis not present

## 2020-07-20 DIAGNOSIS — T829XXA Unspecified complication of cardiac and vascular prosthetic device, implant and graft, initial encounter: Secondary | ICD-10-CM | POA: Diagnosis not present

## 2020-07-20 DIAGNOSIS — I96 Gangrene, not elsewhere classified: Secondary | ICD-10-CM | POA: Diagnosis not present

## 2020-07-20 DIAGNOSIS — T8489XD Other specified complication of internal orthopedic prosthetic devices, implants and grafts, subsequent encounter: Secondary | ICD-10-CM | POA: Diagnosis not present

## 2020-07-20 DIAGNOSIS — R2689 Other abnormalities of gait and mobility: Secondary | ICD-10-CM | POA: Diagnosis not present

## 2020-07-21 DIAGNOSIS — T8189XA Other complications of procedures, not elsewhere classified, initial encounter: Secondary | ICD-10-CM | POA: Diagnosis not present

## 2020-07-21 DIAGNOSIS — I96 Gangrene, not elsewhere classified: Secondary | ICD-10-CM | POA: Diagnosis not present

## 2020-07-21 DIAGNOSIS — R2689 Other abnormalities of gait and mobility: Secondary | ICD-10-CM | POA: Diagnosis not present

## 2020-07-21 DIAGNOSIS — T829XXA Unspecified complication of cardiac and vascular prosthetic device, implant and graft, initial encounter: Secondary | ICD-10-CM | POA: Diagnosis not present

## 2020-07-22 DIAGNOSIS — T829XXA Unspecified complication of cardiac and vascular prosthetic device, implant and graft, initial encounter: Secondary | ICD-10-CM | POA: Diagnosis not present

## 2020-07-22 DIAGNOSIS — R2689 Other abnormalities of gait and mobility: Secondary | ICD-10-CM | POA: Diagnosis not present

## 2020-07-22 DIAGNOSIS — I96 Gangrene, not elsewhere classified: Secondary | ICD-10-CM | POA: Diagnosis not present

## 2020-07-23 DIAGNOSIS — S93325D Dislocation of tarsometatarsal joint of left foot, subsequent encounter: Secondary | ICD-10-CM | POA: Diagnosis not present

## 2020-07-23 DIAGNOSIS — T84623D Infection and inflammatory reaction due to internal fixation device of left tibia, subsequent encounter: Secondary | ICD-10-CM | POA: Diagnosis not present

## 2020-07-23 DIAGNOSIS — Z79891 Long term (current) use of opiate analgesic: Secondary | ICD-10-CM | POA: Diagnosis not present

## 2020-07-23 DIAGNOSIS — R2689 Other abnormalities of gait and mobility: Secondary | ICD-10-CM | POA: Diagnosis not present

## 2020-07-23 DIAGNOSIS — T829XXA Unspecified complication of cardiac and vascular prosthetic device, implant and graft, initial encounter: Secondary | ICD-10-CM | POA: Diagnosis not present

## 2020-07-23 DIAGNOSIS — T8489XD Other specified complication of internal orthopedic prosthetic devices, implants and grafts, subsequent encounter: Secondary | ICD-10-CM | POA: Diagnosis not present

## 2020-07-23 DIAGNOSIS — I96 Gangrene, not elsewhere classified: Secondary | ICD-10-CM | POA: Diagnosis not present

## 2020-07-24 DIAGNOSIS — I96 Gangrene, not elsewhere classified: Secondary | ICD-10-CM | POA: Diagnosis not present

## 2020-07-24 DIAGNOSIS — R2689 Other abnormalities of gait and mobility: Secondary | ICD-10-CM | POA: Diagnosis not present

## 2020-07-24 DIAGNOSIS — T829XXA Unspecified complication of cardiac and vascular prosthetic device, implant and graft, initial encounter: Secondary | ICD-10-CM | POA: Diagnosis not present

## 2020-07-25 DIAGNOSIS — R2689 Other abnormalities of gait and mobility: Secondary | ICD-10-CM | POA: Diagnosis not present

## 2020-07-25 DIAGNOSIS — T829XXA Unspecified complication of cardiac and vascular prosthetic device, implant and graft, initial encounter: Secondary | ICD-10-CM | POA: Diagnosis not present

## 2020-07-25 DIAGNOSIS — I96 Gangrene, not elsewhere classified: Secondary | ICD-10-CM | POA: Diagnosis not present

## 2020-07-26 ENCOUNTER — Other Ambulatory Visit (HOSPITAL_COMMUNITY)
Admission: RE | Admit: 2020-07-26 | Discharge: 2020-07-26 | Disposition: A | Discharge status: Home / Self Care | Payer: 59 | Source: Other Acute Inpatient Hospital | Attending: Emergency Medicine | Admitting: Emergency Medicine

## 2020-07-26 DIAGNOSIS — T829XXA Unspecified complication of cardiac and vascular prosthetic device, implant and graft, initial encounter: Secondary | ICD-10-CM | POA: Diagnosis not present

## 2020-07-26 DIAGNOSIS — M869 Osteomyelitis, unspecified: Secondary | ICD-10-CM | POA: Insufficient documentation

## 2020-07-26 DIAGNOSIS — Z79891 Long term (current) use of opiate analgesic: Secondary | ICD-10-CM | POA: Diagnosis not present

## 2020-07-26 DIAGNOSIS — T84623D Infection and inflammatory reaction due to internal fixation device of left tibia, subsequent encounter: Secondary | ICD-10-CM | POA: Diagnosis not present

## 2020-07-26 DIAGNOSIS — T8489XD Other specified complication of internal orthopedic prosthetic devices, implants and grafts, subsequent encounter: Secondary | ICD-10-CM | POA: Diagnosis not present

## 2020-07-26 DIAGNOSIS — S93325D Dislocation of tarsometatarsal joint of left foot, subsequent encounter: Secondary | ICD-10-CM | POA: Diagnosis not present

## 2020-07-26 DIAGNOSIS — I96 Gangrene, not elsewhere classified: Secondary | ICD-10-CM | POA: Diagnosis not present

## 2020-07-26 DIAGNOSIS — R2689 Other abnormalities of gait and mobility: Secondary | ICD-10-CM | POA: Diagnosis not present

## 2020-07-26 LAB — COMPREHENSIVE METABOLIC PANEL
ALT: 11 U/L (ref 0–44)
AST: 17 U/L (ref 15–41)
Albumin: 3.6 g/dL (ref 3.5–5.0)
Alkaline Phosphatase: 56 U/L (ref 38–126)
Anion gap: 11 (ref 5–15)
BUN: 6 mg/dL (ref 6–20)
CO2: 24 mmol/L (ref 22–32)
Calcium: 8.9 mg/dL (ref 8.9–10.3)
Chloride: 103 mmol/L (ref 98–111)
Creatinine, Ser: 0.8 mg/dL (ref 0.61–1.24)
GFR, Estimated: >60 mL/min (ref >60)
Glucose, Bld: 92 mg/dL (ref 70–99)
Potassium: 3.4 mmol/L — ABNORMAL LOW (ref 3.5–5.1)
Sodium: 138 mmol/L (ref 135–145)
Total Bilirubin: 0.6 mg/dL (ref 0.3–1.2)
Total Protein: 6.7 g/dL (ref 6.5–8.1)

## 2020-07-26 LAB — CBC WITH DIFFERENTIAL/PLATELET
Abs Immature Granulocytes: 0.02 10*3/uL (ref 0.00–0.07)
Basophils Absolute: 0 10*3/uL (ref 0.0–0.1)
Basophils Relative: 1 %
Eosinophils Absolute: 0.3 10*3/uL (ref 0.0–0.5)
Eosinophils Relative: 5 %
HCT: 36.8 % — ABNORMAL LOW (ref 39.0–52.0)
Hemoglobin: 11.1 g/dL — ABNORMAL LOW (ref 13.0–17.0)
Immature Granulocytes: 0 %
Lymphocytes Relative: 23 %
Lymphs Abs: 1.5 10*3/uL (ref 0.7–4.0)
MCH: 23.3 pg — ABNORMAL LOW (ref 26.0–34.0)
MCHC: 30.2 g/dL (ref 30.0–36.0)
MCV: 77.3 fL — ABNORMAL LOW (ref 80.0–100.0)
Monocytes Absolute: 0.6 10*3/uL (ref 0.1–1.0)
Monocytes Relative: 9 %
Neutro Abs: 4.1 10*3/uL (ref 1.7–7.7)
Neutrophils Relative %: 62 %
Platelets: 386 10*3/uL (ref 150–400)
RBC: 4.76 MIL/uL (ref 4.22–5.81)
RDW: 17.4 % — ABNORMAL HIGH (ref 11.5–15.5)
WBC: 6.5 10*3/uL (ref 4.0–10.5)
nRBC: 0 % (ref 0.0–0.2)

## 2020-07-26 LAB — CK: Total CK: 24 U/L — ABNORMAL LOW (ref 49–397)

## 2020-07-26 LAB — C-REACTIVE PROTEIN: CRP: 0.7 mg/dL (ref <1.0)

## 2020-07-27 DIAGNOSIS — T829XXA Unspecified complication of cardiac and vascular prosthetic device, implant and graft, initial encounter: Secondary | ICD-10-CM | POA: Diagnosis not present

## 2020-07-27 DIAGNOSIS — I96 Gangrene, not elsewhere classified: Secondary | ICD-10-CM | POA: Diagnosis not present

## 2020-07-27 DIAGNOSIS — R2689 Other abnormalities of gait and mobility: Secondary | ICD-10-CM | POA: Diagnosis not present

## 2020-07-28 DIAGNOSIS — T829XXA Unspecified complication of cardiac and vascular prosthetic device, implant and graft, initial encounter: Secondary | ICD-10-CM | POA: Diagnosis not present

## 2020-07-28 DIAGNOSIS — Z79891 Long term (current) use of opiate analgesic: Secondary | ICD-10-CM | POA: Diagnosis not present

## 2020-07-28 DIAGNOSIS — I96 Gangrene, not elsewhere classified: Secondary | ICD-10-CM | POA: Diagnosis not present

## 2020-07-28 DIAGNOSIS — R2689 Other abnormalities of gait and mobility: Secondary | ICD-10-CM | POA: Diagnosis not present

## 2020-07-28 DIAGNOSIS — M86172 Other acute osteomyelitis, left ankle and foot: Secondary | ICD-10-CM | POA: Diagnosis not present

## 2020-07-28 DIAGNOSIS — T8489XD Other specified complication of internal orthopedic prosthetic devices, implants and grafts, subsequent encounter: Secondary | ICD-10-CM | POA: Diagnosis not present

## 2020-07-28 DIAGNOSIS — T84623D Infection and inflammatory reaction due to internal fixation device of left tibia, subsequent encounter: Secondary | ICD-10-CM | POA: Diagnosis not present

## 2020-07-28 DIAGNOSIS — S93325D Dislocation of tarsometatarsal joint of left foot, subsequent encounter: Secondary | ICD-10-CM | POA: Diagnosis not present

## 2020-07-29 DIAGNOSIS — I96 Gangrene, not elsewhere classified: Secondary | ICD-10-CM | POA: Diagnosis not present

## 2020-07-29 DIAGNOSIS — T829XXA Unspecified complication of cardiac and vascular prosthetic device, implant and graft, initial encounter: Secondary | ICD-10-CM | POA: Diagnosis not present

## 2020-07-29 DIAGNOSIS — T8189XA Other complications of procedures, not elsewhere classified, initial encounter: Secondary | ICD-10-CM | POA: Diagnosis not present

## 2020-07-29 DIAGNOSIS — R2689 Other abnormalities of gait and mobility: Secondary | ICD-10-CM | POA: Diagnosis not present

## 2020-07-30 DIAGNOSIS — I96 Gangrene, not elsewhere classified: Secondary | ICD-10-CM | POA: Diagnosis not present

## 2020-07-30 DIAGNOSIS — T8489XD Other specified complication of internal orthopedic prosthetic devices, implants and grafts, subsequent encounter: Secondary | ICD-10-CM | POA: Diagnosis not present

## 2020-07-30 DIAGNOSIS — T829XXA Unspecified complication of cardiac and vascular prosthetic device, implant and graft, initial encounter: Secondary | ICD-10-CM | POA: Diagnosis not present

## 2020-07-30 DIAGNOSIS — S93325D Dislocation of tarsometatarsal joint of left foot, subsequent encounter: Secondary | ICD-10-CM | POA: Diagnosis not present

## 2020-07-30 DIAGNOSIS — R2689 Other abnormalities of gait and mobility: Secondary | ICD-10-CM | POA: Diagnosis not present

## 2020-07-30 DIAGNOSIS — Z79891 Long term (current) use of opiate analgesic: Secondary | ICD-10-CM | POA: Diagnosis not present

## 2020-07-30 DIAGNOSIS — T84623D Infection and inflammatory reaction due to internal fixation device of left tibia, subsequent encounter: Secondary | ICD-10-CM | POA: Diagnosis not present

## 2020-07-31 DIAGNOSIS — T829XXA Unspecified complication of cardiac and vascular prosthetic device, implant and graft, initial encounter: Secondary | ICD-10-CM | POA: Diagnosis not present

## 2020-07-31 DIAGNOSIS — R2689 Other abnormalities of gait and mobility: Secondary | ICD-10-CM | POA: Diagnosis not present

## 2020-07-31 DIAGNOSIS — I96 Gangrene, not elsewhere classified: Secondary | ICD-10-CM | POA: Diagnosis not present

## 2020-08-01 DIAGNOSIS — T829XXA Unspecified complication of cardiac and vascular prosthetic device, implant and graft, initial encounter: Secondary | ICD-10-CM | POA: Diagnosis not present

## 2020-08-01 DIAGNOSIS — R2689 Other abnormalities of gait and mobility: Secondary | ICD-10-CM | POA: Diagnosis not present

## 2020-08-01 DIAGNOSIS — I96 Gangrene, not elsewhere classified: Secondary | ICD-10-CM | POA: Diagnosis not present

## 2020-08-02 ENCOUNTER — Other Ambulatory Visit: Payer: Self-pay | Admitting: *Deleted

## 2020-08-02 ENCOUNTER — Other Ambulatory Visit (HOSPITAL_COMMUNITY)
Admission: RE | Admit: 2020-08-02 | Discharge: 2020-08-02 | Disposition: A | Discharge status: Home / Self Care | Payer: 59 | Source: Other Acute Inpatient Hospital | Attending: Internal Medicine | Admitting: Internal Medicine

## 2020-08-02 DIAGNOSIS — M869 Osteomyelitis, unspecified: Secondary | ICD-10-CM | POA: Insufficient documentation

## 2020-08-02 DIAGNOSIS — T8489XD Other specified complication of internal orthopedic prosthetic devices, implants and grafts, subsequent encounter: Secondary | ICD-10-CM | POA: Diagnosis not present

## 2020-08-02 DIAGNOSIS — T829XXA Unspecified complication of cardiac and vascular prosthetic device, implant and graft, initial encounter: Secondary | ICD-10-CM | POA: Diagnosis not present

## 2020-08-02 DIAGNOSIS — I96 Gangrene, not elsewhere classified: Secondary | ICD-10-CM | POA: Diagnosis not present

## 2020-08-02 DIAGNOSIS — Z79891 Long term (current) use of opiate analgesic: Secondary | ICD-10-CM | POA: Diagnosis not present

## 2020-08-02 DIAGNOSIS — T84623D Infection and inflammatory reaction due to internal fixation device of left tibia, subsequent encounter: Secondary | ICD-10-CM | POA: Diagnosis not present

## 2020-08-02 DIAGNOSIS — S93325D Dislocation of tarsometatarsal joint of left foot, subsequent encounter: Secondary | ICD-10-CM | POA: Diagnosis not present

## 2020-08-02 DIAGNOSIS — R2689 Other abnormalities of gait and mobility: Secondary | ICD-10-CM | POA: Diagnosis not present

## 2020-08-02 LAB — CK: Total CK: 30 U/L — ABNORMAL LOW (ref 49–397)

## 2020-08-02 LAB — CBC WITH DIFFERENTIAL/PLATELET
Abs Immature Granulocytes: 0.01 10*3/uL (ref 0.00–0.07)
Basophils Absolute: 0 10*3/uL (ref 0.0–0.1)
Basophils Relative: 1 %
Eosinophils Absolute: 0.3 10*3/uL (ref 0.0–0.5)
Eosinophils Relative: 7 %
HCT: 36.8 % — ABNORMAL LOW (ref 39.0–52.0)
Hemoglobin: 11.2 g/dL — ABNORMAL LOW (ref 13.0–17.0)
Immature Granulocytes: 0 %
Lymphocytes Relative: 29 %
Lymphs Abs: 1.5 10*3/uL (ref 0.7–4.0)
MCH: 23.5 pg — ABNORMAL LOW (ref 26.0–34.0)
MCHC: 30.4 g/dL (ref 30.0–36.0)
MCV: 77.1 fL — ABNORMAL LOW (ref 80.0–100.0)
Monocytes Absolute: 0.6 10*3/uL (ref 0.1–1.0)
Monocytes Relative: 11 %
Neutro Abs: 2.7 10*3/uL (ref 1.7–7.7)
Neutrophils Relative %: 52 %
Platelets: 199 10*3/uL (ref 150–400)
RBC: 4.77 MIL/uL (ref 4.22–5.81)
RDW: 18.2 % — ABNORMAL HIGH (ref 11.5–15.5)
WBC: 5.1 10*3/uL (ref 4.0–10.5)
nRBC: 0 % (ref 0.0–0.2)

## 2020-08-02 LAB — COMPREHENSIVE METABOLIC PANEL
ALT: 12 U/L (ref 0–44)
AST: 18 U/L (ref 15–41)
Albumin: 3.6 g/dL (ref 3.5–5.0)
Alkaline Phosphatase: 51 U/L (ref 38–126)
Anion gap: 10 (ref 5–15)
BUN: 5 mg/dL — ABNORMAL LOW (ref 6–20)
CO2: 25 mmol/L (ref 22–32)
Calcium: 8.9 mg/dL (ref 8.9–10.3)
Chloride: 104 mmol/L (ref 98–111)
Creatinine, Ser: 0.76 mg/dL (ref 0.61–1.24)
GFR, Estimated: >60 mL/min (ref >60)
Glucose, Bld: 95 mg/dL (ref 70–99)
Potassium: 3.5 mmol/L (ref 3.5–5.1)
Sodium: 139 mmol/L (ref 135–145)
Total Bilirubin: 0.4 mg/dL (ref 0.3–1.2)
Total Protein: 6.4 g/dL — ABNORMAL LOW (ref 6.5–8.1)

## 2020-08-02 LAB — C-REACTIVE PROTEIN: CRP: 0.6 mg/dL (ref <1.0)
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
## 2020-08-03 DIAGNOSIS — S42332D Displaced oblique fracture of shaft of humerus, left arm, subsequent encounter for fracture with routine healing: Secondary | ICD-10-CM | POA: Diagnosis not present

## 2020-08-03 DIAGNOSIS — S82262D Displaced segmental fracture of shaft of left tibia, subsequent encounter for closed fracture with routine healing: Secondary | ICD-10-CM | POA: Diagnosis not present

## 2020-08-03 DIAGNOSIS — S72352D Displaced comminuted fracture of shaft of left femur, subsequent encounter for closed fracture with routine healing: Secondary | ICD-10-CM | POA: Diagnosis not present

## 2020-08-03 DIAGNOSIS — S82202D Unspecified fracture of shaft of left tibia, subsequent encounter for closed fracture with routine healing: Secondary | ICD-10-CM | POA: Diagnosis not present

## 2020-08-03 DIAGNOSIS — S82402D Unspecified fracture of shaft of left fibula, subsequent encounter for closed fracture with routine healing: Secondary | ICD-10-CM | POA: Diagnosis not present

## 2020-08-03 DIAGNOSIS — R2689 Other abnormalities of gait and mobility: Secondary | ICD-10-CM | POA: Diagnosis not present

## 2020-08-03 DIAGNOSIS — T829XXA Unspecified complication of cardiac and vascular prosthetic device, implant and graft, initial encounter: Secondary | ICD-10-CM | POA: Diagnosis not present

## 2020-08-03 DIAGNOSIS — S52002D Unspecified fracture of upper end of left ulna, subsequent encounter for closed fracture with routine healing: Secondary | ICD-10-CM | POA: Diagnosis not present

## 2020-08-03 DIAGNOSIS — I96 Gangrene, not elsewhere classified: Secondary | ICD-10-CM | POA: Diagnosis not present

## 2020-08-04 DIAGNOSIS — I96 Gangrene, not elsewhere classified: Secondary | ICD-10-CM | POA: Diagnosis not present

## 2020-08-04 DIAGNOSIS — R2689 Other abnormalities of gait and mobility: Secondary | ICD-10-CM | POA: Diagnosis not present

## 2020-08-04 DIAGNOSIS — T829XXA Unspecified complication of cardiac and vascular prosthetic device, implant and graft, initial encounter: Secondary | ICD-10-CM | POA: Diagnosis not present

## 2020-08-05 ENCOUNTER — Other Ambulatory Visit: Payer: Self-pay | Admitting: *Deleted

## 2020-08-05 ENCOUNTER — Encounter: Payer: Self-pay | Admitting: *Deleted

## 2020-08-05 DIAGNOSIS — T829XXA Unspecified complication of cardiac and vascular prosthetic device, implant and graft, initial encounter: Secondary | ICD-10-CM | POA: Diagnosis not present

## 2020-08-05 DIAGNOSIS — I96 Gangrene, not elsewhere classified: Secondary | ICD-10-CM | POA: Diagnosis not present

## 2020-08-05 DIAGNOSIS — R2689 Other abnormalities of gait and mobility: Secondary | ICD-10-CM | POA: Diagnosis not present
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
## 2020-08-06 DIAGNOSIS — T84623D Infection and inflammatory reaction due to internal fixation device of left tibia, subsequent encounter: Secondary | ICD-10-CM | POA: Diagnosis not present

## 2020-08-06 DIAGNOSIS — T829XXA Unspecified complication of cardiac and vascular prosthetic device, implant and graft, initial encounter: Secondary | ICD-10-CM | POA: Diagnosis not present

## 2020-08-06 DIAGNOSIS — I96 Gangrene, not elsewhere classified: Secondary | ICD-10-CM | POA: Diagnosis not present

## 2020-08-06 DIAGNOSIS — T8489XD Other specified complication of internal orthopedic prosthetic devices, implants and grafts, subsequent encounter: Secondary | ICD-10-CM | POA: Diagnosis not present

## 2020-08-06 DIAGNOSIS — R2689 Other abnormalities of gait and mobility: Secondary | ICD-10-CM | POA: Diagnosis not present

## 2020-08-06 DIAGNOSIS — S93325D Dislocation of tarsometatarsal joint of left foot, subsequent encounter: Secondary | ICD-10-CM | POA: Diagnosis not present

## 2020-08-06 DIAGNOSIS — Z79891 Long term (current) use of opiate analgesic: Secondary | ICD-10-CM | POA: Diagnosis not present

## 2020-08-07 DIAGNOSIS — T829XXA Unspecified complication of cardiac and vascular prosthetic device, implant and graft, initial encounter: Secondary | ICD-10-CM | POA: Diagnosis not present

## 2020-08-07 DIAGNOSIS — R2689 Other abnormalities of gait and mobility: Secondary | ICD-10-CM | POA: Diagnosis not present

## 2020-08-07 DIAGNOSIS — I96 Gangrene, not elsewhere classified: Secondary | ICD-10-CM | POA: Diagnosis not present

## 2020-08-08 DIAGNOSIS — R2689 Other abnormalities of gait and mobility: Secondary | ICD-10-CM | POA: Diagnosis not present

## 2020-08-08 DIAGNOSIS — I96 Gangrene, not elsewhere classified: Secondary | ICD-10-CM | POA: Diagnosis not present

## 2020-08-08 DIAGNOSIS — T829XXA Unspecified complication of cardiac and vascular prosthetic device, implant and graft, initial encounter: Secondary | ICD-10-CM | POA: Diagnosis not present

## 2020-08-09 ENCOUNTER — Encounter (HOSPITAL_COMMUNITY)
Admission: RE | Admit: 2020-08-09 | Discharge: 2020-08-09 | Disposition: A | Discharge status: Home / Self Care | Payer: 59 | Source: Skilled Nursing Facility | Attending: Internal Medicine | Admitting: Internal Medicine

## 2020-08-09 DIAGNOSIS — M869 Osteomyelitis, unspecified: Secondary | ICD-10-CM | POA: Insufficient documentation

## 2020-08-09 DIAGNOSIS — T84623A Infection and inflammatory reaction due to internal fixation device of left tibia, initial encounter: Secondary | ICD-10-CM | POA: Diagnosis not present

## 2020-08-09 DIAGNOSIS — B9689 Other specified bacterial agents as the cause of diseases classified elsewhere: Secondary | ICD-10-CM | POA: Diagnosis not present

## 2020-08-09 DIAGNOSIS — T8744 Infection of amputation stump, left lower extremity: Secondary | ICD-10-CM | POA: Diagnosis not present

## 2020-08-09 DIAGNOSIS — S93325D Dislocation of tarsometatarsal joint of left foot, subsequent encounter: Secondary | ICD-10-CM | POA: Diagnosis not present

## 2020-08-09 DIAGNOSIS — R2689 Other abnormalities of gait and mobility: Secondary | ICD-10-CM | POA: Diagnosis not present

## 2020-08-09 DIAGNOSIS — I96 Gangrene, not elsewhere classified: Secondary | ICD-10-CM | POA: Diagnosis not present

## 2020-08-09 DIAGNOSIS — T829XXA Unspecified complication of cardiac and vascular prosthetic device, implant and graft, initial encounter: Secondary | ICD-10-CM | POA: Diagnosis not present

## 2020-08-09 DIAGNOSIS — S82202E Unspecified fracture of shaft of left tibia, subsequent encounter for open fracture type I or II with routine healing: Secondary | ICD-10-CM | POA: Diagnosis not present

## 2020-08-09 DIAGNOSIS — B9620 Unspecified Escherichia coli [E. coli] as the cause of diseases classified elsewhere: Secondary | ICD-10-CM | POA: Diagnosis not present

## 2020-08-09 DIAGNOSIS — S72302F Unspecified fracture of shaft of left femur, subsequent encounter for open fracture type IIIA, IIIB, or IIIC with routine healing: Secondary | ICD-10-CM | POA: Diagnosis not present

## 2020-08-09 DIAGNOSIS — T8789 Other complications of amputation stump: Secondary | ICD-10-CM | POA: Diagnosis not present

## 2020-08-09 LAB — CBC WITH DIFFERENTIAL/PLATELET
Abs Immature Granulocytes: 0.01 10*3/uL (ref 0.00–0.07)
Basophils Absolute: 0.1 10*3/uL (ref 0.0–0.1)
Basophils Relative: 1 %
Eosinophils Absolute: 0.3 10*3/uL (ref 0.0–0.5)
Eosinophils Relative: 5 %
HCT: 39 % (ref 39.0–52.0)
Hemoglobin: 11.8 g/dL — ABNORMAL LOW (ref 13.0–17.0)
Immature Granulocytes: 0 %
Lymphocytes Relative: 27 %
Lymphs Abs: 1.4 10*3/uL (ref 0.7–4.0)
MCH: 23.1 pg — ABNORMAL LOW (ref 26.0–34.0)
MCHC: 30.3 g/dL (ref 30.0–36.0)
MCV: 76.3 fL — ABNORMAL LOW (ref 80.0–100.0)
Monocytes Absolute: 0.5 10*3/uL (ref 0.1–1.0)
Monocytes Relative: 11 %
Neutro Abs: 2.7 10*3/uL (ref 1.7–7.7)
Neutrophils Relative %: 56 %
Platelets: 256 10*3/uL (ref 150–400)
RBC: 5.11 MIL/uL (ref 4.22–5.81)
RDW: 18.1 % — ABNORMAL HIGH (ref 11.5–15.5)
WBC: 5 10*3/uL (ref 4.0–10.5)
nRBC: 0 % (ref 0.0–0.2)

## 2020-08-09 LAB — COMPREHENSIVE METABOLIC PANEL
ALT: 12 U/L (ref 0–44)
AST: 19 U/L (ref 15–41)
Albumin: 3.8 g/dL (ref 3.5–5.0)
Alkaline Phosphatase: 57 U/L (ref 38–126)
Anion gap: 9 (ref 5–15)
BUN: 8 mg/dL (ref 6–20)
CO2: 24 mmol/L (ref 22–32)
Calcium: 9.2 mg/dL (ref 8.9–10.3)
Chloride: 101 mmol/L (ref 98–111)
Creatinine, Ser: 0.79 mg/dL (ref 0.61–1.24)
GFR, Estimated: >60 mL/min (ref >60)
Glucose, Bld: 102 mg/dL — ABNORMAL HIGH (ref 70–99)
Potassium: 3.9 mmol/L (ref 3.5–5.1)
Sodium: 134 mmol/L — ABNORMAL LOW (ref 135–145)
Total Bilirubin: 0.3 mg/dL (ref 0.3–1.2)
Total Protein: 6.5 g/dL (ref 6.5–8.1)

## 2020-08-09 LAB — C-REACTIVE PROTEIN: CRP: 0.6 mg/dL (ref <1.0)

## 2020-08-09 LAB — CK: Total CK: 23 U/L — ABNORMAL LOW (ref 49–397)

## 2020-08-10 DIAGNOSIS — R2689 Other abnormalities of gait and mobility: Secondary | ICD-10-CM | POA: Diagnosis not present

## 2020-08-10 DIAGNOSIS — T829XXA Unspecified complication of cardiac and vascular prosthetic device, implant and graft, initial encounter: Secondary | ICD-10-CM | POA: Diagnosis not present

## 2020-08-10 DIAGNOSIS — I96 Gangrene, not elsewhere classified: Secondary | ICD-10-CM | POA: Diagnosis not present

## 2020-08-11 DIAGNOSIS — I96 Gangrene, not elsewhere classified: Secondary | ICD-10-CM | POA: Diagnosis not present

## 2020-08-11 DIAGNOSIS — R2689 Other abnormalities of gait and mobility: Secondary | ICD-10-CM | POA: Diagnosis not present

## 2020-08-11 DIAGNOSIS — T829XXA Unspecified complication of cardiac and vascular prosthetic device, implant and graft, initial encounter: Secondary | ICD-10-CM | POA: Diagnosis not present

## 2020-08-12 DIAGNOSIS — Z452 Encounter for adjustment and management of vascular access device: Secondary | ICD-10-CM | POA: Diagnosis not present

## 2020-08-12 DIAGNOSIS — T829XXA Unspecified complication of cardiac and vascular prosthetic device, implant and graft, initial encounter: Secondary | ICD-10-CM | POA: Diagnosis not present

## 2020-08-12 DIAGNOSIS — Z6824 Body mass index (BMI) 24.0-24.9, adult: Secondary | ICD-10-CM | POA: Diagnosis not present

## 2020-08-12 DIAGNOSIS — I96 Gangrene, not elsewhere classified: Secondary | ICD-10-CM | POA: Diagnosis not present

## 2020-08-12 DIAGNOSIS — M86172 Other acute osteomyelitis, left ankle and foot: Secondary | ICD-10-CM | POA: Diagnosis not present

## 2020-08-12 DIAGNOSIS — R2689 Other abnormalities of gait and mobility: Secondary | ICD-10-CM | POA: Diagnosis not present

## 2020-08-13 DIAGNOSIS — R2689 Other abnormalities of gait and mobility: Secondary | ICD-10-CM | POA: Diagnosis not present

## 2020-08-13 DIAGNOSIS — I96 Gangrene, not elsewhere classified: Secondary | ICD-10-CM | POA: Diagnosis not present

## 2020-08-13 DIAGNOSIS — T829XXA Unspecified complication of cardiac and vascular prosthetic device, implant and graft, initial encounter: Secondary | ICD-10-CM | POA: Diagnosis not present

## 2020-08-21 DIAGNOSIS — S72302F Unspecified fracture of shaft of left femur, subsequent encounter for open fracture type IIIA, IIIB, or IIIC with routine healing: Secondary | ICD-10-CM | POA: Diagnosis not present

## 2020-08-21 DIAGNOSIS — T8189XA Other complications of procedures, not elsewhere classified, initial encounter: Secondary | ICD-10-CM | POA: Diagnosis not present

## 2020-08-21 DIAGNOSIS — T8789 Other complications of amputation stump: Secondary | ICD-10-CM | POA: Diagnosis not present

## 2020-08-21 DIAGNOSIS — B9620 Unspecified Escherichia coli [E. coli] as the cause of diseases classified elsewhere: Secondary | ICD-10-CM | POA: Diagnosis not present

## 2020-08-21 DIAGNOSIS — M869 Osteomyelitis, unspecified: Secondary | ICD-10-CM | POA: Diagnosis not present

## 2020-08-21 DIAGNOSIS — B9689 Other specified bacterial agents as the cause of diseases classified elsewhere: Secondary | ICD-10-CM | POA: Diagnosis not present

## 2020-08-21 DIAGNOSIS — S93325D Dislocation of tarsometatarsal joint of left foot, subsequent encounter: Secondary | ICD-10-CM | POA: Diagnosis not present

## 2020-08-21 DIAGNOSIS — T84623A Infection and inflammatory reaction due to internal fixation device of left tibia, initial encounter: Secondary | ICD-10-CM | POA: Diagnosis not present

## 2020-08-21 DIAGNOSIS — T8744 Infection of amputation stump, left lower extremity: Secondary | ICD-10-CM | POA: Diagnosis not present

## 2020-08-21 DIAGNOSIS — S82202E Unspecified fracture of shaft of left tibia, subsequent encounter for open fracture type I or II with routine healing: Secondary | ICD-10-CM | POA: Diagnosis not present

## 2020-08-27 DIAGNOSIS — S72302F Unspecified fracture of shaft of left femur, subsequent encounter for open fracture type IIIA, IIIB, or IIIC with routine healing: Secondary | ICD-10-CM | POA: Diagnosis not present

## 2020-08-27 DIAGNOSIS — B9689 Other specified bacterial agents as the cause of diseases classified elsewhere: Secondary | ICD-10-CM | POA: Diagnosis not present

## 2020-08-27 DIAGNOSIS — S93325D Dislocation of tarsometatarsal joint of left foot, subsequent encounter: Secondary | ICD-10-CM | POA: Diagnosis not present

## 2020-08-27 DIAGNOSIS — T84623A Infection and inflammatory reaction due to internal fixation device of left tibia, initial encounter: Secondary | ICD-10-CM | POA: Diagnosis not present

## 2020-08-27 DIAGNOSIS — S82202E Unspecified fracture of shaft of left tibia, subsequent encounter for open fracture type I or II with routine healing: Secondary | ICD-10-CM | POA: Diagnosis not present

## 2020-08-27 DIAGNOSIS — B9620 Unspecified Escherichia coli [E. coli] as the cause of diseases classified elsewhere: Secondary | ICD-10-CM | POA: Diagnosis not present

## 2020-08-27 DIAGNOSIS — T8744 Infection of amputation stump, left lower extremity: Secondary | ICD-10-CM | POA: Diagnosis not present

## 2020-08-27 DIAGNOSIS — T8789 Other complications of amputation stump: Secondary | ICD-10-CM | POA: Diagnosis not present

## 2020-08-27 DIAGNOSIS — M869 Osteomyelitis, unspecified: Secondary | ICD-10-CM | POA: Diagnosis not present

## 2020-08-31 DIAGNOSIS — S98912D Complete traumatic amputation of left foot, level unspecified, subsequent encounter: Secondary | ICD-10-CM | POA: Diagnosis not present

## 2020-08-31 DIAGNOSIS — S72352F Displaced comminuted fracture of shaft of left femur, subsequent encounter for open fracture type IIIA, IIIB, or IIIC with routine healing: Secondary | ICD-10-CM | POA: Diagnosis not present

## 2020-09-02 DIAGNOSIS — S82262B Displaced segmental fracture of shaft of left tibia, initial encounter for open fracture type I or II: Secondary | ICD-10-CM | POA: Diagnosis not present

## 2020-09-02 DIAGNOSIS — M67874 Other specified disorders of tendon, left ankle and foot: Secondary | ICD-10-CM | POA: Diagnosis not present

## 2020-09-02 DIAGNOSIS — S81819A Laceration without foreign body, unspecified lower leg, initial encounter: Secondary | ICD-10-CM | POA: Diagnosis not present

## 2020-09-02 DIAGNOSIS — S71112D Laceration without foreign body, left thigh, subsequent encounter: Secondary | ICD-10-CM | POA: Diagnosis not present

## 2020-09-02 DIAGNOSIS — S81802A Unspecified open wound, left lower leg, initial encounter: Secondary | ICD-10-CM | POA: Diagnosis not present

## 2020-09-02 DIAGNOSIS — T8189XD Other complications of procedures, not elsewhere classified, subsequent encounter: Secondary | ICD-10-CM | POA: Diagnosis not present

## 2020-09-09 DIAGNOSIS — Z48298 Encounter for aftercare following other organ transplant: Secondary | ICD-10-CM | POA: Diagnosis not present

## 2020-09-09 DIAGNOSIS — T8789 Other complications of amputation stump: Secondary | ICD-10-CM | POA: Diagnosis not present

## 2020-09-09 DIAGNOSIS — S72352F Displaced comminuted fracture of shaft of left femur, subsequent encounter for open fracture type IIIA, IIIB, or IIIC with routine healing: Secondary | ICD-10-CM | POA: Diagnosis not present

## 2020-09-09 DIAGNOSIS — M86172 Other acute osteomyelitis, left ankle and foot: Secondary | ICD-10-CM | POA: Diagnosis not present

## 2020-09-16 DIAGNOSIS — S72352F Displaced comminuted fracture of shaft of left femur, subsequent encounter for open fracture type IIIA, IIIB, or IIIC with routine healing: Secondary | ICD-10-CM | POA: Diagnosis not present

## 2020-09-16 DIAGNOSIS — Z48298 Encounter for aftercare following other organ transplant: Secondary | ICD-10-CM | POA: Diagnosis not present

## 2020-09-16 DIAGNOSIS — T8789 Other complications of amputation stump: Secondary | ICD-10-CM | POA: Diagnosis not present

## 2020-09-21 DIAGNOSIS — M79662 Pain in left lower leg: Secondary | ICD-10-CM | POA: Diagnosis not present

## 2020-09-21 DIAGNOSIS — M545 Low back pain, unspecified: Secondary | ICD-10-CM | POA: Diagnosis not present

## 2020-09-21 DIAGNOSIS — R109 Unspecified abdominal pain: Secondary | ICD-10-CM | POA: Diagnosis not present

## 2020-09-22 ENCOUNTER — Other Ambulatory Visit: Payer: Self-pay

## 2020-09-22 ENCOUNTER — Ambulatory Visit (HOSPITAL_COMMUNITY)
Admission: RE | Admit: 2020-09-22 | Discharge: 2020-09-22 | Disposition: A | Discharge status: Home / Self Care | Payer: 59 | Source: Ambulatory Visit | Attending: Family Medicine | Admitting: Family Medicine

## 2020-09-22 ENCOUNTER — Ambulatory Visit (INDEPENDENT_AMBULATORY_CARE_PROVIDER_SITE_OTHER): Payer: 59 | Admitting: Family Medicine

## 2020-09-22 DIAGNOSIS — I824Z2 Acute embolism and thrombosis of unspecified deep veins of left distal lower extremity: Secondary | ICD-10-CM | POA: Diagnosis not present

## 2020-09-22 DIAGNOSIS — Z89432 Acquired absence of left foot: Secondary | ICD-10-CM | POA: Diagnosis not present

## 2020-09-22 DIAGNOSIS — R509 Fever, unspecified: Secondary | ICD-10-CM

## 2020-09-22 DIAGNOSIS — I82432 Acute embolism and thrombosis of left popliteal vein: Secondary | ICD-10-CM | POA: Diagnosis not present

## 2020-09-22 DIAGNOSIS — I82492 Acute embolism and thrombosis of other specified deep vein of left lower extremity: Secondary | ICD-10-CM | POA: Diagnosis not present

## 2020-09-22 DIAGNOSIS — F1020 Alcohol dependence, uncomplicated: Secondary | ICD-10-CM | POA: Diagnosis not present

## 2020-09-22 DIAGNOSIS — E871 Hypo-osmolality and hyponatremia: Secondary | ICD-10-CM | POA: Diagnosis not present

## 2020-09-22 DIAGNOSIS — R000 Tachycardia, unspecified: Secondary | ICD-10-CM | POA: Diagnosis not present

## 2020-09-22 DIAGNOSIS — J189 Pneumonia, unspecified organism: Secondary | ICD-10-CM | POA: Diagnosis not present

## 2020-09-22 DIAGNOSIS — I2694 Multiple subsegmental pulmonary emboli without acute cor pulmonale: Secondary | ICD-10-CM | POA: Diagnosis not present

## 2020-09-22 DIAGNOSIS — R0781 Pleurodynia: Secondary | ICD-10-CM

## 2020-09-22 DIAGNOSIS — Z881 Allergy status to other antibiotic agents status: Secondary | ICD-10-CM | POA: Diagnosis not present

## 2020-09-22 DIAGNOSIS — Z79899 Other long term (current) drug therapy: Secondary | ICD-10-CM | POA: Diagnosis not present

## 2020-09-22 DIAGNOSIS — Z86711 Personal history of pulmonary embolism: Secondary | ICD-10-CM | POA: Diagnosis not present

## 2020-09-22 DIAGNOSIS — Z20822 Contact with and (suspected) exposure to covid-19: Secondary | ICD-10-CM | POA: Diagnosis not present

## 2020-09-22 DIAGNOSIS — J9811 Atelectasis: Secondary | ICD-10-CM | POA: Diagnosis not present

## 2020-09-22 DIAGNOSIS — I2782 Chronic pulmonary embolism: Secondary | ICD-10-CM | POA: Diagnosis not present

## 2020-09-22 DIAGNOSIS — R079 Chest pain, unspecified: Secondary | ICD-10-CM | POA: Diagnosis not present

## 2020-09-22 DIAGNOSIS — J90 Pleural effusion, not elsewhere classified: Secondary | ICD-10-CM | POA: Diagnosis not present

## 2020-09-22 DIAGNOSIS — E869 Volume depletion, unspecified: Secondary | ICD-10-CM | POA: Diagnosis not present

## 2020-09-22 DIAGNOSIS — Z7901 Long term (current) use of anticoagulants: Secondary | ICD-10-CM | POA: Diagnosis not present

## 2020-09-22 DIAGNOSIS — I82412 Acute embolism and thrombosis of left femoral vein: Secondary | ICD-10-CM | POA: Diagnosis not present

## 2020-09-22 DIAGNOSIS — Z811 Family history of alcohol abuse and dependence: Secondary | ICD-10-CM | POA: Diagnosis not present

## 2020-09-22 DIAGNOSIS — R042 Hemoptysis: Secondary | ICD-10-CM | POA: Diagnosis not present

## 2020-09-22 DIAGNOSIS — Z86718 Personal history of other venous thrombosis and embolism: Secondary | ICD-10-CM | POA: Diagnosis not present

## 2020-09-22 DIAGNOSIS — I2609 Other pulmonary embolism with acute cor pulmonale: Secondary | ICD-10-CM | POA: Diagnosis not present

## 2020-09-22 DIAGNOSIS — I2699 Other pulmonary embolism without acute cor pulmonale: Secondary | ICD-10-CM | POA: Diagnosis not present

## 2020-09-22 DIAGNOSIS — R059 Cough, unspecified: Secondary | ICD-10-CM | POA: Diagnosis not present

## 2020-09-22 MED ORDER — OXYCODONE-ACETAMINOPHEN 5-325 MG PO TABS: 1.0000 | ORAL_TABLET | ORAL | 0 refills | Status: DC | PRN

## 2020-09-22 MED ORDER — OXYCODONE-ACETAMINOPHEN 5-325 MG PO TABS
1.0000 | ORAL_TABLET | ORAL | 0 refills | Status: DC | PRN
Start: 2020-09-22 — End: 2020-09-22

## 2020-09-22 NOTE — Progress Notes (Signed)
   Subjective:    Patient ID: [name], male    DOB: 11/06/1990, 30 y.o.   MRN: [id number]  HPI Pt having left side pain. Had fever off and on. Went to ER yesterday due to fall on 09/20/20. Pt went to ER due to pain and hardly able to breath Patient states he slipped and fell the other day and he has severe pain in his left flank area hurts with certain movements hurts to take a deep breath denies wheezing or difficulty breathing denies sore throat did have fever earlier this week his mom relates that he went to [facility] ER and they evaluated him then let him go without doing any x-rays.  They stated that his foot looked good no sign of infection.  Had recent foot surgery.  Review of Systems    See above Objective:   Physical Exam  Lungs clear respiratory rate normal heart is regular pulse normal flank moderate tenderness extremities no edema skin warm dry      Assessment & Plan:  Rib x-rays negative No sign of pneumonia Covid test taken Oxycodone for severe pain caution drowsiness not for long-term use no driving with the oxycodone if progressive troubles with infection or illness to notify us ASAP

## 2020-09-23 ENCOUNTER — Telehealth: Payer: Self-pay | Admitting: Family Medicine

## 2020-09-23 ENCOUNTER — Ambulatory Visit (INDEPENDENT_AMBULATORY_CARE_PROVIDER_SITE_OTHER): Payer: 59 | Admitting: Family Medicine

## 2020-09-23 ENCOUNTER — Other Ambulatory Visit: Payer: Self-pay

## 2020-09-23 ENCOUNTER — Ambulatory Visit (HOSPITAL_COMMUNITY)
Admission: RE | Admit: 2020-09-23 | Discharge: 2020-09-23 | Disposition: A | Discharge status: Home / Self Care | Payer: 59 | Source: Ambulatory Visit | Attending: Family Medicine | Admitting: Family Medicine

## 2020-09-23 ENCOUNTER — Inpatient Hospital Stay (HOSPITAL_COMMUNITY)
Admission: EM | Admit: 2020-09-23 | Discharge: 2020-09-27 | DRG: 176 | Disposition: A | Discharge status: Home / Self Care | Payer: 59 | Source: Ambulatory Visit | Attending: Internal Medicine | Admitting: Internal Medicine

## 2020-09-23 ENCOUNTER — Encounter (HOSPITAL_COMMUNITY): Payer: Self-pay | Admitting: *Deleted

## 2020-09-23 ENCOUNTER — Other Ambulatory Visit (HOSPITAL_COMMUNITY)
Admission: RE | Admit: 2020-09-23 | Discharge: 2020-09-23 | Disposition: A | Discharge status: Home / Self Care | Payer: 59 | Source: Ambulatory Visit | Attending: Family Medicine | Admitting: Family Medicine

## 2020-09-23 DIAGNOSIS — R509 Fever, unspecified: Secondary | ICD-10-CM | POA: Insufficient documentation

## 2020-09-23 DIAGNOSIS — I2699 Other pulmonary embolism without acute cor pulmonale: Secondary | ICD-10-CM | POA: Diagnosis not present

## 2020-09-23 DIAGNOSIS — F1020 Alcohol dependence, uncomplicated: Secondary | ICD-10-CM | POA: Diagnosis present

## 2020-09-23 DIAGNOSIS — R079 Chest pain, unspecified: Secondary | ICD-10-CM | POA: Insufficient documentation

## 2020-09-23 DIAGNOSIS — J90 Pleural effusion, not elsewhere classified: Secondary | ICD-10-CM | POA: Diagnosis not present

## 2020-09-23 DIAGNOSIS — R059 Cough, unspecified: Secondary | ICD-10-CM | POA: Insufficient documentation

## 2020-09-23 DIAGNOSIS — Z89432 Acquired absence of left foot: Secondary | ICD-10-CM | POA: Diagnosis not present

## 2020-09-23 DIAGNOSIS — Z20822 Contact with and (suspected) exposure to covid-19: Secondary | ICD-10-CM | POA: Diagnosis present

## 2020-09-23 DIAGNOSIS — E869 Volume depletion, unspecified: Secondary | ICD-10-CM | POA: Diagnosis present

## 2020-09-23 DIAGNOSIS — I2609 Other pulmonary embolism with acute cor pulmonale: Secondary | ICD-10-CM

## 2020-09-23 DIAGNOSIS — Z7901 Long term (current) use of anticoagulants: Secondary | ICD-10-CM | POA: Diagnosis not present

## 2020-09-23 DIAGNOSIS — J9811 Atelectasis: Secondary | ICD-10-CM | POA: Diagnosis not present

## 2020-09-23 DIAGNOSIS — I2694 Multiple subsegmental pulmonary emboli without acute cor pulmonale: Secondary | ICD-10-CM

## 2020-09-23 DIAGNOSIS — Z79899 Other long term (current) drug therapy: Secondary | ICD-10-CM

## 2020-09-23 DIAGNOSIS — I82432 Acute embolism and thrombosis of left popliteal vein: Secondary | ICD-10-CM | POA: Diagnosis present

## 2020-09-23 DIAGNOSIS — E871 Hypo-osmolality and hyponatremia: Secondary | ICD-10-CM | POA: Diagnosis present

## 2020-09-23 DIAGNOSIS — Z811 Family history of alcohol abuse and dependence: Secondary | ICD-10-CM

## 2020-09-23 DIAGNOSIS — R042 Hemoptysis: Secondary | ICD-10-CM | POA: Insufficient documentation

## 2020-09-23 DIAGNOSIS — Z86718 Personal history of other venous thrombosis and embolism: Secondary | ICD-10-CM | POA: Diagnosis not present

## 2020-09-23 DIAGNOSIS — I82412 Acute embolism and thrombosis of left femoral vein: Secondary | ICD-10-CM | POA: Diagnosis present

## 2020-09-23 DIAGNOSIS — Z881 Allergy status to other antibiotic agents status: Secondary | ICD-10-CM | POA: Diagnosis not present

## 2020-09-23 DIAGNOSIS — I2782 Chronic pulmonary embolism: Secondary | ICD-10-CM | POA: Diagnosis not present

## 2020-09-23 DIAGNOSIS — J189 Pneumonia, unspecified organism: Secondary | ICD-10-CM | POA: Diagnosis not present

## 2020-09-23 LAB — COMPREHENSIVE METABOLIC PANEL
ALT: 11 U/L (ref 0–44)
ALT: 10 U/L (ref 0–44)
AST: 12 U/L — ABNORMAL LOW (ref 15–41)
AST: 10 U/L — ABNORMAL LOW (ref 15–41)
Albumin: 3.8 g/dL (ref 3.5–5.0)
Albumin: 3.5 g/dL (ref 3.5–5.0)
Alkaline Phosphatase: 65 U/L (ref 38–126)
Alkaline Phosphatase: 59 U/L (ref 38–126)
Anion gap: 11 (ref 5–15)
Anion gap: 11 (ref 5–15)
BUN: 6 mg/dL (ref 6–20)
BUN: 6 mg/dL (ref 6–20)
CO2: 23 mmol/L (ref 22–32)
CO2: 21 mmol/L — ABNORMAL LOW (ref 22–32)
Calcium: 9 mg/dL (ref 8.9–10.3)
Calcium: 8.7 mg/dL — ABNORMAL LOW (ref 8.9–10.3)
Chloride: 98 mmol/L (ref 98–111)
Chloride: 98 mmol/L (ref 98–111)
Creatinine, Ser: 0.76 mg/dL (ref 0.61–1.24)
Creatinine, Ser: 0.75 mg/dL (ref 0.61–1.24)
GFR, Estimated: >60 mL/min (ref >60)
GFR, Estimated: >60 mL/min (ref >60)
Glucose, Bld: 108 mg/dL — ABNORMAL HIGH (ref 70–99)
Glucose, Bld: 96 mg/dL (ref 70–99)
Potassium: 3.6 mmol/L (ref 3.5–5.1)
Potassium: 3.9 mmol/L (ref 3.5–5.1)
Sodium: 132 mmol/L — ABNORMAL LOW (ref 135–145)
Sodium: 130 mmol/L — ABNORMAL LOW (ref 135–145)
Total Bilirubin: 1 mg/dL (ref 0.3–1.2)
Total Bilirubin: 0.9 mg/dL (ref 0.3–1.2)
Total Protein: 8.3 g/dL — ABNORMAL HIGH (ref 6.5–8.1)
Total Protein: 7.7 g/dL (ref 6.5–8.1)

## 2020-09-23 LAB — CBC WITH DIFFERENTIAL/PLATELET
Abs Immature Granulocytes: 0.06 10*3/uL (ref 0.00–0.07)
Abs Immature Granulocytes: 0.07 10*3/uL (ref 0.00–0.07)
Basophils Absolute: 0.1 10*3/uL (ref 0.0–0.1)
Basophils Absolute: 0.1 10*3/uL (ref 0.0–0.1)
Basophils Relative: 0 %
Basophils Relative: 1 %
Eosinophils Absolute: 0.1 10*3/uL (ref 0.0–0.5)
Eosinophils Absolute: 0 10*3/uL (ref 0.0–0.5)
Eosinophils Relative: 0 %
Eosinophils Relative: 0 %
HCT: 46.3 % (ref 39.0–52.0)
HCT: 40.6 % (ref 39.0–52.0)
Hemoglobin: 13.9 g/dL (ref 13.0–17.0)
Hemoglobin: 12.4 g/dL — ABNORMAL LOW (ref 13.0–17.0)
Immature Granulocytes: 0 %
Immature Granulocytes: 1 %
Lymphocytes Relative: 10 %
Lymphocytes Relative: 9 %
Lymphs Abs: 1.4 10*3/uL (ref 0.7–4.0)
Lymphs Abs: 1.3 10*3/uL (ref 0.7–4.0)
MCH: 23.5 pg — ABNORMAL LOW (ref 26.0–34.0)
MCH: 23.6 pg — ABNORMAL LOW (ref 26.0–34.0)
MCHC: 30 g/dL (ref 30.0–36.0)
MCHC: 30.5 g/dL (ref 30.0–36.0)
MCV: 78.2 fL — ABNORMAL LOW (ref 80.0–100.0)
MCV: 77.2 fL — ABNORMAL LOW (ref 80.0–100.0)
Monocytes Absolute: 1.3 10*3/uL — ABNORMAL HIGH (ref 0.1–1.0)
Monocytes Absolute: 1.3 10*3/uL — ABNORMAL HIGH (ref 0.1–1.0)
Monocytes Relative: 9 %
Monocytes Relative: 9 %
Neutro Abs: 11 10*3/uL — ABNORMAL HIGH (ref 1.7–7.7)
Neutro Abs: 11.4 10*3/uL — ABNORMAL HIGH (ref 1.7–7.7)
Neutrophils Relative %: 81 %
Neutrophils Relative %: 80 %
Platelets: 217 10*3/uL (ref 150–400)
Platelets: 203 10*3/uL (ref 150–400)
RBC: 5.92 MIL/uL — ABNORMAL HIGH (ref 4.22–5.81)
RBC: 5.26 MIL/uL (ref 4.22–5.81)
RDW: 17 % — ABNORMAL HIGH (ref 11.5–15.5)
RDW: 16.6 % — ABNORMAL HIGH (ref 11.5–15.5)
WBC: 13.8 10*3/uL — ABNORMAL HIGH (ref 4.0–10.5)
WBC: 14.1 10*3/uL — ABNORMAL HIGH (ref 4.0–10.5)
nRBC: 0 % (ref 0.0–0.2)
nRBC: 0 % (ref 0.0–0.2)

## 2020-09-23 LAB — LACTIC ACID, PLASMA: Lactic Acid, Venous: 1.1 mmol/L (ref 0.5–1.9)

## 2020-09-23 LAB — D-DIMER, QUANTITATIVE: D-Dimer, Quant: 9.8 ug/mL-FEU — ABNORMAL HIGH (ref 0.00–0.50)

## 2020-09-23 MED ORDER — LACTATED RINGERS IV BOLUS (SEPSIS)
1000.0000 mL | Freq: Once | INTRAVENOUS | Status: AC
  Administered 2020-09-23: 1000 mL via INTRAVENOUS

## 2020-09-23 MED ORDER — THIAMINE HCL 100 MG PO TABS
100.0000 mg | ORAL_TABLET | Freq: Every day | ORAL | Status: DC
  Administered 2020-09-24 – 2020-09-27 (×4): 100 mg via ORAL
  Filled 2020-09-23 (×5): qty 1

## 2020-09-23 MED ORDER — VANCOMYCIN HCL 1500 MG/300ML IV SOLN
1500.0000 mg | Freq: Once | INTRAVENOUS | Status: DC
  Filled 2020-09-23: qty 300

## 2020-09-23 MED ORDER — SODIUM CHLORIDE 0.9 % IV SOLN
2.0000 g | Freq: Three times a day (TID) | INTRAVENOUS | Status: DC
  Administered 2020-09-24 (×2): 2 g via INTRAVENOUS
  Filled 2020-09-23 (×2): qty 2

## 2020-09-23 MED ORDER — OXYCODONE-ACETAMINOPHEN 5-325 MG PO TABS: 1.0000 | ORAL_TABLET | ORAL | 0 refills | Status: DC | PRN

## 2020-09-23 MED ORDER — ACETAMINOPHEN 650 MG RE SUPP: 650.0000 mg | Freq: Four times a day (QID) | RECTAL | Status: DC | PRN

## 2020-09-23 MED ORDER — VANCOMYCIN HCL 1500 MG/300ML IV SOLN
1500.0000 mg | Freq: Three times a day (TID) | INTRAVENOUS | Status: DC
  Filled 2020-09-23: qty 300

## 2020-09-23 MED ORDER — HEPARIN BOLUS VIA INFUSION
4650.0000 [IU] | Freq: Once | INTRAVENOUS | Status: AC
  Administered 2020-09-23: 4650 [IU] via INTRAVENOUS

## 2020-09-23 MED ORDER — NICOTINE 21 MG/24HR TD PT24
21.0000 mg | MEDICATED_PATCH | Freq: Every day | TRANSDERMAL | Status: DC
  Administered 2020-09-25: 21 mg via TRANSDERMAL
  Filled 2020-09-23 (×3): qty 1

## 2020-09-23 MED ORDER — THIAMINE HCL 100 MG/ML IJ SOLN: 100.0000 mg | Freq: Every day | INTRAMUSCULAR | Status: DC

## 2020-09-23 MED ORDER — OXYCODONE HCL 5 MG PO TABS
5.0000 mg | ORAL_TABLET | ORAL | Status: DC | PRN
  Administered 2020-09-24 – 2020-09-27 (×4): 5 mg via ORAL
  Filled 2020-09-23 (×4): qty 1

## 2020-09-23 MED ORDER — LORAZEPAM 1 MG PO TABS
1.0000 mg | ORAL_TABLET | ORAL | Status: AC | PRN
  Administered 2020-09-24: 1 mg via ORAL
  Administered 2020-09-25: 2 mg via ORAL
  Administered 2020-09-25: 1 mg via ORAL
  Administered 2020-09-25 (×2): 2 mg via ORAL
  Filled 2020-09-23 (×2): qty 1
  Filled 2020-09-23: qty 2
  Filled 2020-09-23: qty 1
  Filled 2020-09-23 (×2): qty 2

## 2020-09-23 MED ORDER — FOLIC ACID 1 MG PO TABS
1.0000 mg | ORAL_TABLET | Freq: Every day | ORAL | Status: DC
  Administered 2020-09-24 – 2020-09-27 (×4): 1 mg via ORAL
  Filled 2020-09-23 (×4): qty 1

## 2020-09-23 MED ORDER — ACETAMINOPHEN 325 MG PO TABS
650.0000 mg | ORAL_TABLET | Freq: Four times a day (QID) | ORAL | Status: DC | PRN
  Administered 2020-09-24 – 2020-09-25 (×4): 650 mg via ORAL
  Filled 2020-09-23 (×4): qty 2

## 2020-09-23 MED ORDER — LORAZEPAM 2 MG/ML IJ SOLN
1.0000 mg | INTRAMUSCULAR | Status: AC | PRN
Start: 2020-09-23 — End: 2020-09-26

## 2020-09-23 MED ORDER — IOHEXOL 350 MG/ML SOLN
75.0000 mL | Freq: Once | INTRAVENOUS | Status: AC | PRN
  Administered 2020-09-23: 75 mL via INTRAVENOUS

## 2020-09-23 MED ORDER — ADULT MULTIVITAMIN W/MINERALS CH
1.0000 | ORAL_TABLET | Freq: Every day | ORAL | Status: DC
  Administered 2020-09-24 – 2020-09-27 (×4): 1 via ORAL
  Filled 2020-09-23 (×4): qty 1

## 2020-09-23 MED ORDER — HEPARIN (PORCINE) 25000 UT/250ML-% IV SOLN
2300.0000 [IU]/h | INTRAVENOUS | Status: DC
  Administered 2020-09-23: 1350 [IU]/h via INTRAVENOUS
  Administered 2020-09-25: 2200 [IU]/h via INTRAVENOUS
  Administered 2020-09-25 – 2020-09-26 (×2): 2300 [IU]/h via INTRAVENOUS
  Filled 2020-09-23 (×6): qty 250
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
## 2020-09-23 NOTE — Telephone Encounter (Signed)
Pharmacist at [facility] notified and stated they will fill the script for #20 and cancel the script for #30
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
## 2020-09-23 NOTE — Progress Notes (Signed)
   Subjective:    Patient ID: [name], male    DOB: 1990-11-23, 30 y.o.   MRN: [id number] Virtual Visit via Video Note  I connected with [name] on 09/23/20 at  4:50 PM EST by a video enabled telemedicine application and verified that I am speaking with the correct person using two identifiers.  Location: Patient: Home Provider: Office   I discussed the limitations of evaluation and management by telemedicine and the availability of in person appointments. The patient expressed understanding and agreed to proceed.  History of Present Illness:    Observations/Objective:   Assessment and Plan:   Follow Up Instructions:    I discussed the assessment and treatment plan with the patient. The patient was provided an opportunity to ask questions and all were answered. The patient agreed with the plan and demonstrated an understanding of the instructions.   The patient was advised to call back or seek an in-person evaluation if the symptoms worsen or if the condition fails to improve as anticipated.  I provided 25 with documentation chart review and time spent with patient minutes of non-face-to-face time during this encounter.   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  Chest pain, unspecified type Very suspicious for the possibility of pulmonary embolus certainly it could be related to the pneumonia but we need to rule out pulmonary embolus CT angio necessary - CT ANGIO CHEST PE W OR WO CONTRAST - Comprehensive metabolic panel - CBC with Differential/Platelet - D-dimer, quantitative (not at [facility])  2. Hemoptysis CT angio necessary to rule out pulmonary embolus in the setting of recent surgery hemoptysis pain with deep inspiration and shortness of breath - CT ANGIO CHEST PE W OR WO CONTRAST - Comprehensive metabolic panel - CBC with Differential/Platelet - D-dimer, quantitative (not at [facility])  3. Cough Please see above - CT ANGIO CHEST PE W OR WO CONTRAST - Comprehensive metabolic panel - CBC with Differential/Platelet - D-dimer, quantitative (not at [facility])  4. Fever, unspecified fever cause Covid test is pending possibility of pneumonia exists await the CT angio and the D-dimer - CT ANGIO CHEST PE W OR WO CONTRAST - Comprehensive metabolic panel - CBC with Differential/Platelet - D-dimer, quantitative (not at [facility])

## 2020-09-23 NOTE — Addendum Note (Signed)
Addended by: Lilyan Punt A on: 09/23/2020 07:56 AM   Modules accepted: Orders
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
## 2020-09-23 NOTE — Telephone Encounter (Signed)
Nurses please call [facility] let them know that I sent 2 prescriptions for this patient for oxycodone The second prescription was for 20 tablets that is the one that I would want them the fill  Please please asked them to cancel the 30 tablet prescription The patient is aware of the prescription is at the pharmacy no need to call patient Thank you

## 2020-09-23 NOTE — ED Notes (Signed)
Antibiotics delayed due to difficult stick. Will start once access is obtained.

## 2020-09-23 NOTE — Progress Notes (Signed)
Pharmacy Antibiotic Note  [name] is a 30 y.o. male admitted on 09/23/2020 with pneumonia.  Pharmacy has been consulted for vancomycin and cefepime dosing.  Plan: Cefepime 2gm IV q8 hours Vancomycin 1500 mg IV q8 hours F/u renal function, cultures and clinical course  Height: 6' (182.9 cm) Weight: 83.9 kg (185 lb) IBW/kg (Calculated) : 77.6  Temp (24hrs), Avg:99.7 F (37.6 C), Min:99.4 F (37.4 C), Max:99.9 F (37.7 C)  Recent Labs  Lab 09/23/20 1458 09/23/20 1933  WBC 13.8* 14.1*  CREATININE 0.76 0.75    Estimated Creatinine Clearance: 152.2 mL/min (by C-G formula based on SCr of 0.75 mg/dL).    No Known Allergies  Thank you for allowing pharmacy to be a part of this patient's care.  [name] 09/23/2020 10:19 PM

## 2020-09-23 NOTE — ED Triage Notes (Signed)
Pain in left side x 2 days, sent from radiology

## 2020-09-24 ENCOUNTER — Inpatient Hospital Stay (HOSPITAL_COMMUNITY): Payer: 59

## 2020-09-24 ENCOUNTER — Encounter (HOSPITAL_COMMUNITY): Payer: Self-pay | Admitting: Internal Medicine

## 2020-09-24 DIAGNOSIS — I2609 Other pulmonary embolism with acute cor pulmonale: Secondary | ICD-10-CM

## 2020-09-24 DIAGNOSIS — I2782 Chronic pulmonary embolism: Secondary | ICD-10-CM | POA: Diagnosis not present

## 2020-09-24 DIAGNOSIS — I2699 Other pulmonary embolism without acute cor pulmonale: Principal | ICD-10-CM

## 2020-09-24 DIAGNOSIS — R079 Chest pain, unspecified: Secondary | ICD-10-CM | POA: Diagnosis not present

## 2020-09-24 DIAGNOSIS — F1020 Alcohol dependence, uncomplicated: Secondary | ICD-10-CM | POA: Diagnosis not present

## 2020-09-24 LAB — BASIC METABOLIC PANEL
Anion gap: 10 (ref 5–15)
BUN: 7 mg/dL (ref 6–20)
CO2: 22 mmol/L (ref 22–32)
Calcium: 8.4 mg/dL — ABNORMAL LOW (ref 8.9–10.3)
Chloride: 99 mmol/L (ref 98–111)
Creatinine, Ser: 0.75 mg/dL (ref 0.61–1.24)
GFR, Estimated: >60 mL/min (ref >60)
Glucose, Bld: 110 mg/dL — ABNORMAL HIGH (ref 70–99)
Potassium: 3.7 mmol/L (ref 3.5–5.1)
Sodium: 131 mmol/L — ABNORMAL LOW (ref 135–145)

## 2020-09-24 LAB — PHOSPHORUS: Phosphorus: 3.3 mg/dL (ref 2.5–4.6)

## 2020-09-24 LAB — CBC
HCT: 32.2 % — ABNORMAL LOW (ref 39.0–52.0)
HCT: 36.4 % — ABNORMAL LOW (ref 39.0–52.0)
Hemoglobin: 9.8 g/dL — ABNORMAL LOW (ref 13.0–17.0)
Hemoglobin: 11.5 g/dL — ABNORMAL LOW (ref 13.0–17.0)
MCH: 23.8 pg — ABNORMAL LOW (ref 26.0–34.0)
MCH: 24.3 pg — ABNORMAL LOW (ref 26.0–34.0)
MCHC: 30.4 g/dL (ref 30.0–36.0)
MCHC: 31.6 g/dL (ref 30.0–36.0)
MCV: 78.2 fL — ABNORMAL LOW (ref 80.0–100.0)
MCV: 76.8 fL — ABNORMAL LOW (ref 80.0–100.0)
Platelets: 169 10*3/uL (ref 150–400)
Platelets: 182 10*3/uL (ref 150–400)
RBC: 4.12 MIL/uL — ABNORMAL LOW (ref 4.22–5.81)
RBC: 4.74 MIL/uL (ref 4.22–5.81)
RDW: 16.5 % — ABNORMAL HIGH (ref 11.5–15.5)
RDW: 16.4 % — ABNORMAL HIGH (ref 11.5–15.5)
WBC: 10 10*3/uL (ref 4.0–10.5)
WBC: 12 10*3/uL — ABNORMAL HIGH (ref 4.0–10.5)
nRBC: 0 % (ref 0.0–0.2)
nRBC: 0 % (ref 0.0–0.2)

## 2020-09-24 LAB — HIV ANTIBODY (ROUTINE TESTING W REFLEX): HIV Screen 4th Generation wRfx: NONREACTIVE

## 2020-09-24 LAB — LACTIC ACID, PLASMA: Lactic Acid, Venous: 0.9 mmol/L (ref 0.5–1.9)

## 2020-09-24 LAB — ECHOCARDIOGRAM COMPLETE
Area-P 1/2: 3.89 cm2
Height: 72 in
S' Lateral: 2.8 cm
Weight: 2960 oz

## 2020-09-24 LAB — HEPARIN LEVEL (UNFRACTIONATED)
Heparin Unfractionated: <0.1 IU/mL — ABNORMAL LOW (ref 0.30–0.70)
Heparin Unfractionated: 0.17 IU/mL — ABNORMAL LOW (ref 0.30–0.70)
Heparin Unfractionated: 0.37 IU/mL (ref 0.30–0.70)
Heparin Unfractionated: 0.15 IU/mL — ABNORMAL LOW (ref 0.30–0.70)

## 2020-09-24 LAB — MRSA PCR SCREENING: MRSA by PCR: NEGATIVE

## 2020-09-24 LAB — MAGNESIUM: Magnesium: 2 mg/dL (ref 1.7–2.4)

## 2020-09-24 LAB — NOVEL CORONAVIRUS, NAA: SARS-CoV-2, NAA: NOT DETECTED

## 2020-09-24 LAB — SPECIMEN STATUS REPORT

## 2020-09-24 LAB — SARS-COV-2, NAA 2 DAY TAT

## 2020-09-24 LAB — SARS CORONAVIRUS 2 BY RT PCR (HOSPITAL ORDER, PERFORMED IN ~~LOC~~ HOSPITAL LAB): SARS Coronavirus 2: NEGATIVE

## 2020-09-24 MED ORDER — HEPARIN BOLUS VIA INFUSION
2500.0000 [IU] | Freq: Once | INTRAVENOUS | Status: AC
  Administered 2020-09-24: 2500 [IU] via INTRAVENOUS

## 2020-09-24 MED ORDER — CLINDAMYCIN PHOSPHATE 600 MG/50ML IV SOLN
600.0000 mg | Freq: Two times a day (BID) | INTRAVENOUS | Status: DC
  Administered 2020-09-24: 600 mg via INTRAVENOUS
  Filled 2020-09-24: qty 50

## 2020-09-24 MED ORDER — CHLORHEXIDINE GLUCONATE CLOTH 2 % EX PADS
6.0000 | MEDICATED_PAD | Freq: Every day | CUTANEOUS | Status: DC
  Administered 2020-09-24 – 2020-09-27 (×4): 6 via TOPICAL

## 2020-09-24 MED ORDER — PIPERACILLIN-TAZOBACTAM 3.375 G IVPB
3.3750 g | Freq: Three times a day (TID) | INTRAVENOUS | Status: DC
  Administered 2020-09-24 – 2020-09-27 (×9): 3.375 g via INTRAVENOUS
  Filled 2020-09-24 (×7): qty 50

## 2020-09-24 NOTE — Progress Notes (Signed)
*  PRELIMINARY RESULTS* Echocardiogram 2D Echocardiogram has been performed.  Stacey Drain 09/24/2020, 10:56 AM
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
## 2020-09-24 NOTE — ED Notes (Signed)
Wound NP @ bedside

## 2020-09-24 NOTE — Progress Notes (Signed)
Pharmacy Antibiotic Note  [name] is a 30 y.o. male admitted on 09/23/2020 with HCAP.  Pharmacy has been consulted for  dosing.  Plan: Zosyn 3.375g IV q8h EID over 4 hours F/u renal function, cultures and clinical course  Height: 6' (182.9 cm) Weight: 83.9 kg (185 lb) IBW/kg (Calculated) : 77.6  Temp (24hrs), Avg:99.7 F (37.6 C), Min:99.4 F (37.4 C), Max:99.9 F (37.7 C)  Recent Labs  Lab 09/23/20 1458 09/23/20 1933 09/23/20 2218 09/24/20 0051 09/24/20 0651 09/24/20 0819  WBC 13.8* 14.1*  --   --  10.0 12.0*  CREATININE 0.76 0.75  --   --   --  0.75  LATICACIDVEN  --   --  1.1 0.9  --   --     Estimated Creatinine Clearance: 152.2 mL/min (by C-G formula based on SCr of 0.75 mg/dL).    Allergies  Allergen Reactions  . Vancomycin    Antimicrobials: Zosyn 2/4>> cefepime 2/3>>2/4  Vancomycin 2/3>>2/4  Cultures: 2/3 BCX: pending  Thank you for allowing pharmacy to be a part of this patient's care.  Elder Cyphers, BS Pharm D, [place] Clinical Pharmacist Pager [phone] 09/24/2020 11:11 AM
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
## 2020-09-24 NOTE — ED Notes (Signed)
US bedside
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
## 2020-09-24 NOTE — ED Notes (Signed)
Breakfast tray given. °
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
## 2020-09-24 NOTE — ED Notes (Signed)
Provider at bedside

## 2020-09-25 DIAGNOSIS — F1020 Alcohol dependence, uncomplicated: Secondary | ICD-10-CM | POA: Diagnosis not present

## 2020-09-25 DIAGNOSIS — I2782 Chronic pulmonary embolism: Secondary | ICD-10-CM | POA: Diagnosis not present

## 2020-09-25 DIAGNOSIS — I2609 Other pulmonary embolism with acute cor pulmonale: Secondary | ICD-10-CM | POA: Diagnosis not present

## 2020-09-25 DIAGNOSIS — I2699 Other pulmonary embolism without acute cor pulmonale: Secondary | ICD-10-CM | POA: Diagnosis not present

## 2020-09-25 LAB — CBC
HCT: 35 % — ABNORMAL LOW (ref 39.0–52.0)
Hemoglobin: 11.3 g/dL — ABNORMAL LOW (ref 13.0–17.0)
MCH: 24.9 pg — ABNORMAL LOW (ref 26.0–34.0)
MCHC: 32.3 g/dL (ref 30.0–36.0)
MCV: 77.1 fL — ABNORMAL LOW (ref 80.0–100.0)
Platelets: 173 10*3/uL (ref 150–400)
RBC: 4.54 MIL/uL (ref 4.22–5.81)
RDW: 16.5 % — ABNORMAL HIGH (ref 11.5–15.5)
WBC: 10.3 10*3/uL (ref 4.0–10.5)
nRBC: 0 % (ref 0.0–0.2)

## 2020-09-25 LAB — BASIC METABOLIC PANEL
Anion gap: 10 (ref 5–15)
BUN: 7 mg/dL (ref 6–20)
CO2: 26 mmol/L (ref 22–32)
Calcium: 8.6 mg/dL — ABNORMAL LOW (ref 8.9–10.3)
Chloride: 97 mmol/L — ABNORMAL LOW (ref 98–111)
Creatinine, Ser: 0.74 mg/dL (ref 0.61–1.24)
GFR, Estimated: >60 mL/min (ref >60)
Glucose, Bld: 101 mg/dL — ABNORMAL HIGH (ref 70–99)
Potassium: 3.7 mmol/L (ref 3.5–5.1)
Sodium: 133 mmol/L — ABNORMAL LOW (ref 135–145)

## 2020-09-25 LAB — HEPARIN LEVEL (UNFRACTIONATED)
Heparin Unfractionated: 0.3 IU/mL (ref 0.30–0.70)
Heparin Unfractionated: 0.32 IU/mL (ref 0.30–0.70)

## 2020-09-25 LAB — PROCALCITONIN: Procalcitonin: 0.2 ng/mL

## 2020-09-25 MED ORDER — POLYETHYLENE GLYCOL 3350 17 G PO PACK
17.0000 g | PACK | Freq: Every day | ORAL | Status: DC
  Administered 2020-09-25: 17 g via ORAL
  Filled 2020-09-25 (×3): qty 1

## 2020-09-25 MED ORDER — BISACODYL 5 MG PO TBEC
10.0000 mg | DELAYED_RELEASE_TABLET | Freq: Once | ORAL | Status: AC
  Administered 2020-09-25: 10 mg via ORAL
  Filled 2020-09-25: qty 2
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
## 2020-09-25 NOTE — Progress Notes (Signed)
Patient offered a bath, stated "I will have my mom help me when she wakes up." Told patient that we could help him but he insisted we wait until his mother can help him.
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
## 2020-09-26 DIAGNOSIS — I2694 Multiple subsegmental pulmonary emboli without acute cor pulmonale: Secondary | ICD-10-CM | POA: Diagnosis not present

## 2020-09-26 DIAGNOSIS — I2699 Other pulmonary embolism without acute cor pulmonale: Secondary | ICD-10-CM | POA: Diagnosis not present

## 2020-09-26 DIAGNOSIS — F1020 Alcohol dependence, uncomplicated: Secondary | ICD-10-CM | POA: Diagnosis not present

## 2020-09-26 LAB — CBC
HCT: 33.4 % — ABNORMAL LOW (ref 39.0–52.0)
Hemoglobin: 10.3 g/dL — ABNORMAL LOW (ref 13.0–17.0)
MCH: 23.7 pg — ABNORMAL LOW (ref 26.0–34.0)
MCHC: 30.8 g/dL (ref 30.0–36.0)
MCV: 77 fL — ABNORMAL LOW (ref 80.0–100.0)
Platelets: 212 10*3/uL (ref 150–400)
RBC: 4.34 MIL/uL (ref 4.22–5.81)
RDW: 16.1 % — ABNORMAL HIGH (ref 11.5–15.5)
WBC: 7 10*3/uL (ref 4.0–10.5)
nRBC: 0 % (ref 0.0–0.2)

## 2020-09-26 LAB — HEPARIN LEVEL (UNFRACTIONATED): Heparin Unfractionated: 0.39 IU/mL (ref 0.30–0.70)

## 2020-09-26 MED ORDER — APIXABAN 5 MG PO TABS: 5.0000 mg | ORAL_TABLET | Freq: Two times a day (BID) | ORAL | Status: DC

## 2020-09-26 MED ORDER — APIXABAN 5 MG PO TABS
10.0000 mg | ORAL_TABLET | Freq: Two times a day (BID) | ORAL | Status: DC
  Administered 2020-09-26 – 2020-09-27 (×2): 10 mg via ORAL
  Filled 2020-09-26 (×2): qty 2

## 2020-09-26 MED ORDER — APIXABAN 5 MG PO TABS: 10.0000 mg | ORAL_TABLET | Freq: Two times a day (BID) | ORAL | Status: DC
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
## 2020-09-27 ENCOUNTER — Other Ambulatory Visit (HOSPITAL_COMMUNITY): Payer: Self-pay | Admitting: Pediatric Gastroenterology

## 2020-09-27 ENCOUNTER — Telehealth: Payer: Self-pay | Admitting: Family Medicine

## 2020-09-27 DIAGNOSIS — F1020 Alcohol dependence, uncomplicated: Secondary | ICD-10-CM | POA: Diagnosis not present

## 2020-09-27 DIAGNOSIS — I2699 Other pulmonary embolism without acute cor pulmonale: Secondary | ICD-10-CM | POA: Diagnosis not present

## 2020-09-27 DIAGNOSIS — I2694 Multiple subsegmental pulmonary emboli without acute cor pulmonale: Secondary | ICD-10-CM | POA: Diagnosis not present

## 2020-09-27 LAB — CBC
HCT: 33 % — ABNORMAL LOW (ref 39.0–52.0)
Hemoglobin: 10 g/dL — ABNORMAL LOW (ref 13.0–17.0)
MCH: 23.6 pg — ABNORMAL LOW (ref 26.0–34.0)
MCHC: 30.3 g/dL (ref 30.0–36.0)
MCV: 77.8 fL — ABNORMAL LOW (ref 80.0–100.0)
Platelets: 277 10*3/uL (ref 150–400)
RBC: 4.24 MIL/uL (ref 4.22–5.81)
RDW: 16.6 % — ABNORMAL HIGH (ref 11.5–15.5)
WBC: 5.6 10*3/uL (ref 4.0–10.5)
nRBC: 0 % (ref 0.0–0.2)

## 2020-09-27 MED ORDER — POLYETHYLENE GLYCOL 3350 17 G PO PACK: 17.0000 g | PACK | Freq: Every day | ORAL | 0 refills | Status: DC | PRN

## 2020-09-27 MED ORDER — APIXABAN 5 MG PO TABS: ORAL_TABLET | ORAL | 1 refills | Status: DC

## 2020-09-27 MED ORDER — HYDROCORTISONE 1 % EX CREA
TOPICAL_CREAM | Freq: Three times a day (TID) | CUTANEOUS | Status: DC
  Administered 2020-09-27: 1 via TOPICAL
  Filled 2020-09-27: qty 28

## 2020-09-27 MED ORDER — HYDROCORTISONE 1 % EX CREA: TOPICAL_CREAM | Freq: Three times a day (TID) | CUTANEOUS | 0 refills | Status: DC

## 2020-09-27 MED ORDER — DIPHENHYDRAMINE HCL 25 MG PO CAPS
25.0000 mg | ORAL_CAPSULE | ORAL | Status: AC
  Administered 2020-09-27: 25 mg via ORAL
  Filled 2020-09-27: qty 1

## 2020-09-27 NOTE — Discharge Summary (Addendum)
Physician Discharge Summary  ALMANDO BRAWLEY SKA:[id number] DOB: 03/31/1991 DOA: 09/23/2020  PCP: Kathyrn Drown, MD  Admit date: 09/23/2020 Discharge date: 09/27/2020  Admitted From: Home Disposition:  Home with [facility] RN and PT  Recommendations for Outpatient Follow-up:  1. Follow up with PCP in 1-2 weeks 2. Patient will require Eliquis anticoagulation for at least 6 months. Reassess need for continued therapy at that point.  3. Follow-up at [facility] plastic surgery for further management of left lower extremity wound VAC  Home Health: RN, PT Equipment/Devices: Patient uses crutches or wheelchair, has these at home, chronic wound VAC  Discharge Condition: Patient at his pre-hospital baseline CODE STATUS:Full Diet recommendation: No restrictions  Brief/Interim Summary: Mr. Copes presented to the [facility] after an outpatient CTA Chest ordered by his PCP found him to have bilateral PE. In the ED, he was also found to have extensive DVT of the L leg. On presentation, he met SIRS criteria for a white blood cell count of 14.1, tachypnea, and tachycardia and was treated with IVF and broad-spectrum abx. He was also started on Heparin gtt for his PE/DVT. Once his clinical status improved and his blood cultures were negative, IV abx were discontinued. His respiratory status and pain improved quickly with anticoagulation. He was transitioned to Eliquis and deemed stable for discharge home with [facility] nursing to help with wound vac on L ankle and HH PT to assist with strength/ambulation.    Discharge Diagnoses:  Active Problems:   Alcohol use disorder, severe, dependence ([place])   Pulmonary embolism [facility])    Discharge Instructions  Discharge Instructions    Diet - low sodium heart healthy   Complete by: As directed    Discharge wound care:   Complete by: As directed    Negative pressure wound therapy  Do not change dressing      Comments: Changed weekly on Friday. Question:  Amount of suction?  Answer:  75  mm/Hg   Increase activity slowly   Complete by: As directed      Allergies as of 09/27/2020      Reactions   Vancomycin       Medication List    STOP taking these medications   bacitracin 500 UNIT/GM ointment   mupirocin ointment 2 % Commonly known as: BACTROBAN   oxyCODONE-acetaminophen 5-325 MG tablet Commonly known as: PERCOCET/ROXICET     TAKE these medications   acetaminophen 500 MG tablet Commonly known as: TYLENOL Take 325 mg by mouth every 8 (eight) hours as needed.   apixaban 5 MG Tabs tablet Commonly known as: ELIQUIS Take 54m po bid for 7 days then 541mpo bid What changed: additional instructions   cyclobenzaprine 5 MG tablet Commonly known as: FLEXERIL Take 5 mg by mouth 3 (three) times daily as needed.   gabapentin 300 MG capsule Commonly known as: NEURONTIN Take by mouth.   hydrocortisone cream 1 % Apply topically 3 (three) times daily.   Oxycodone HCl 10 MG Tabs SMARTSIG:1 Tablet(s) By Mouth 4-5 Times Daily   polyethylene glycol 17 g packet Commonly known as: MIRALAX / GLYCOLAX Take 17 g by mouth daily as needed.            Discharge Care Instructions  (From admission, onward)         Start     Ordered   09/27/20 0000  Discharge wound care:       Comments: Negative pressure wound therapy  Do not change dressing  Comments: Changed weekly on Friday. Question:  Amount of suction?  Answer:  75 mm/Hg   09/27/20 1120          Follow-up Information    [facility] Follow up.   Specialty: [place] [address] [place] Why:  RN/PT will call to set up home appointment             Allergies  Allergen Reactions  . Vancomycin     Consultations:  Wound Care   Procedures/Studies: DG Ribs Unilateral Left  Result Date: 09/22/2020 CLINICAL DATA:  Side pain. EXAM: LEFT RIBS - 2 VIEW COMPARISON:  None. FINDINGS: No fracture or other bone lesions are seen involving the ribs. IMPRESSION: Negative. Electronically Signed    By: Constance Holster M.D.   On: 09/22/2020 17:02   CT ANGIO CHEST PE W OR WO CONTRAST  Result Date: 09/23/2020 CLINICAL DATA:  PE suspected, high prob hemoptysis, fever, cough left side rib pain EXAM: CT ANGIOGRAPHY CHEST WITH CONTRAST TECHNIQUE: Multidetector CT imaging of the chest was performed using the standard protocol during bolus administration of intravenous contrast. Multiplanar CT image reconstructions and MIPs were obtained to evaluate the vascular anatomy. CONTRAST:  24m OMNIPAQUE IOHEXOL 350 MG/ML SOLN COMPARISON:  Rib radiographs yesterday. FINDINGS: Cardiovascular: Positive for acute bilateral pulmonary emboli. On the left, filling defects involve the distal main pulmonary artery, many segmental and subsegmental branches in the left lower lobe, and segmental left upper lobe. On the right filling defects involve the segmental and subsegmental branches of the upper, middle, and lower lobes. Thromboembolic burden is moderate. The RV to LV ratio is 0.93. No contrast refluxing into the hepatic veins and IVC. Normal caliber thoracic aorta without dissection. Heart is normal in size. No pericardial effusion. Mediastinum/Nodes: Prominent left hilar lymph node likely reactive. There are small prevascular nodes not enlarged by size criteria. Decompressed esophagus. No suspicious thyroid nodule. Lungs/Pleura: Consolidative and ground-glass left lower lobe consolidation, may be combination of pneumonia and pulmonary infarct. Triangular opacity in the right lower lobe is suspicious for pulmonary infarct. Lingular consolidation may be atelectasis or infectious. There is a small left pleural effusion that is partially loculated and tracks into the inter lobar fissure. Upper Abdomen: No acute or unexpected findings. Musculoskeletal: There are no acute or suspicious osseous abnormalities. No left rib fractures. Review of the MIP images confirms the above findings. IMPRESSION: 1. Positive for acute bilateral  pulmonary emboli with moderate clot burden. No evidence of right heart strain. 2. Consolidative and ground-glass left lower lobe consolidation, may be combination of pneumonia and pulmonary infarct. Triangular opacity in the right lower lobe is suspicious for pulmonary infarct. Lingular consolidation may be atelectasis, infectious or infarct. 3. Small left pleural effusion that is partially loculated and tracks into the inter lobar fissure. 4. No left rib fractures. Critical Value/emergent results were called by telephone at the time of interpretation on 09/23/2020 at 4:43 pm to provider SSallee Lange, who verbally acknowledged these results. Electronically Signed   By: MKeith RakeM.D.   On: 09/23/2020 16:43   [place]ous Img Lower Bilateral (DVT)  Result Date: 09/24/2020 CLINICAL DATA:  Pulmonary embolism, LEFT lower extremity pain EXAM: BILATERAL LOWER EXTREMITY VENOUS DOPPLER ULTRASOUND TECHNIQUE: Gray-scale sonography with compression, as well as color and duplex ultrasound, were performed to evaluate the deep venous system(s) from the level of the common femoral vein through the popliteal and proximal calf veins. COMPARISON:  None FINDINGS: VENOUS RIGHT lower extremity: Patent and compressible from the  groin through popliteal fossa. Spontaneous flow present with intact augmentation and respiratory phasicity. No intraluminal thrombus identified. Visualized calf veins and greater saphenous vein patent. LEFT lower extremity: Thrombus identified throughout the LEFT lower extremity from the common femoral vein through the popliteal vein into calf. Clot varies from occlusive to nonocclusive. Clot also involves the profundus femoral vein and the LEFT saphenofemoral junction. Impaired spontaneous venous flow and compressibility. OTHER N/A Limitations: None IMPRESSION: Extensive deep venous thrombosis throughout the LEFT lower extremity from the LEFT common femoral vein through calf veins including at the LEFT  saphenofemoral junction. No evidence of deep venous thrombosis in the RIGHT lower extremity. Electronically Signed   By: Lavonia Dana M.D.   On: 09/24/2020 10:04   ECHOCARDIOGRAM COMPLETE  Result Date: 09/24/2020    ECHOCARDIOGRAM REPORT   Patient Name:   [name] Date of Exam: 09/24/2020 Medical Rec #:  [id number]       Height:       72.0 in Accession #:    [id number]      Weight:       185.0 lb Date of Birth:  [birth year]        BSA:          2.061 m Patient Age:    29 years        BP:           129/91 mmHg Patient Gender: M               HR:           96 bpm. Exam Location:  [facility] Procedure: 2D Echo, Cardiac Doppler and Color Doppler Indications:    Pulmonary Embolus 415.19 / I26.99  History:        Patient has no prior history of Echocardiogram examinations.                 Alcohol use disorder, severe, dependence, Pulmonary embolism,                 Acute deep vein thrombosis (DVT) of proximal vein of left lower                 extremity.  Sonographer:    Alvino Chapel RCS Referring Phys: [id number] DAWOOD Graciela Husbands  Sonographer Comments: Technically challenging study due to limited acoustic windows. Patient not able to turn on side because he can not breathe when he does. Patient refused Definity at this time, wanted to speak with physician. IMPRESSIONS  1. Left ventricular ejection fraction, by estimation, is 50 to 55%. The left ventricle has low normal function. The left ventricle has no regional wall motion abnormalities. There is mild left ventricular hypertrophy. Left ventricular diastolic parameters were normal.  2. Right ventricular systolic function is normal. The right ventricular size is normal. Tricuspid regurgitation signal is inadequate for assessing PA pressure.  3. The mitral valve is grossly normal. Trivial mitral valve regurgitation.  4. The aortic valve is tricuspid. Aortic valve regurgitation is not visualized.  5. The inferior vena cava is normal in size with greater than 50%  respiratory variability, suggesting right atrial pressure of 3 mmHg. FINDINGS  Left Ventricle: Left ventricular ejection fraction, by estimation, is 50 to 55%. The left ventricle has low normal function. The left ventricle has no regional wall motion abnormalities. The left ventricular internal cavity size was normal in size. There is mild left ventricular hypertrophy. Left ventricular diastolic parameters were normal. Right Ventricle: The right ventricular size  is normal. Right vetricular wall thickness was not well visualized. Right ventricular systolic function is normal. Tricuspid regurgitation signal is inadequate for assessing PA pressure. Left Atrium: Left atrial size was normal in size. Right Atrium: Right atrial size was normal in size. Pericardium: There is no evidence of pericardial effusion. Mitral Valve: The mitral valve is grossly normal. Trivial mitral valve regurgitation. Tricuspid Valve: The tricuspid valve is grossly normal. Tricuspid valve regurgitation is trivial. Aortic Valve: The aortic valve is tricuspid. Aortic valve regurgitation is not visualized. Pulmonic Valve: The pulmonic valve was not well visualized. Pulmonic valve regurgitation is trivial. Aorta: The aortic root is normal in size and structure. Venous: The inferior vena cava is normal in size with greater than 50% respiratory variability, suggesting right atrial pressure of 3 mmHg. IAS/Shunts: No atrial level shunt detected by color flow Doppler.  LEFT VENTRICLE PLAX 2D LVIDd:         3.80 cm  Diastology LVIDs:         2.80 cm  LV e' medial:    10.30 cm/s LV PW:         1.00 cm  LV E/e' medial:  7.3 LV IVS:        1.20 cm  LV e' lateral:   12.00 cm/s LVOT diam:     1.90 cm  LV E/e' lateral: 6.3 LV SV:         51 LV SV Index:   25 LVOT Area:     2.84 cm  LEFT ATRIUM           Index       RIGHT ATRIUM           Index LA diam:      2.90 cm 1.41 cm/m  RA Area:     12.90 cm LA Vol (A4C): 36.3 ml 17.61 ml/m RA Volume:   33.90 ml  16.45  ml/m  AORTIC VALVE LVOT Vmax:   113.00 cm/s LVOT Vmean:  81.000 cm/s LVOT VTI:    0.181 m  AORTA Ao Root diam: 3.10 cm MITRAL VALVE MV Area (PHT): 3.89 cm    SHUNTS MV Decel Time: 195 msec    Systemic VTI:  0.18 m MV E velocity: 75.10 cm/s  Systemic Diam: 1.90 cm MV A velocity: 64.10 cm/s MV E/A ratio:  1.17 Rozann Lesches MD Electronically signed by Rozann Lesches MD Signature Date/Time: 09/24/2020/11:17:26 AM    Final       Subjective: He is feeling better. No shortness of breath  Discharge Exam: Vitals:   09/27/20 0400 09/27/20 0700 09/27/20 0852 09/27/20 1139  BP: 110/63 116/70    Pulse: 70 75 87   Resp:      Temp:   98.7 F (37.1 C) 98.5 F (36.9 C)  TempSrc:   Oral Oral  SpO2: 93% 93% 94%   Weight:      Height:        General: Pt is alert, awake, not in acute distress Cardiovascular: RRR, S1/S2 +, no rubs, no gallops Respiratory: CTA bilaterally, no wheezing, no rhonchi Abdominal: Soft, NT, ND, bowel sounds + Extremities: no edema, no cyanosis    The results of significant diagnostics from this hospitalization (including imaging, microbiology, ancillary and laboratory) are listed below for reference.     Microbiology: Recent Results (from the past 240 hour(s))  Novel Coronavirus, NAA ([facility])     Status: None   Collection Time: 09/22/20 12:00 AM   Specimen: Nasopharyngeal(NP) swabs in vial transport medium  Nasopharynge  Result Value Ref Range Status   SARS-CoV-2, NAA Not Detected Not Detected Final    Comment: This nucleic acid amplification test was developed and its performance characteristics determined by [employer] and [employer]. Nucleic acid amplification tests include RT-PCR and TMA. This test has not been FDA cleared or approved. This test has been authorized by FDA under an Emergency Use Authorization (EUA). This test is only authorized for the duration of time the declaration that circumstances exist justifying the authorization of the emergency use of  in vitro diagnostic tests for detection of SARS-CoV-2 virus and/or diagnosis of COVID-19 infection under section 564(b)(1) of the Act, 21 U.S.C. 053ZJQ-7(H) (1), unless the authorization is terminated or revoked sooner. When diagnostic testing is negative, the possibility of a false negative result should be considered in the context of a patient's recent exposures and the presence of clinical signs and symptoms consistent with COVID-19. An individual without symptoms of COVID-19 and who is not shedding SARS-CoV-2 virus wo uld expect to have a negative (not detected) result in this assay.   SARS-COV-2, NAA 2 DAY TAT     Status: None   Collection Time: 09/22/20 12:00 AM   Nasopharynge  Result Value Ref Range Status   SARS-CoV-2, NAA 2 DAY TAT Performed  Final  Culture, blood (x 2)     Status: None (Preliminary result)   Collection Time: 09/23/20 10:15 PM   Specimen: BLOOD LEFT HAND  Result Value Ref Range Status   Specimen Description BLOOD LEFT HAND  Final   Special Requests   Final    BOTTLES DRAWN AEROBIC AND ANAEROBIC Blood Culture adequate volume   Culture   Final    NO GROWTH 4 DAYS Performed at [facility], [address], [place], [place] [address]    Report Status PENDING  Incomplete  Culture, blood (x 2)     Status: None (Preliminary result)   Collection Time: 09/23/20 10:18 PM   Specimen: BLOOD  Result Value Ref Range Status   Specimen Description BLOOD LEFT ANTECUBITAL  Final   Special Requests   Final    BOTTLES DRAWN AEROBIC AND ANAEROBIC Blood Culture adequate volume   Culture   Final    NO GROWTH 4 DAYS Performed at [facility], [address] [place], [place] [address]    Report Status PENDING  Incomplete  SARS Coronavirus 2 by RT PCR (hospital order, performed in [place] hospital lab) Nasopharyngeal Nasopharyngeal Swab     Status: None   Collection Time: 09/23/20 10:49 PM   Specimen: Nasopharyngeal Swab  Result Value Ref Range Status   SARS  Coronavirus 2 NEGATIVE NEGATIVE Final    Comment: (NOTE) SARS-CoV-2 target nucleic acids are NOT DETECTED.  The SARS-CoV-2 RNA is generally detectable in upper and lower respiratory specimens during the acute phase of infection. The lowest concentration of SARS-CoV-2 viral copies this assay can detect is 250 copies / mL. A negative result does not preclude SARS-CoV-2 infection and should not be used as the sole basis for treatment or other patient management decisions.  A negative result may occur with improper specimen collection / handling, submission of specimen other than nasopharyngeal swab, presence of viral mutation(s) within the areas targeted by this assay, and inadequate number of viral copies (<250 copies / mL). A negative result must be combined with clinical observations, patient history, and epidemiological information.  Fact Sheet for Patients:   StrictlyIdeas.no  Fact Sheet for Healthcare Providers: BankingDealers.co.za  This test is not yet approved  or  cleared by the [place] and has been authorized for detection and/or diagnosis of SARS-CoV-2 by FDA under an Emergency Use Authorization (EUA).  This EUA will remain in effect (meaning this test can be used) for the duration of the COVID-19 declaration under Section 564(b)(1) of the Act, 21 U.S.C. section 360bbb-3(b)(1), unless the authorization is terminated or revoked sooner.  Performed at [facility], [address], [place], [place] [address]   MRSA PCR Screening     Status: None   Collection Time: 09/24/20  4:14 PM   Specimen: Nasal Mucosa; Nasopharyngeal  Result Value Ref Range Status   MRSA by PCR NEGATIVE NEGATIVE Final    Comment:        The GeneXpert MRSA Assay (FDA approved for NASAL specimens only), is one component of a comprehensive MRSA colonization surveillance program. It is not intended to diagnose MRSA infection nor to guide  or monitor treatment for MRSA infections. Performed at [facility], [address], [place], [place] [address]      Labs: BNP (last 3 results) No results for input(s): BNP in the last 8760 hours. Basic Metabolic Panel: Recent Labs  Lab 09/23/20 1458 09/23/20 1933 09/24/20 0819 09/25/20 0240  NA 132* 130* 131* 133*  K 3.6 3.9 3.7 3.7  CL 98 98 99 97*  CO2 23 21* 22 26  GLUCOSE 108* 96 110* 101*  BUN '6 6 7 7  ' CREATININE 0.76 0.75 0.75 0.74  CALCIUM 9.0 8.7* 8.4* 8.6*  MG  --   --  2.0  --   PHOS  --   --  3.3  --    Liver Function Tests: Recent Labs  Lab 09/23/20 1458 09/23/20 1933  AST 12* 10*  ALT 11 10  ALKPHOS 65 59  BILITOT 1.0 0.9  PROT 8.3* 7.7  ALBUMIN 3.8 3.5   No results for input(s): LIPASE, AMYLASE in the last 168 hours. No results for input(s): AMMONIA in the last 168 hours. CBC: Recent Labs  Lab 09/23/20 1458 09/23/20 1933 09/24/20 0651 09/24/20 0819 09/25/20 0240 09/26/20 0224 09/27/20 0533  WBC 13.8* 14.1* 10.0 12.0* 10.3 7.0 5.6  NEUTROABS 11.0* 11.4*  --   --   --   --   --   HGB 13.9 12.4* 9.8* 11.5* 11.3* 10.3* 10.0*  HCT 46.3 40.6 32.2* 36.4* 35.0* 33.4* 33.0*  MCV 78.2* 77.2* 78.2* 76.8* 77.1* 77.0* 77.8*  PLT 217 203 169 182 173 212 277   Cardiac Enzymes: No results for input(s): CKTOTAL, CKMB, CKMBINDEX, TROPONINI in the last 168 hours. BNP: Invalid input(s): POCBNP CBG: No results for input(s): GLUCAP in the last 168 hours. D-Dimer No results for input(s): DDIMER in the last 72 hours. Hgb A1c No results for input(s): HGBA1C in the last 72 hours. Lipid Profile No results for input(s): CHOL, HDL, LDLCALC, TRIG, CHOLHDL, LDLDIRECT in the last 72 hours. Thyroid function studies No results for input(s): TSH, T4TOTAL, T3FREE, THYROIDAB in the last 72 hours.  Invalid input(s): FREET3 Anemia work up No results for input(s): VITAMINB12, FOLATE, FERRITIN, TIBC, IRON, RETICCTPCT in the last 72 hours. Urinalysis    Component  Value Date/Time   COLORURINE AMBER (A) 04/26/2014 2103   APPEARANCEUR CLEAR 04/26/2014 2103   LABSPEC 1.029 04/26/2014 2103   PHURINE 5.5 04/26/2014 2103   GLUCOSEU NEGATIVE 04/26/2014 2103   HGBUR NEGATIVE 04/26/2014 2103   BILIRUBINUR SMALL (A) 04/26/2014 2103   KETONESUR 15 (A) 04/26/2014 2103   PROTEINUR 30 (A) 04/26/2014  2103   UROBILINOGEN 0.2 04/26/2014 2103   NITRITE NEGATIVE 04/26/2014 2103   LEUKOCYTESUR NEGATIVE 04/26/2014 2103   Sepsis Labs Invalid input(s): PROCALCITONIN,  WBC,  LACTICIDVEN Microbiology Recent Results (from the past 240 hour(s))  Novel Coronavirus, NAA ([facility])     Status: None   Collection Time: 09/22/20 12:00 AM   Specimen: Nasopharyngeal(NP) swabs in vial transport medium   Nasopharynge  Result Value Ref Range Status   SARS-CoV-2, NAA Not Detected Not Detected Final    Comment: This nucleic acid amplification test was developed and its performance characteristics determined by [employer], [employer] and [employer]. Nucleic acid amplification tests include RT-PCR and TMA. This test has not been FDA cleared or approved. This test has been authorized by FDA under an Emergency Use Authorization (EUA). This test is only authorized for the duration of time the declaration that circumstances exist justifying the authorization of the emergency use of in vitro diagnostic tests for detection of SARS-CoV-2 virus and/or diagnosis of COVID-19 infection under section 564(b)(1) of the Act, 21 U.S.C. 825KNL-9(J) (1), unless the authorization is terminated or revoked sooner. When diagnostic testing is negative, the possibility of a false negative result should be considered in the context of a patient's recent exposures and the presence of clinical signs and symptoms consistent with COVID-19. An individual without symptoms of COVID-19 and who is not shedding SARS-CoV-2 virus wo uld expect to have a negative (not detected) result in this assay.   SARS-COV-2, NAA 2 DAY TAT      Status: None   Collection Time: 09/22/20 12:00 AM   Nasopharynge  Result Value Ref Range Status   SARS-CoV-2, NAA 2 DAY TAT Performed  Final  Culture, blood (x 2)     Status: None (Preliminary result)   Collection Time: 09/23/20 10:15 PM   Specimen: BLOOD LEFT HAND  Result Value Ref Range Status   Specimen Description BLOOD LEFT HAND  Final   Special Requests   Final    BOTTLES DRAWN AEROBIC AND ANAEROBIC Blood Culture adequate volume   Culture   Final    NO GROWTH 4 DAYS Performed at [facility], [address] [place], [place] [address]    Report Status PENDING  Incomplete  Culture, blood (x 2)     Status: None (Preliminary result)   Collection Time: 09/23/20 10:18 PM   Specimen: BLOOD  Result Value Ref Range Status   Specimen Description BLOOD LEFT ANTECUBITAL  Final   Special Requests   Final    BOTTLES DRAWN AEROBIC AND ANAEROBIC Blood Culture adequate volume   Culture   Final    NO GROWTH 4 DAYS Performed at [facility], [address] [place],  [address]    Report Status PENDING  Incomplete  SARS Coronavirus 2 by RT PCR (hospital order, performed in [place] hospital lab) Nasopharyngeal Nasopharyngeal Swab     Status: None   Collection Time: 09/23/20 10:49 PM   Specimen: Nasopharyngeal Swab  Result Value Ref Range Status   SARS Coronavirus 2 NEGATIVE NEGATIVE Final    Comment: (NOTE) SARS-CoV-2 target nucleic acids are NOT DETECTED.  The SARS-CoV-2 RNA is generally detectable in upper and lower respiratory specimens during the acute phase of infection. The lowest concentration of SARS-CoV-2 viral copies this assay can detect is 250 copies / mL. A negative result does not preclude SARS-CoV-2 infection and should not be used as the sole basis for treatment or other patient management decisions.  A negative result may occur with improper specimen collection /  handling, submission of specimen other than nasopharyngeal swab, presence of viral mutation(s)  within the areas targeted by this assay, and inadequate number of viral copies (<250 copies / mL). A negative result must be combined with clinical observations, patient history, and epidemiological information.  Fact Sheet for Patients:   StrictlyIdeas.no  Fact Sheet for Healthcare Providers: BankingDealers.co.za  This test is not yet approved or  cleared by the [place] FDA and has been authorized for detection and/or diagnosis of SARS-CoV-2 by FDA under an Emergency Use Authorization (EUA).  This EUA will remain in effect (meaning this test can be used) for the duration of the COVID-19 declaration under Section 564(b)(1) of the Act, 21 U.S.C. section 360bbb-3(b)(1), unless the authorization is terminated or revoked sooner.  Performed at [facility], [address], [place], [place] [address]   MRSA PCR Screening     Status: None   Collection Time: 09/24/20  4:14 PM   Specimen: Nasal Mucosa; Nasopharyngeal  Result Value Ref Range Status   MRSA by PCR NEGATIVE NEGATIVE Final    Comment:        The GeneXpert MRSA Assay (FDA approved for NASAL specimens only), is one component of a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

## 2020-09-27 NOTE — Telephone Encounter (Signed)
Nurse's-patient recently discharged from the hospital. Please call patient, let them know that we are aware that they were discharged from the hospital. Please schedule them to follow-up with [place] within the next 7 days. Advised the patient to bring all of their medications with him to the visit. Please inquire if they are having any acute issues currently and documented accordingly.  Nurses-he was recently released from the hospital due to pulmonary embolism.  Please connect with Trinna Post and set him up for a follow-up in approximately 1 week thank you

## 2020-09-27 NOTE — TOC Transition Note (Signed)
Transition of Care [facility]) - CM/SW Discharge Note   Patient Details  Name: [name] MRN: [id number] Date of Birth: [birth year]  Transition of Care [facility]) CM/SW Contact:  Leitha Bleak, RN Phone Number: 09/27/2020, 12:21 PM   Clinical Narrative:   Patient discharging home today. Patient is active with [facility] for RN, MD will add PT. Linda with advanced updated.    Final next level of care: Home/Self Care Barriers to Discharge: Barriers Resolved   Patient Goals and CMS Choice Patient states their goals for this hospitalization and ongoing recovery are:: Home with [facility]   Choice offered to / list presented to : NA  Discharge Placement      Patient and family notified of of transfer: 09/27/20  Discharge Plan and Services In-house Referral: Clinical Social Work Discharge Planning Services: CM Consult Post Acute Care Choice: Durable Medical Equipment,Home Health          DME Arranged: N/A DME Agency: NA       HH Arranged: NA HH Agency: NA     Readmission Risk Interventions Readmission Risk Prevention Plan 09/27/2020  Medication Screening Complete  Transportation Screening Complete  Some recent data might be hidden

## 2020-09-27 NOTE — Consult Note (Signed)
   [facility] Inpatient Consult   09/27/2020  [name] [birth year] [id number]    [facility] [[facility]]  Accountable Care Organization [ACO] Patient: [place] plan  Patient is assigned for [facility] Telephonic RN Care Management for chronic disease management follow up.    Patient will receive a post hospital call and will be evaluated for assessments and disease process education.      Plan: Patient already transitioned . Patient will be followed by [facility] RN Care Coordinator. Will alert [facility] RN of transition home today.   For additional questions or referrals please contact:   Charlesetta Shanks, RN BSN CCM [facility]  [phone] business mobile phone Toll free office [phone]  Fax number: [phone] [email or web address]

## 2020-09-28 ENCOUNTER — Other Ambulatory Visit: Payer: Self-pay | Admitting: *Deleted

## 2020-09-28 LAB — CULTURE, BLOOD (ROUTINE X 2)
Culture: NO GROWTH
Culture: NO GROWTH
Special Requests: ADEQUATE
Special Requests: ADEQUATE
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
## 2020-09-28 NOTE — Telephone Encounter (Signed)
Left message to return call 

## 2020-09-28 NOTE — Telephone Encounter (Signed)
Patient advised to bring all meds to office visit  and stated he not 100% but is much better and will call if any problems or concerns.

## 2020-09-28 NOTE — Telephone Encounter (Signed)
I have schedule the 7 day HFU for him on Monday the 14th at 1:00 you will just need to call him back Autumn he was returning your phone call  Pt call back [phone]

## 2020-09-29 ENCOUNTER — Telehealth: Payer: Self-pay | Admitting: Family Medicine

## 2020-09-29 ENCOUNTER — Ambulatory Visit (INDEPENDENT_AMBULATORY_CARE_PROVIDER_SITE_OTHER): Payer: 59 | Admitting: Family Medicine

## 2020-09-29 ENCOUNTER — Other Ambulatory Visit: Payer: Self-pay

## 2020-09-29 ENCOUNTER — Ambulatory Visit (HOSPITAL_COMMUNITY)
Admission: RE | Admit: 2020-09-29 | Discharge: 2020-09-29 | Disposition: A | Discharge status: Home / Self Care | Payer: 59 | Source: Ambulatory Visit | Attending: Family Medicine | Admitting: Family Medicine

## 2020-09-29 DIAGNOSIS — J90 Pleural effusion, not elsewhere classified: Secondary | ICD-10-CM | POA: Diagnosis not present

## 2020-09-29 DIAGNOSIS — R0600 Dyspnea, unspecified: Secondary | ICD-10-CM | POA: Insufficient documentation

## 2020-09-29 DIAGNOSIS — S72352F Displaced comminuted fracture of shaft of left femur, subsequent encounter for open fracture type IIIA, IIIB, or IIIC with routine healing: Secondary | ICD-10-CM | POA: Diagnosis not present

## 2020-09-29 DIAGNOSIS — J189 Pneumonia, unspecified organism: Secondary | ICD-10-CM | POA: Diagnosis not present

## 2020-09-29 DIAGNOSIS — Z48298 Encounter for aftercare following other organ transplant: Secondary | ICD-10-CM | POA: Diagnosis not present

## 2020-09-29 DIAGNOSIS — I2699 Other pulmonary embolism without acute cor pulmonale: Secondary | ICD-10-CM | POA: Diagnosis not present

## 2020-09-29 DIAGNOSIS — T8789 Other complications of amputation stump: Secondary | ICD-10-CM | POA: Diagnosis not present

## 2020-09-29 DIAGNOSIS — R042 Hemoptysis: Secondary | ICD-10-CM | POA: Insufficient documentation

## 2020-09-29 DIAGNOSIS — R0602 Shortness of breath: Secondary | ICD-10-CM | POA: Diagnosis not present

## 2020-09-29 DIAGNOSIS — R0609 Other forms of dyspnea: Secondary | ICD-10-CM

## 2020-09-29 DIAGNOSIS — R509 Fever, unspecified: Secondary | ICD-10-CM | POA: Diagnosis not present

## 2020-09-29 LAB — POCT HEMOGLOBIN: Hemoglobin: 12.1 g/dL (ref 11–14.6)

## 2020-09-29 MED ORDER — AMOXICILLIN-POT CLAVULANATE 875-125 MG PO TABS: 1.0000 | ORAL_TABLET | Freq: Two times a day (BID) | ORAL | 0 refills | Status: DC

## 2020-09-29 NOTE — Progress Notes (Signed)
   Subjective:    Patient ID: [name], male    DOB: 08/11/1991, 30 y.o.   MRN: [id number]  HPI Patient arrives for follow up from recent hospital stay for PE. Patient is coughing up some blood and giving out of breath easily. Patient still coughing up intermittent small amounts of blood no high fevers no wheezing but gets out of breath easily gives out of energy easily.  Recently in the hospital for pulmonary embolus.  Taken Eliquis twice daily. Results for orders placed or performed in visit on 09/29/20  POCT hemoglobin  Result Value Ref Range   Hemoglobin 12.1 11 - 14.6 g/dL      Review of Systems    Please see above. Objective:   Physical Exam  Moves air well both sides no respiratory distress heart regular pulse normal BP good O2 saturation 95%      Assessment & Plan:  Stat chest x-ray shows opacities in the mid and lower lung more than likely this is worsening pneumonia but there is a possibility there could be some left pleural effusion  We will recheck the patient on Monday if having a difficult time at that point will refer to pulmonary for further input and evaluation.  Currently does not need to go to ER.  To continue the Eliquis if heavy bleeding severe difficulty breathing high fevers or worse go to ER.  Will need consultation with hematology within the next several months more than likely patient will need to be on anticoagulation at least 6 months with follow-up ultrasound of the legs D-dimer and potentially hypercoagulability panel.  The trigger for this blood clot was recent surgery and immobilization

## 2020-09-29 NOTE — Telephone Encounter (Signed)
Pt mom calling to state that pt is still coughing up blood. It is not a large amount of blood. Pt is giving out of breath easier going to and from recliner. Pt mom also inquiring about wound vac.  Spoke with provider. Provider states that is is not unusual to cough up blood for a while with all going on with patient. For wound vac, if they need verbal order we can do that but if needing specific, that would have to come from surgeon. Provider offered to do car visit this afternoon to check O2, hemoglobin and lungs. Home Health nurse is with patient and reports that vitals are good. O2 stats 95%. Mom declined car visit and will keep check on pt. Pt does have follow up on Monday 10/04/20.

## 2020-09-30 ENCOUNTER — Other Ambulatory Visit: Payer: Self-pay | Admitting: *Deleted
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
## 2020-10-01 DIAGNOSIS — Z48298 Encounter for aftercare following other organ transplant: Secondary | ICD-10-CM | POA: Diagnosis not present

## 2020-10-01 DIAGNOSIS — T8789 Other complications of amputation stump: Secondary | ICD-10-CM | POA: Diagnosis not present

## 2020-10-01 DIAGNOSIS — S72352F Displaced comminuted fracture of shaft of left femur, subsequent encounter for open fracture type IIIA, IIIB, or IIIC with routine healing: Secondary | ICD-10-CM | POA: Diagnosis not present

## 2020-10-02 DIAGNOSIS — T8789 Other complications of amputation stump: Secondary | ICD-10-CM | POA: Diagnosis not present

## 2020-10-02 DIAGNOSIS — Z48298 Encounter for aftercare following other organ transplant: Secondary | ICD-10-CM | POA: Diagnosis not present

## 2020-10-02 DIAGNOSIS — S72352F Displaced comminuted fracture of shaft of left femur, subsequent encounter for open fracture type IIIA, IIIB, or IIIC with routine healing: Secondary | ICD-10-CM | POA: Diagnosis not present

## 2020-10-04 ENCOUNTER — Ambulatory Visit (INDEPENDENT_AMBULATORY_CARE_PROVIDER_SITE_OTHER): Payer: 59 | Admitting: Family Medicine

## 2020-10-04 ENCOUNTER — Encounter: Payer: Self-pay | Admitting: Family Medicine

## 2020-10-04 ENCOUNTER — Other Ambulatory Visit: Payer: Self-pay

## 2020-10-04 VITALS — BP 136/84

## 2020-10-04 DIAGNOSIS — I2699 Other pulmonary embolism without acute cor pulmonale: Secondary | ICD-10-CM

## 2020-10-04 DIAGNOSIS — J189 Pneumonia, unspecified organism: Secondary | ICD-10-CM | POA: Diagnosis not present

## 2020-10-04 DIAGNOSIS — Z48298 Encounter for aftercare following other organ transplant: Secondary | ICD-10-CM | POA: Diagnosis not present

## 2020-10-04 DIAGNOSIS — F439 Reaction to severe stress, unspecified: Secondary | ICD-10-CM

## 2020-10-04 DIAGNOSIS — F1010 Alcohol abuse, uncomplicated: Secondary | ICD-10-CM | POA: Diagnosis not present

## 2020-10-04 DIAGNOSIS — T8789 Other complications of amputation stump: Secondary | ICD-10-CM | POA: Diagnosis not present

## 2020-10-04 DIAGNOSIS — S72352F Displaced comminuted fracture of shaft of left femur, subsequent encounter for open fracture type IIIA, IIIB, or IIIC with routine healing: Secondary | ICD-10-CM | POA: Diagnosis not present

## 2020-10-04 MED ORDER — BUSPIRONE HCL 10 MG PO TABS: ORAL_TABLET | ORAL | 0 refills | Status: DC

## 2020-10-04 NOTE — Progress Notes (Addendum)
Subjective:    Patient ID: [name], male    DOB: September 30, 1990, 30 y.o.   MRN: [id number]  HPI  Patient arrives for a follow up on a recent hospitalization for PE. Pulmonary embolism and infarction (HCC)  Pneumonia of left lower lobe due to infectious organism  Stress  Alcohol abuse  Patient was recently seen in the hospital for severe pulmonary embolus along with pulmonary infarction shortness of breath and hemoptysis.  He was placed on Eliquis.  Last week he was having some fevers cough and discolored phlegm and x-rays showed progression possibly pulmonary infarction versus pneumonia.  Patient states his breathing is starting to do better  He also relates that he finds himself stressed and anxious intermittently.  Denies being depressed.  States he does not want to be on any type of daily medicine he is hoping that he would be on something he could just use when needed  Refill be noted that he also has a history of alcohol abuse he states he can drink anywhere from 2 or 3 beers a day all the way up to 12 or 14 in a day he states recently he is laid off the alcohol  Review of Systems  Constitutional: Negative for activity change.  HENT: Negative for congestion and rhinorrhea.   Respiratory: Negative for cough and shortness of breath.   Cardiovascular: Negative for chest pain.  Gastrointestinal: Negative for abdominal pain, diarrhea, nausea and vomiting.  Genitourinary: Negative for dysuria and hematuria.  Neurological: Negative for weakness and headaches.  Psychiatric/Behavioral: Negative for behavioral problems and confusion.       Objective:   Physical Exam Vitals reviewed.  Constitutional:      Appearance: He is well-nourished.  Cardiovascular:     Rate and Rhythm: Normal rate and regular rhythm.     Heart sounds: Normal heart sounds. No murmur heard.   Pulmonary:     Effort: Pulmonary effort is normal.     Breath sounds: Normal breath sounds.  Musculoskeletal:         General: No edema.  Lymphadenopathy:     Cervical: No cervical adenopathy.  Neurological:     Mental Status: He is alert.  Psychiatric:        Behavior: Behavior normal.     We did show him his x-ray and also talked with him about his recent test      Assessment & Plan:  1. Pulmonary embolism and infarction [facility]) Continue the Eliquis he will continue this for at least 6 months we will have him see hematologist when he gets closer to 6 months We will also plan on doing a follow-up ultrasound the left leg as well as blood work closer to that time  He will follow-up with [place] in 1 months sooner if any problems  2. Pneumonia of left lower lobe due to infectious organism He is progressing finish out the antibiotic.  No need to repeat this at this point time.  Follow-up if progressive troubles or worse  3. Stress Significant stress related issues BuSpar can be utilized I do not want to use benzodiazepines for significant reasons patient at this time does not want to be on a daily medication such as Prozac or Zoloft  4. Alcohol abuse I have counseled him that he should stay away from alcohol on days that he does choose to drink he needs to stick with 2 drinks or less I think this will be difficult for him to do but he  states he will try I have offered him counseling as well as referral he has been in rehab before.  I also offered him the possibility of utilizing Campral currently he defers  Patient will follow up with his plastic surgery regarding a partial amputation of the left foot patient is disabled currently  I also talked with the patient that with his family history of alcohol abuse he has a genetic predisposition for this.  This is no fault of his own but it would be wise for him to avoid alcohol.

## 2020-10-05 ENCOUNTER — Telehealth: Payer: Self-pay | Admitting: Family Medicine

## 2020-10-05 ENCOUNTER — Other Ambulatory Visit: Payer: Self-pay | Admitting: *Deleted

## 2020-10-05 DIAGNOSIS — T8789 Other complications of amputation stump: Secondary | ICD-10-CM | POA: Diagnosis not present

## 2020-10-05 DIAGNOSIS — S72352F Displaced comminuted fracture of shaft of left femur, subsequent encounter for open fracture type IIIA, IIIB, or IIIC with routine healing: Secondary | ICD-10-CM | POA: Diagnosis not present

## 2020-10-05 DIAGNOSIS — Z48298 Encounter for aftercare following other organ transplant: Secondary | ICD-10-CM | POA: Diagnosis not present

## 2020-10-05 NOTE — Telephone Encounter (Signed)
Please give verbal orders of approval thank you
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
## 2020-10-05 NOTE — Telephone Encounter (Signed)
Verbal orders left on voicemail. 

## 2020-10-05 NOTE — Telephone Encounter (Signed)
Ria Comment with [facility] calling to get verbal order from PCP. PT will work on safety with tranfers; leg dressing;safety; home exercise. 2 week 4 and 1 week 3. Please advise. May leave detailed message on [employer].

## 2020-10-07 DIAGNOSIS — Z48298 Encounter for aftercare following other organ transplant: Secondary | ICD-10-CM | POA: Diagnosis not present

## 2020-10-07 DIAGNOSIS — T8789 Other complications of amputation stump: Secondary | ICD-10-CM | POA: Diagnosis not present

## 2020-10-07 DIAGNOSIS — S72352F Displaced comminuted fracture of shaft of left femur, subsequent encounter for open fracture type IIIA, IIIB, or IIIC with routine healing: Secondary | ICD-10-CM | POA: Diagnosis not present

## 2020-10-08 DIAGNOSIS — S72352F Displaced comminuted fracture of shaft of left femur, subsequent encounter for open fracture type IIIA, IIIB, or IIIC with routine healing: Secondary | ICD-10-CM | POA: Diagnosis not present

## 2020-10-08 DIAGNOSIS — Z48298 Encounter for aftercare following other organ transplant: Secondary | ICD-10-CM | POA: Diagnosis not present

## 2020-10-08 DIAGNOSIS — T8789 Other complications of amputation stump: Secondary | ICD-10-CM | POA: Diagnosis not present

## 2020-10-11 DIAGNOSIS — Z760 Encounter for issue of repeat prescription: Secondary | ICD-10-CM | POA: Diagnosis not present

## 2020-10-12 DIAGNOSIS — Z48298 Encounter for aftercare following other organ transplant: Secondary | ICD-10-CM | POA: Diagnosis not present

## 2020-10-12 DIAGNOSIS — S72352F Displaced comminuted fracture of shaft of left femur, subsequent encounter for open fracture type IIIA, IIIB, or IIIC with routine healing: Secondary | ICD-10-CM | POA: Diagnosis not present

## 2020-10-12 DIAGNOSIS — T8789 Other complications of amputation stump: Secondary | ICD-10-CM | POA: Diagnosis not present

## 2020-10-12 DIAGNOSIS — S98912D Complete traumatic amputation of left foot, level unspecified, subsequent encounter: Secondary | ICD-10-CM | POA: Diagnosis not present

## 2020-10-14 DIAGNOSIS — Z48298 Encounter for aftercare following other organ transplant: Secondary | ICD-10-CM | POA: Diagnosis not present

## 2020-10-14 DIAGNOSIS — S72352F Displaced comminuted fracture of shaft of left femur, subsequent encounter for open fracture type IIIA, IIIB, or IIIC with routine healing: Secondary | ICD-10-CM | POA: Diagnosis not present

## 2020-10-14 DIAGNOSIS — T8789 Other complications of amputation stump: Secondary | ICD-10-CM | POA: Diagnosis not present

## 2020-10-19 DIAGNOSIS — S72352F Displaced comminuted fracture of shaft of left femur, subsequent encounter for open fracture type IIIA, IIIB, or IIIC with routine healing: Secondary | ICD-10-CM | POA: Diagnosis not present

## 2020-10-19 DIAGNOSIS — Z48298 Encounter for aftercare following other organ transplant: Secondary | ICD-10-CM | POA: Diagnosis not present

## 2020-10-19 DIAGNOSIS — T8789 Other complications of amputation stump: Secondary | ICD-10-CM | POA: Diagnosis not present

## 2020-10-22 DIAGNOSIS — T8789 Other complications of amputation stump: Secondary | ICD-10-CM | POA: Diagnosis not present

## 2020-10-22 DIAGNOSIS — S72352F Displaced comminuted fracture of shaft of left femur, subsequent encounter for open fracture type IIIA, IIIB, or IIIC with routine healing: Secondary | ICD-10-CM | POA: Diagnosis not present

## 2020-10-22 DIAGNOSIS — Z48298 Encounter for aftercare following other organ transplant: Secondary | ICD-10-CM | POA: Diagnosis not present

## 2020-10-26 DIAGNOSIS — Z48298 Encounter for aftercare following other organ transplant: Secondary | ICD-10-CM | POA: Diagnosis not present

## 2020-10-26 DIAGNOSIS — T8789 Other complications of amputation stump: Secondary | ICD-10-CM | POA: Diagnosis not present

## 2020-10-26 DIAGNOSIS — S72352F Displaced comminuted fracture of shaft of left femur, subsequent encounter for open fracture type IIIA, IIIB, or IIIC with routine healing: Secondary | ICD-10-CM | POA: Diagnosis not present

## 2020-10-28 ENCOUNTER — Telehealth: Payer: Self-pay | Admitting: Family Medicine

## 2020-10-28 NOTE — Telephone Encounter (Signed)
Will discuss further with patient when he comes in for his office visit regarding alcohol and depression/anxiety  The following was a MyChart message regarding Trinna Post from his mother  Trinna Post has an appointment with you Monday and wanted to give you heads up so you can have conversation with him and maybe get him back on track. He has started drinking again and even missing doses of blood thinner. I even looked up online and showed him that this can cause internal bleeding. He don't seem to be able to handle life struggles when things go wrong or when there are financial struggles. Not sure why he can't realize that drinking just makes it worse. It is like he is on suicide mission and keeps telling me he won't be around long. I told him he won't if he don't get it together and it is his choice. I have just about give up on him and ready to throw my hands up. Maybe you can talk with him and he may believe you.

## 2020-10-29 DIAGNOSIS — Z48298 Encounter for aftercare following other organ transplant: Secondary | ICD-10-CM | POA: Diagnosis not present

## 2020-10-29 DIAGNOSIS — S72352F Displaced comminuted fracture of shaft of left femur, subsequent encounter for open fracture type IIIA, IIIB, or IIIC with routine healing: Secondary | ICD-10-CM | POA: Diagnosis not present

## 2020-10-29 DIAGNOSIS — T8789 Other complications of amputation stump: Secondary | ICD-10-CM | POA: Diagnosis not present

## 2020-10-29 MED FILL — ELIQUIS 5 MG TABLET: 5 | 30 days supply | Qty: 60 | Fill #0

## 2020-11-01 ENCOUNTER — Ambulatory Visit (INDEPENDENT_AMBULATORY_CARE_PROVIDER_SITE_OTHER): Payer: 59 | Admitting: Family Medicine

## 2020-11-01 ENCOUNTER — Other Ambulatory Visit: Payer: Self-pay | Admitting: Family Medicine

## 2020-11-01 ENCOUNTER — Encounter: Payer: Self-pay | Admitting: Family Medicine

## 2020-11-01 ENCOUNTER — Other Ambulatory Visit: Payer: Self-pay

## 2020-11-01 VITALS — BP 128/86 | HR 79 | Temp 97.3°F | Wt 184.8 lb

## 2020-11-01 DIAGNOSIS — I2699 Other pulmonary embolism without acute cor pulmonale: Secondary | ICD-10-CM | POA: Diagnosis not present

## 2020-11-01 MED ORDER — APIXABAN 5 MG PO TABS: ORAL_TABLET | ORAL | 3 refills | Status: DC

## 2020-11-01 MED ORDER — APIXABAN 5 MG PO TABS: 5.0000 mg | ORAL_TABLET | Freq: Two times a day (BID) | ORAL | 3 refills | Status: DC

## 2020-11-01 NOTE — Progress Notes (Signed)
   Subjective:    Patient ID: [name], male    DOB: 1991-06-21, 30 y.o.   MRN: [id number]  HPI Pt here for follow up. Pt states he is doing better. Pt would like xray of left leg.  Pt has home health nurse that comes out twice per week.  Patient has a fair amount of foot pain also has difficult time getting around using crutches.  Patient had pulmonary embolus after developing blood clots from recent surgery has been on Eliquis for over a month no bleeding issues breathing doing better  States he has upcoming surgery on his foot with possible skin graft possible bone graft Review of Systems Please see above    Objective:   Physical Exam  Lungs are clear respiratory rate normal heart regular pulse normal BP good    Assessment & Plan:  Partial amputation of the foot Has wound VAC on foot No current infection We will be following up with specialist soon Defers on any x-rays currently today Patient working hard at trying to be healthy.  Denies being depressed.  We talked at length about the importance of staying away from alcohol Patient has a history of pulmonary embolus On Eliquis currently Refill sent in  Will be referring patient to hematology Patient will be having upcoming surgery Uncertain when he would be approved to have surgery potentially 3 months after pulmonary embolus?  Also currently plan on having him on Eliquis for approximately 5 to 6 months but may need further work-up before coming off of Eliquis.  Due to the complexities will go ahead with consultation with hematology Dr. Ellin Saba

## 2020-11-02 DIAGNOSIS — T8789 Other complications of amputation stump: Secondary | ICD-10-CM | POA: Diagnosis not present

## 2020-11-02 DIAGNOSIS — Z48298 Encounter for aftercare following other organ transplant: Secondary | ICD-10-CM | POA: Diagnosis not present

## 2020-11-02 DIAGNOSIS — S72352F Displaced comminuted fracture of shaft of left femur, subsequent encounter for open fracture type IIIA, IIIB, or IIIC with routine healing: Secondary | ICD-10-CM | POA: Diagnosis not present

## 2020-11-05 DIAGNOSIS — T8789 Other complications of amputation stump: Secondary | ICD-10-CM | POA: Diagnosis not present

## 2020-11-05 DIAGNOSIS — S72352F Displaced comminuted fracture of shaft of left femur, subsequent encounter for open fracture type IIIA, IIIB, or IIIC with routine healing: Secondary | ICD-10-CM | POA: Diagnosis not present

## 2020-11-05 DIAGNOSIS — Z48298 Encounter for aftercare following other organ transplant: Secondary | ICD-10-CM | POA: Diagnosis not present

## 2020-11-09 DIAGNOSIS — S82262B Displaced segmental fracture of shaft of left tibia, initial encounter for open fracture type I or II: Secondary | ICD-10-CM | POA: Diagnosis not present

## 2020-11-09 DIAGNOSIS — S82202N Unspecified fracture of shaft of left tibia, subsequent encounter for open fracture type IIIA, IIIB, or IIIC with nonunion: Secondary | ICD-10-CM | POA: Diagnosis not present

## 2020-11-09 DIAGNOSIS — S82451D Displaced comminuted fracture of shaft of right fibula, subsequent encounter for closed fracture with routine healing: Secondary | ICD-10-CM | POA: Diagnosis not present

## 2020-11-09 DIAGNOSIS — M24572 Contracture, left ankle: Secondary | ICD-10-CM | POA: Diagnosis not present

## 2020-11-09 DIAGNOSIS — S82262A Displaced segmental fracture of shaft of left tibia, initial encounter for closed fracture: Secondary | ICD-10-CM | POA: Diagnosis not present

## 2020-11-09 DIAGNOSIS — S82402N Unspecified fracture of shaft of left fibula, subsequent encounter for open fracture type IIIA, IIIB, or IIIC with nonunion: Secondary | ICD-10-CM | POA: Diagnosis not present

## 2020-11-10 ENCOUNTER — Telehealth: Payer: Self-pay

## 2020-11-10 ENCOUNTER — Telehealth: Payer: Self-pay | Admitting: Family Medicine

## 2020-11-10 NOTE — Telephone Encounter (Signed)
Pt was discharged from [facility] yesterday and he will not discuss with his mother but he said he needs to make appt with Dr Lorin Picket said it was urgent but will not let us know what it is. We have told mother that we do not have any spots that I can send a message back for a nurse to talk to her.   Pt call back [phone]

## 2020-11-10 NOTE — Telephone Encounter (Addendum)
Patient scheduled phone visit tomorrow at 11am with Dr Lorin Picket to discuss

## 2020-11-10 NOTE — Telephone Encounter (Signed)
Nurses 1.  Is anyone of you all familiar with wound vacs and how to take them off? #2 if they would like to be seen this week by myself I am willing to see them but I am not a wound VAC expert in regards to advising how to take 1 off.  I may be able to help Trinna Post just by talking with them over the phone unless he would like to be seen #3 please find out from Sheran Luz what are they talking about doing surgery?

## 2020-11-10 NOTE — Telephone Encounter (Signed)
[name] wanted me to call and get appointment to see you this afternoon but could not get appointment. He said urgent that he sees you. Home health discharged him and he still has wound vac. He had relapse - drinking episode. Said he wasn't going to drink again two days ago but I know he had one beer yesterday yet he don't know that I know. I really think he is bipolar. Never seen anyone act way he does when they drink. Yesterday seemed  depressed and agitated. I don't know what to do since he can't get in to see you today.

## 2020-11-10 NOTE — Telephone Encounter (Signed)
Contacted patient but had to leave a message.  Pt returned call. Pt states that he went to see ortho yesterday and they want to schedule him for surgery but patient states that he and Dr.Scott had talked about a time frame since he is on blood thinner. Pt would like provider to contact ortho to talk to them about that. Also Home Health discharged him. Pt had some words with one of the Home Health nurses and that company is no longer going to come out. Pt has wound vac on and is supposed to be changed yesterday. Pt states the surgeons said he could do a wet to dry until he sees Plastics on Monday. Pt nor patients mother does not feel comfortable with taking wound vac off.   Mom also sent my chart message. Please advise. Thank you

## 2020-11-11 ENCOUNTER — Telehealth (INDEPENDENT_AMBULATORY_CARE_PROVIDER_SITE_OTHER): Payer: 59 | Admitting: Family Medicine

## 2020-11-11 ENCOUNTER — Ambulatory Visit (HOSPITAL_COMMUNITY): Payer: 59 | Admitting: Hematology

## 2020-11-11 ENCOUNTER — Other Ambulatory Visit: Payer: Self-pay

## 2020-11-11 DIAGNOSIS — I2699 Other pulmonary embolism without acute cor pulmonale: Secondary | ICD-10-CM

## 2020-11-11 DIAGNOSIS — I824Y2 Acute embolism and thrombosis of unspecified deep veins of left proximal lower extremity: Secondary | ICD-10-CM | POA: Diagnosis not present

## 2020-11-11 DIAGNOSIS — E162 Hypoglycemia, unspecified: Secondary | ICD-10-CM | POA: Diagnosis not present

## 2020-11-11 DIAGNOSIS — F10929 Alcohol use, unspecified with intoxication, unspecified: Secondary | ICD-10-CM | POA: Diagnosis not present

## 2020-11-11 DIAGNOSIS — F1010 Alcohol abuse, uncomplicated: Secondary | ICD-10-CM | POA: Diagnosis not present

## 2020-11-11 DIAGNOSIS — T07XXXA Unspecified multiple injuries, initial encounter: Secondary | ICD-10-CM

## 2020-11-11 DIAGNOSIS — E161 Other hypoglycemia: Secondary | ICD-10-CM | POA: Diagnosis not present

## 2020-11-11 NOTE — Progress Notes (Signed)
   Subjective:    Patient ID: [name], male    DOB: 27-Jul-1991, 30 y.o.   MRN: [id number]  HPI discuss surgery.  Patient was seen by his specialist with orthopedics they recommended the possibility of doing it surgery on his foot.  He relates how he is uncertain if this is the best idea.  He recently had a pulmonary embolus which was unfortunately in early February.  In addition to this he also had a spell where he got mad at home health nurse and they decided to drop care for him.  He states he does need home health to help him with dressing changes.  He will be seeing plastic surgery this coming Monday.   Virtual Visit via Telephone Note  I connected with [name] on 11/11/20 at 11:00 AM EDT by telephone and verified that I am speaking with the correct person using two identifiers.  Location: Patient: home Provider: office   I discussed the limitations, risks, security and privacy concerns of performing an evaluation and management service by telephone and the availability of in person appointments. I also discussed with the patient that there may be a patient responsible charge related to this service. The patient expressed understanding and agreed to proceed.   History of Present Illness:    Observations/Objective:   Assessment and Plan:   Follow Up Instructions:    I discussed the assessment and treatment plan with the patient. The patient was provided an opportunity to ask questions and all were answered. The patient agreed with the plan and demonstrated an understanding of the instructions.   The patient was advised to call back or seek an in-person evaluation if the symptoms worsen or if the condition fails to improve as anticipated.  I provided 25 minutes of non-face-to-face time during this encounter.       Review of Systems Complains of foot pain discomfort denies any chest pain shortness of breath or bleeding    Objective:   Physical  Exam Today's visit was via telephone Physical exam was not possible for this visit        Assessment & Plan:  1. Pulmonary embolism and infarction [facility]) Patient is under the care of myself currently on Eliquis doing well.  We will consult with hematology regarding whether or not and when he can have surgery on his foot.  We will seek consultation as well as touch base with hematology  2. Alcohol abuse Alcohol abuse patient does intermittently drink alcohol he was strongly encouraged to stay away from alcohol.  3. Acute deep vein thrombosis (DVT) of proximal vein of left lower extremity (HCC) Being treated with Eliquis no bleeding problems or pain currently  4. Multiple fractures Patient has foot surgery coming up but needs to be cleared to have the surgery.  We will consult with hematology  Previous foot surgery with wound issues.  Needs home health to follow him. Unfortunately his previous home health organization discharged him.  I have talked with him at length about the importance of staying away from alcohol.  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  I have offered him counseling and behavioral health evaluation he refuses this.  He denies being suicidal.  We will have the patient do a follow-up in approximately 3 to 4 weeks.

## 2020-11-12 NOTE — Addendum Note (Signed)
Addended by: Marlowe Shores on: 11/12/2020 08:21 AM   Modules accepted: Orders

## 2020-11-12 NOTE — Progress Notes (Signed)
11/12/20- Home Health Referral placed

## 2020-11-14 ENCOUNTER — Telehealth: Payer: Self-pay | Admitting: Family Medicine

## 2020-11-14 NOTE — Telephone Encounter (Signed)
Nurses I will need to speak with orthopedic specialist at [facility] health Dr. Guillermina City He is the orthopedist taking care of [name] Ideally I would like to speak with Dr. Edwena Blow He can call my cell number [phone]  If you can only get to his nurse then it is important for them to know that hematology recommends that the patient waits 3 months before doing any surgery after having pulmonary embolism and being treated for it.  Therefore he is not cleared for surgery.  The soonest he will be cleared for surgery would be the second week of May 2022

## 2020-11-15 ENCOUNTER — Telehealth: Payer: Self-pay | Admitting: Family Medicine

## 2020-11-15 NOTE — Telephone Encounter (Signed)
Left message to have Dr Edwena Blow call Dr Lorin Picket on cellphone

## 2020-11-15 NOTE — Telephone Encounter (Signed)
Left message to return call 

## 2020-11-15 NOTE — Telephone Encounter (Signed)
Nurses Please make sure you let [name] know that I did speak with Dr.Ostrum nurse regarding his blood clot that he is being treated for.  Dr.Ostrum was not in today.  We did relate to them that the earliest to do surgery would be early May 2022.  It is important for [name] to keep his appointment with the hematologist in early April so they can formally state when it would be fine for him to do his surgery.  Thank you-Dr. Lorin Picket

## 2020-11-16 ENCOUNTER — Ambulatory Visit (INDEPENDENT_AMBULATORY_CARE_PROVIDER_SITE_OTHER): Payer: 59 | Admitting: Family Medicine

## 2020-11-16 ENCOUNTER — Other Ambulatory Visit: Payer: Self-pay

## 2020-11-16 DIAGNOSIS — J020 Streptococcal pharyngitis: Secondary | ICD-10-CM

## 2020-11-16 DIAGNOSIS — Z760 Encounter for issue of repeat prescription: Secondary | ICD-10-CM | POA: Diagnosis not present

## 2020-11-16 MED ORDER — AMOXICILLIN 500 MG PO TABS: 500.0000 mg | ORAL_TABLET | Freq: Three times a day (TID) | ORAL | 0 refills | Status: DC

## 2020-11-16 NOTE — Telephone Encounter (Signed)
See other message- Dr Lorin Picket spoke with Dr Joylene Igo office

## 2020-11-16 NOTE — Patient Instructions (Signed)
Strep Throat, Adult Strep throat is an infection of the throat. It is caused by germs (bacteria). Strep throat is common during the cold months of the year. It mostly affects children who are 5-28 years old. However, people of all ages can get it at any time of the year. When strep throat affects the tonsils, it is called tonsillitis. When it affects the back of the throat, it is called pharyngitis. This infection spreads from person to person through coughing, sneezing, or having close contact. What are the causes? This condition is caused by the Streptococcus pyogenes germ. What increases the risk? You are more likely to develop this condition if:  You care for young children. Children are more likely to get strep throat and may spread it to others.  You go to crowded places. Germs can spread easily in such places.  You kiss or touch someone who has strep throat. What are the signs or symptoms? Symptoms of this condition include:  Fever or chills.  Redness, swelling, or pain in the tonsils or throat.  Pain or trouble when swallowing.  White or yellow spots on the tonsils or throat.  Tender glands in the neck and under the jaw.  Bad breath.  Red rash all over the body. This is rare. How is this treated? This condition may be treated with:  Medicines that kill germs (antibiotics).  Medicines that treat pain or fever. These include: ? Ibuprofen or acetaminophen. ? Aspirin, only for patients who are over the age of 18. ? Throat lozenges. ? Throat sprays. Follow these instructions at home: Medicines  Take over-the-counter and prescription medicines only as told by your doctor.  Take your antibiotic medicine as told by your doctor. Do not stop taking the antibiotic even if you start to feel better.   Eating and drinking  If you have trouble swallowing, eat soft foods until your throat feels better.  Drink enough fluid to keep your pee (urine) pale yellow.  To help with  pain, you may have: ? Warm fluids, such as soup and tea. ? Cold fluids, such as frozen desserts or popsicles.   General instructions  Rinse your mouth (gargle) with a salt-water mixture 3-4 times a day or as needed. To make a salt-water mixture, dissolve -1 tsp (3-6 g) of salt in 1 cup (237 mL) of warm water.  Rest as much as you can.  Stay home from work or school until you have been taking antibiotics for 24 hours.  Avoid smoking or being around people who smoke.  Keep all follow-up visits as told by your doctor. This is important. How is this prevented?  Do not share food, drinking cups, or personal items. They can cause the germs to spread.  Wash your hands well with soap and water. Make sure that all people in your house wash their hands well.  Have family members tested if they have a fever or a sore throat. They may need an antibiotic if they have strep throat.   Contact a doctor if:  You have swelling in your neck that keeps getting bigger.  You get a rash, cough, or earache.  You cough up a thick fluid that is green, yellow-brown, or bloody.  You have pain that does not get better with medicine.  Your symptoms get worse instead of getting better.  You have a fever. Get help right away if:  You vomit.  You have a very bad headache.  Your neck hurts or feels stiff.    You have chest pain or are short of breath.  You have drooling, very bad throat pain, or changes in your voice.  Your neck is swollen, or the skin gets red and tender.  Your mouth is dry, or you are peeing less than normal.  You keep feeling more tired or have trouble waking up.  Your joints are red or painful. Summary  Strep throat is an infection of the throat. It is caused by germs (bacteria).  This infection can spread from person to person through coughing, sneezing, or having close contact.  Take your medicines, including antibiotics, as told by your doctor. Do not stop taking the  antibiotic even if you start to feel better.  To prevent the spread of germs, wash your hands well with soap and water. Have others do the same. Do not share food, drinking cups, or personal items.  Get help right away if you have a bad headache, chest pain, shortness of breath, a stiff or painful neck, or you vomit. This information is not intended to replace advice given to you by your health care provider. Make sure you discuss any questions you have with your health care provider. Document Revised: 10/25/2018 Document Reviewed: 10/25/2018 Elsevier Patient Education  2021 Elsevier Inc.  

## 2020-11-16 NOTE — Progress Notes (Signed)
   Subjective:    Patient ID: [name], male    DOB: 05/29/1991, 30 y.o.   MRN: [id number]  HPI  Patient arrives with sore throat for a few days. Patient also would like to discuss surgery and wound care I did discuss with him how the we did point out to the orthopedist that hematology is recommending to wait on surgery until he is 3 months out which would be approximately May 8 Patient also complains of sore throat little bit of cough and congestion but the sore throat is the main problem denies high fever wheezing difficulty breathing States he had Covid a few months ago  Review of Systems  Constitutional: Negative for activity change, chills and fever.  HENT: Positive for congestion and rhinorrhea. Negative for ear pain.   Eyes: Negative for discharge.  Respiratory: Positive for cough. Negative for wheezing.   Cardiovascular: Negative for chest pain.  Gastrointestinal: Negative for nausea and vomiting.  Musculoskeletal: Negative for arthralgias.       Objective:   Physical Exam Vitals reviewed.  Cardiovascular:     Rate and Rhythm: Normal rate and regular rhythm.     Heart sounds: Normal heart sounds. No murmur heard.   Pulmonary:     Effort: Pulmonary effort is normal.     Breath sounds: Normal breath sounds.  Lymphadenopathy:     Cervical: No cervical adenopathy.  Neurological:     Mental Status: He is alert.  Psychiatric:        Behavior: Behavior normal.    Throat severe erythema no signs of abscess does have some anterior adenopathy  Rapid strep was attempted patient did not tolerate this was attempted several times     Assessment & Plan:  Strep throat Warm salt water gargles Tylenol as needed Amoxicillin 3 times daily for 10 days Follow-up if progressive troubles or worse Warning signs discussed   As for his surgery he will see hematology in early April they will help determine whether or not he needs to bridge Lovenox with his surgery and whether  or not he may need further work-up to make sure there is not long-term hypercoagulability issues

## 2020-11-18 ENCOUNTER — Telehealth: Payer: Self-pay

## 2020-11-18 NOTE — Telephone Encounter (Signed)
  FYI ONLY FOR NOW.  Holly with [facility]-Dr [place] office, called to see if we needed help finding somewhere for him to go for home health/wound vac.  I told her there is a referral in the system for [facility].  She doesn't think he will qualify for home health because he isn't home bound.  That was one of the problems before, they would go out to see him and the patient would never be home.  She said if [facility] will take him that will be great but she doesn't think they will. She said they had tried to get him into a Wound care center in [place] but per mom, they won't see him. (not sure why? Maybe insurance)  For now they have told patient to do wet to dry dressings. She left her number if we need anything from them.  She is not sure what the next step will be. [phone]

## 2020-11-21 NOTE — Telephone Encounter (Signed)
Another option if they are needing wound care would be the wound care center with [facility] in [place].  That should be covered since he is on [place].

## 2020-11-22 NOTE — Telephone Encounter (Signed)
Left message to return call with [facility] at [facility]

## 2020-11-24 ENCOUNTER — Inpatient Hospital Stay (HOSPITAL_COMMUNITY): Payer: 59 | Attending: Hematology | Admitting: Hematology

## 2020-11-24 ENCOUNTER — Encounter (HOSPITAL_COMMUNITY): Payer: Self-pay | Admitting: Hematology

## 2020-11-24 ENCOUNTER — Other Ambulatory Visit (HOSPITAL_COMMUNITY): Payer: Self-pay

## 2020-11-24 ENCOUNTER — Encounter: Payer: 59 | Attending: Internal Medicine | Admitting: Internal Medicine

## 2020-11-24 ENCOUNTER — Other Ambulatory Visit: Payer: Self-pay

## 2020-11-24 VITALS — BP 125/87 | HR 58 | Temp 97.1°F | Resp 18 | Ht 71.0 in | Wt 189.1 lb

## 2020-11-24 DIAGNOSIS — M549 Dorsalgia, unspecified: Secondary | ICD-10-CM | POA: Diagnosis not present

## 2020-11-24 DIAGNOSIS — R0600 Dyspnea, unspecified: Secondary | ICD-10-CM | POA: Diagnosis not present

## 2020-11-24 DIAGNOSIS — Z881 Allergy status to other antibiotic agents status: Secondary | ICD-10-CM | POA: Diagnosis not present

## 2020-11-24 DIAGNOSIS — T8781 Dehiscence of amputation stump: Secondary | ICD-10-CM | POA: Diagnosis not present

## 2020-11-24 DIAGNOSIS — I2699 Other pulmonary embolism without acute cor pulmonale: Secondary | ICD-10-CM | POA: Insufficient documentation

## 2020-11-24 DIAGNOSIS — F1290 Cannabis use, unspecified, uncomplicated: Secondary | ICD-10-CM | POA: Insufficient documentation

## 2020-11-24 DIAGNOSIS — F17210 Nicotine dependence, cigarettes, uncomplicated: Secondary | ICD-10-CM | POA: Diagnosis not present

## 2020-11-24 DIAGNOSIS — F17220 Nicotine dependence, chewing tobacco, uncomplicated: Secondary | ICD-10-CM | POA: Diagnosis not present

## 2020-11-24 DIAGNOSIS — F1990 Other psychoactive substance use, unspecified, uncomplicated: Secondary | ICD-10-CM | POA: Insufficient documentation

## 2020-11-24 DIAGNOSIS — M79605 Pain in left leg: Secondary | ICD-10-CM | POA: Diagnosis not present

## 2020-11-24 DIAGNOSIS — F1490 Cocaine use, unspecified, uncomplicated: Secondary | ICD-10-CM | POA: Diagnosis not present

## 2020-11-24 DIAGNOSIS — R042 Hemoptysis: Secondary | ICD-10-CM | POA: Diagnosis not present

## 2020-11-24 DIAGNOSIS — M7989 Other specified soft tissue disorders: Secondary | ICD-10-CM | POA: Diagnosis not present

## 2020-11-24 DIAGNOSIS — Z86718 Personal history of other venous thrombosis and embolism: Secondary | ICD-10-CM | POA: Insufficient documentation

## 2020-11-24 DIAGNOSIS — Z7289 Other problems related to lifestyle: Secondary | ICD-10-CM | POA: Diagnosis not present

## 2020-11-24 DIAGNOSIS — Y835 Amputation of limb(s) as the cause of abnormal reaction of the patient, or of later complication, without mention of misadventure at the time of the procedure: Secondary | ICD-10-CM | POA: Diagnosis not present

## 2020-11-24 DIAGNOSIS — I824Y2 Acute embolism and thrombosis of unspecified deep veins of left proximal lower extremity: Secondary | ICD-10-CM

## 2020-11-24 DIAGNOSIS — I82412 Acute embolism and thrombosis of left femoral vein: Secondary | ICD-10-CM | POA: Diagnosis not present

## 2020-11-24 DIAGNOSIS — L97522 Non-pressure chronic ulcer of other part of left foot with fat layer exposed: Secondary | ICD-10-CM | POA: Diagnosis not present

## 2020-11-24 MED ORDER — ENOXAPARIN SODIUM 80 MG/0.8ML ~~LOC~~ SOLN
80.0000 mg | Freq: Two times a day (BID) | SUBCUTANEOUS | 0 refills | Status: DC
  Filled 2020-11-24: qty 16, 10d supply, fill #0

## 2020-11-24 NOTE — Progress Notes (Signed)
Brian, Lee ([id number]) Visit Report for 11/24/2020 Abuse/Suicide Risk Screen Details Patient Name: [name], [name]. Date of Service: 11/24/2020 1:00 PM Medical Record Number: [id number] Patient Account Number: [id number] Date of Birth/Sex: 06/08/1991 (30 y.o. M) Treating RN: Hansel Feinstein Primary Care Lyly Canizales: Lilyan Punt Other Clinician: Lolita Cram Referring Masaru Chamberlin: Lilyan Punt Treating Hillel Card/Extender: Altamese [place] in Treatment: 0 Abuse/Suicide Risk Screen Items Answer ABUSE RISK SCREEN: Has anyone close to you tried to hurt or harm you recentlyo No Do you feel uncomfortable with anyone in your familyo No Has anyone forced you do things that you didnot want to doo No Electronic Signature(s) Signed: 11/24/2020 4:51:02 PM By: Hansel Feinstein Entered By: Hansel Feinstein on 11/24/2020 13:04:54 [name] ([id number]) -------------------------------------------------------------------------------- Activities of Daily Living Details Patient Name: Brian, HOCKER Lee. Date of Service: 11/24/2020 1:00 PM Medical Record Number: [id number] Patient Account Number: [id number] Date of Birth/Sex: 1991/06/20 (30 y.o. M) Treating RN: Hansel Feinstein Primary Care Ariyan Sinnett: Lilyan Punt Other Clinician: Lolita Cram Referring Tida Saner: Lilyan Punt Treating Keitha Kolk/Extender: Altamese [place] in Treatment: 0 Activities of Daily Living Items Answer Activities of Daily Living (Please select one for each item) Drive Automobile Not Able Take Medications Completely Able Use Telephone Completely Able Care for Appearance Completely Able Use Toilet Completely Able Bath / Shower Completely Able Dress Self Completely Able Feed Self Completely Able Walk Completely Able Get In / Out Bed Completely Able Housework Completely Able Prepare Meals Completely Able Handle Money Completely Able Shop for Self Completely Able Electronic Signature(s) Signed: 11/24/2020 4:51:02 PM By:  Hansel Feinstein Entered By: Hansel Feinstein on 11/24/2020 13:05:22 [name] ([id number]) -------------------------------------------------------------------------------- Education Screening Details Patient Name: Brian Hams Lee. Date of Service: 11/24/2020 1:00 PM Medical Record Number: [id number] Patient Account Number: [id number] Date of Birth/Sex: 1990/12/07 (30 y.o. M) Treating RN: Hansel Feinstein Primary Care Deion Forgue: Lilyan Punt Other Clinician: Lolita Cram Referring Calia Napp: Lilyan Punt Treating Andray Assefa/Extender: Altamese [place] in Treatment: 0 Primary Learner Assessed: Patient Learning Preferences/Education Level/Primary Language Learning Preference: Explanation Highest Education Level: College or Above Preferred Language: English Cognitive Barrier Language Barrier: No Translator Needed: No Memory Deficit: No Emotional Barrier: No Physical Barrier Impaired Vision: No Impaired Hearing: No Decreased Hand dexterity: No Knowledge/Comprehension Knowledge Level: Medium Comprehension Level: Medium Ability to understand written instructions: Medium Ability to understand verbal instructions: Medium Motivation Anxiety Level: Calm Cooperation: Cooperative Education Importance: Acknowledges Need Interest in Health Problems: Asks Questions Perception: Coherent Willingness to Engage in Self-Management High Activities: Readiness to Engage in Self-Management High Activities: Electronic Signature(s) Signed: 11/24/2020 4:51:02 PM By: Hansel Feinstein Entered By: Hansel Feinstein on 11/24/2020 13:05:54 [name] ([id number]) -------------------------------------------------------------------------------- Fall Risk Assessment Details Patient Name: [name]. Date of Service: 11/24/2020 1:00 PM Medical Record Number: [id number] Patient Account Number: [id number] Date of Birth/Sex: 07/21/1991 (30 y.o. M) Treating RN: Hansel Feinstein Primary Care Laquonda Welby: Lilyan Punt Other Clinician: Lolita Cram Referring Vonette Grosso: Lilyan Punt Treating Kayzen Kendzierski/Extender: Altamese [place] in Treatment: 0 Fall Risk Assessment Items Have you had 2 or more falls in the last 12 monthso 0 Yes Have you had any fall that resulted in injury in the last 12 monthso 0 No FALLS RISK SCREEN History of falling - immediate or within 3 months 25 Yes Secondary diagnosis (Do you have 2 or more medical diagnoseso) 0 No Ambulatory aid None/bed rest/wheelchair/nurse 0 Yes Crutches/cane/Picinich 15 Yes Furniture 0 No Intravenous therapy Access/Saline/Heparin Lock 0 No Gait/Transferring Normal/ bed rest/ wheelchair 0 No Weak (  short steps with or without shuffle, stooped but able to lift head while walking, may 10 Yes seek support from furniture) Impaired (short steps with shuffle, may have difficulty arising from chair, head down, impaired 0 No balance) Mental Status Oriented to own ability 0 Yes Electronic Signature(s) Signed: 11/24/2020 4:51:02 PM By: Hansel Feinstein Entered By: Hansel Feinstein on 11/24/2020 13:06:39 [name] ([id number]) -------------------------------------------------------------------------------- Foot Assessment Details Patient Name: Brian Hams Lee. Date of Service: 11/24/2020 1:00 PM Medical Record Number: [id number] Patient Account Number: [id number] Date of Birth/Sex: 1990/09/29 (30 y.o. M) Treating RN: Hansel Feinstein Primary Care Mirta Mally: Lilyan Punt Other Clinician: Lolita Cram Referring Averill Winters: Lilyan Punt Treating Aviyah Swetz/Extender: Altamese [place] in Treatment: 0 Foot Assessment Items Site Locations + = Sensation present, - = Sensation absent, C = Callus, U = Ulcer R = Redness, W = Warmth, M = Maceration, PU = Pre-ulcerative lesion F = Fissure, S = Swelling, D = Dryness Assessment Right: Left: Other Deformity: No No Prior Foot Ulcer: No No Prior Amputation: No Yes Charcot Joint: No No Ambulatory Status:  Ambulatory With Help Assistance Device: Crutches Gait: Steady Notes 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 Electronic Signature(s) Signed: 11/24/2020 4:51:02 PM By: Hansel Feinstein Entered By: Hansel Feinstein on 11/24/2020 13:07:48 [name] ([id number]) -------------------------------------------------------------------------------- Nutrition Risk Screening Details Patient Name: Brian Hams Lee. Date of Service: 11/24/2020 1:00 PM Medical Record Number: [id number] Patient Account Number: [id number] Date of Birth/Sex: 1990/08/29 (30 y.o. M) Treating RN: Hansel Feinstein Primary Care Colbi Schiltz: Lilyan Punt Other Clinician: Lolita Cram Referring Leibish Mcgregor: Lilyan Punt Treating Krystofer Hevener/Extender: Altamese [place] in Treatment: 0 Height (in): 72 Weight (lbs): 185 Body Mass Index (BMI): 25.1 Nutrition Risk Screening Items Score Screening NUTRITION RISK SCREEN: I have an illness or condition that made me change the kind and/or amount of food I eat 0 No I eat fewer than two meals per day 0 No I eat few fruits and vegetables, or milk products 2 Yes I have three or more drinks of beer, liquor or wine almost every day 0 No I have tooth or mouth problems that make it hard for me to eat 0 No I don't always have enough money to buy the food I need 0 No I eat alone most of the time 0 No I take three or more different prescribed or over-the-counter drugs Lee day 0 No Without wanting to, I have lost or gained 10 pounds in the last six months 0 No I am not always physically able to shop, cook and/or feed myself 0 No Nutrition Protocols Good Risk Protocol Moderate Risk Protocol High Risk Proctocol Risk Level: Good Risk Score: 2 Electronic Signature(s) Signed: 11/24/2020 4:51:02 PM By: Hansel Feinstein Entered ByHansel Feinstein on 11/24/2020 13:07:04
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
## 2020-11-24 NOTE — Patient Instructions (Addendum)
[facility] at [facility] Discharge Instructions  You were seen today by Dr. Ellin Saba and Rojelio Brenner PA-C for your DVT (blood clots in leg) and pulmonary embolism (blood clots in lungs).    You remain at high risk for blood clots while you continue to have surgeries and are not back at your full mobility.  Therefore, you will need to continue to take Eliquis for the immediate future.  You should not stop your Eliquis for any reason , except in the circumstance described below.  In order to facilitate your surgery in May, we will temporarily prescribe a blood-thinner shot (called Lovenox). You will need to discuss this with your surgeon as well, but you will likely stop your Eliquis the week before surgery, and start taking the Lovenox shots instead.  You will not take Lovenox the morning of your surgery, but should resume Lovenox within 1-2 days after your surgery, at the discretion of your surgeon.  When you are discharged home from the hospital after your surgery, you will start taking Eliquis again instead of the Lovenox).   After you have been sent home from your surgery, you will continue to take Eliquis.  We will see you in July 2022 to see how you are doing and determine if you can come off of the Eliquis.  MEDICATIONS: Prescription has been sent for Lovenox shots, which should be used before your surgery in May, as described above.  FOLLOW-UP APPOINTMENT: Follow up in July 2022 (3 months from today)  Thank you for choosing [facility] to provide your oncology and hematology care.  To afford each patient quality time with our provider, please arrive at least 15 minutes before your scheduled appointment time.   If you have a lab appointment with the Cancer Center please come in thru the Main Entrance and check in at the main information desk.  You need to re-schedule your appointment should you arrive 10 or more minutes  late.  We strive to give you quality time with our providers, and arriving late affects you and other patients whose appointments are after yours.  Also, if you no show three or more times for appointments you may be dismissed from the clinic at the providers discretion.     Again, thank you for choosing [facility].  Our hope is that these requests will decrease the amount of time that you wait before being seen by our physicians.       _____________________________________________________________  Should you have questions after your visit to [facility], please contact our office at [phone] and follow the prompts.  Our office hours are 8:00 a.m. and 4:30 p.m. Monday - Friday.  Please note that voicemails left after 4:00 p.m. may not be returned until the following business day.  We are closed weekends and major holidays.  You do have access to a nurse 24-7, just call the main number to the clinic [phone] and do not press any options, hold on the line and a nurse will answer the phone.    For prescription refill requests, have your pharmacy contact our office and allow 72 hours.    Due to Covid, you will need to wear a mask upon entering the hospital. If you do not have a mask, a mask will be given to you at the Main Entrance upon arrival. For doctor visits, patients may have 1 support person age 57 or older  with them. For treatment visits, patients can not have anyone with them due to social distancing guidelines and our immunocompromised population.

## 2020-11-25 NOTE — Progress Notes (Signed)
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 ([id number]) -------------------------------------------------------------------------------- SuperBill Details Patient Name: Brian Hams A. Date of Service: 11/24/2020 Medical Record Number: [id number] Patient Account Number: [id number] Date of Birth/Sex: Jun 07, 1991 (30 y.o. 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 T81.31XD Disruption of external operation (surgical) wound, not elsewhere classified, subsequent encounter Facility Procedures CPT4 Code: [id number] Description: 99213 - WOUND CARE VISIT-LEV 3 EST PT Modifier: Quantity: 1 CPT4 Code: [id number] Description: 11043 - DEB MUSC/FASCIA 20 SQ CM/< Modifier: Quantity: 1 CPT4 Code: Description: ICD-10 Diagnosis Description L97.528 Non-pressure chronic ulcer of other part of left foot with other specified Modifier: severity Quantity: Physician Procedures CPT4 Code Description: [id number] 62130 - WC PHYS LEVEL 4 - NEW PT Modifier: 25 Quantity: 1 CPT4 Code Description: ICD-10 Diagnosis Description L97.528 Non-pressure chronic ulcer of other part of left foot with other specified sev L97.521  Non-pressure chronic ulcer of other part of left foot limited to breakdown of T81.31XD Disruption of  external operation (surgical) wound, not elsewhere classified, s Modifier: erity skin ubsequent enco Quantity: unter CPT4 Code Description: [id number] 11043 - WC PHYS DEBR MUSCLE/FASCIA 20 SQ CM Modifier: Quantity: 1 CPT4 Code Description: ICD-10 Diagnosis Description L97.528 Non-pressure chronic ulcer of other part of left foot with other specified sev Modifier: erity Quantity: Electronic Signature(s) Signed: 11/25/2020 7:59:07 AM By: Baltazar Najjar MD Entered By: Baltazar Najjar on 11/24/2020 14:10:58

## 2020-11-25 NOTE — Progress Notes (Signed)
CLERANCE, UMLAND ([id number]) Visit Report for 11/24/2020 Allergy List Details Patient Name: [name], [name]. Date of Service: 11/24/2020 1:00 PM Medical Record Number: [id number] Patient Account Number: [id number] Date of Birth/Sex: 1991-07-19 (29 y.o. M) Treating RN: Hansel Feinstein Primary Care Kaydenn Mclear: Lilyan Punt Other Clinician: Lolita Cram Referring Oyuki Hogan: Lilyan Punt Treating Ananya Mccleese/Extender: Maxwell Caul Weeks in Treatment: 0 Allergies Active Allergies vancomycin Reaction: high fever Severity: Severe Allergy Notes Electronic Signature(s) Signed: 11/24/2020 4:51:02 PM By: Hansel Feinstein Entered By: Hansel Feinstein on 11/24/2020 12:59:05 [name] ([id number]) -------------------------------------------------------------------------------- Arrival Information Details Patient Name: [name] Date of Service: 11/24/2020 1:00 PM Medical Record Number: [id number] Patient Account Number: [id number] Date of Birth/Sex: Jul 19, 1991 (29 y.o. M) Treating RN: Hansel Feinstein Primary Care Kandace Elrod: Lilyan Punt Other Clinician: Lolita Cram Referring Annastasia Haskins: Lilyan Punt Treating Keondrick Dilks/Extender: Altamese [place] in Treatment: 0 Visit Information Patient Arrived: Wheel Chair Arrival Time: 12:51 Accompanied By: mom Transfer Assistance: None Patient Identification Verified: Yes Secondary Verification Process Completed: Yes Patient Has Alerts: Yes Patient Alerts: Patient on Blood Thinner Eliquis NOT DIABETIC Electronic Signature(s) Signed: 11/24/2020 4:51:02 PM By: Hansel Feinstein Entered By: Hansel Feinstein on 11/24/2020 13:04:28 [name] ([id number]) -------------------------------------------------------------------------------- Clinic Level of Care Assessment Details Patient Name: [name]. Date of Service: 11/24/2020 1:00 PM Medical Record Number: [id number] Patient Account Number: [id number] Date of Birth/Sex: 01-19-1991 (29 y.o. M) Treating  RN: Huel Coventry Primary Care Athan Casalino: Lilyan Punt Other Clinician: Lolita Cram Referring [place]: Lilyan Punt Treating Alda Gaultney/Extender: Altamese [place] in Treatment: 0 Clinic Level of Care Assessment Items TOOL 1 Quantity Score  - Use when EandM and Procedure is performed on INITIAL visit 0 ASSESSMENTS - Nursing Assessment / Reassessment X - General Physical Exam (combine w/ comprehensive assessment (listed just below) when performed on new 1 20 pt. evals) X- 1 25 Comprehensive Assessment (HX, ROS, Risk Assessments, Wounds Hx, etc.) ASSESSMENTS - Wound and Skin Assessment / Reassessment  - Dermatologic / Skin Assessment (not related to wound area) 0 ASSESSMENTS - Ostomy and/or Continence Assessment and Care  - Incontinence Assessment and Management 0  - 0 Ostomy Care Assessment and Management (repouching, etc.) PROCESS - Coordination of Care  - Simple Patient / Family Education for ongoing care 0 X- 1 20 Complex (extensive) Patient / Family Education for ongoing care X- 1 10 Staff obtains Consents, Records, Test Results / Process Orders  - 0 Staff telephones HHA, Nursing Homes / Clarify orders / etc  - 0 Routine Transfer to another Facility (non-emergent condition)  - 0 Routine Hospital Admission (non-emergent condition) X- 1 15 New Admissions / Manufacturing engineer / Ordering NPWT, Apligraf, etc.  - 0 Emergency Hospital Admission (emergent condition) PROCESS - Special Needs  - Pediatric / Minor Patient Management 0  - 0 Isolation Patient Management  - 0 Hearing / Language / Visual special needs  - 0 Assessment of Community assistance (transportation, D/C planning, etc.)  - 0 Additional assistance / Altered mentation  - 0 Support Surface(s) Assessment (bed, cushion, seat, etc.) INTERVENTIONS - Miscellaneous  - External ear exam 0  - 0 Patient Transfer (multiple staff / Nurse, adult / Similar devices)  -  0 Simple Staple / Suture removal (25 or less)  - 0 Complex Staple / Suture removal (26 or more)  - 0 Hypo/Hyperglycemic Management (do not check if billed separately)  - 0 Ankle / Brachial Index (ABI) - do not check if billed separately Has the patient been seen at the  hospital within the last three years: Yes Total Score: 90 Level Of Care: New/Established - Level 3 Verna CzechWALKER, Leverett A. ([id number]) Electronic Signature(s) Signed: 11/24/2020 5:25:07 PM By: Elliot GurneyWoody, BSN, RN, CWS, Kim RN, BSN Entered By: Elliot GurneyWoody, BSN, RN, CWS, Kim on 11/24/2020 14:08:21 Verna CzechWALKER, Kardell A. ([id number]) -------------------------------------------------------------------------------- Encounter Discharge Information Details Patient Name: [name], Kaushal A. Date of Service: 11/24/2020 1:00 PM Medical Record Number: [id number] Patient Account Number: [id number] Date of Birth/Sex: 07/05/1991 (29 y.o. M) Treating RN: Huel CoventryWoody, Kim Primary Care Amauri Medellin: Lilyan PuntLUKING, SCOTT Other Clinician: Lolita CramBurnette, Kyara Referring Juleon Narang: Lilyan PuntLUKING, SCOTT Treating Less Woolsey/Extender: Altamese CarolinaOBSON, [name] G Weeks in Treatment: 0 Encounter Discharge Information Items Post Procedure Vitals Discharge Condition: Stable Unable to obtain vitals Reason: . Ambulatory Status: Wheelchair Discharge Destination: Home Transportation: Private Auto Accompanied By: self Schedule Follow-up Appointment: Yes Clinical Summary of Care: Electronic Signature(s) Signed: 11/24/2020 5:25:07 PM By: Elliot GurneyWoody, BSN, RN, CWS, Kim RN, BSN Entered By: Elliot GurneyWoody, BSN, RN, CWS, Kim on 11/24/2020 14:09:24 Verna CzechWALKER, Ishaaq A. ([id number]) -------------------------------------------------------------------------------- Lower Extremity Assessment Details Patient Name: [name], Elison A. Date of Service: 11/24/2020 1:00 PM Medical Record Number: [id number] Patient Account Number: [id number] Date of Birth/Sex: 07/26/1991 (29 y.o. M) Treating RN: Huel CoventryWoody, Kim Primary Care Deloria Brassfield: Lilyan PuntLUKING,  SCOTT Other Clinician: Lolita CramBurnette, Kyara Referring Nikolina Simerson: Lilyan PuntLUKING, SCOTT Treating Terree Gaultney/Extender: Altamese CarolinaOBSON, [name] G Weeks in Treatment: 0 Vascular Assessment Pulses: Popliteal Palpable: [Left:Yes] Notes Patient's foot is warm. Unable to palpate pulses due to trauma to the foot. Electronic Signature(s) Signed: 11/24/2020 5:25:07 PM By: Elliot GurneyWoody, BSN, RN, CWS, Kim RN, BSN Entered By: Elliot GurneyWoody, BSN, RN, CWS, Kim on 11/24/2020 13:39:51 Verna CzechWALKER, Montoya A. ([id number]) -------------------------------------------------------------------------------- Multi Wound Chart Details Patient Name: [name], Pace A. Date of Service: 11/24/2020 1:00 PM Medical Record Number: [id number] Patient Account Number: [id number] Date of Birth/Sex: 12/29/1990 (29 y.o. M) Treating RN: Huel CoventryWoody, Kim Primary Care Shayley Medlin: Lilyan PuntLUKING, SCOTT Other Clinician: Lolita CramBurnette, Kyara Referring Joory Gough: Lilyan PuntLUKING, SCOTT Treating Rakhi Romagnoli/Extender: Altamese CarolinaOBSON, [name] G Weeks in Treatment: 0 Vital Signs Height(in): 72 Pulse(bpm): 61 Weight(lbs): 185 Blood Pressure(mmHg): 128/80 Body Mass Index(BMI): 25 Temperature(F): 98 Respiratory Rate(breaths/min): 16 Photos: Wound Location: Left Foot Left, Lateral, Dorsal Foot Left, Medial Foot Wounding Event: Surgical Injury Surgical Injury Surgical Injury Primary Etiology: Trauma, Other Trauma, Other Trauma, Other Comorbid History: Neuropathy Neuropathy Neuropathy Date Acquired: 06/04/2020 05/31/2020 05/27/2020 Weeks of Treatment: 0 0 0 Wound Status: Open Open Open Measurements L x W x D (cm) 1.7x1.6x0.4 3x1.8x0.2 6.7x0.5x0.1 Area (cm) : 2.136 4.241 2.631 Volume (cm) : 0.855 0.848 0.263 % Reduction in Area: 0.00% 0.00% 0.00% % Reduction in Volume: 0.00% 0.00% 0.00% Classification: Full Thickness Without Exposed Full Thickness Without Exposed Full Thickness Without Exposed Support Structures Support Structures Support Structures Exudate Amount: Medium Medium None Present Exudate Type:  Serosanguineous Serosanguineous N/A Exudate Color: red, brown red, brown N/A Granulation Amount: Medium (34-66%) Small (1-33%) None Present (0%) Granulation Quality: N/A Pink N/A Necrotic Amount: Medium (34-66%) Large (67-100%) Large (67-100%) Necrotic Tissue: Adherent Slough Adherent Slough Eschar Exposed Structures: Fat Layer (Subcutaneous Tissue): Fat Layer (Subcutaneous Tissue): Fat Layer (Subcutaneous Tissue): Yes Yes Yes Fascia: No Fascia: No Fascia: No Tendon: No Tendon: No Tendon: No Muscle: No Muscle: No Muscle: No Joint: No Joint: No Joint: No Bone: No Bone: No Bone: No Epithelialization: N/A N/A None Debridement: Debridement - Excisional N/A N/A Pre-procedure Verification/Time 13:43 N/A N/A Out Taken: Verna CzechWALKER, Ahmaud A. ([id number]) Tissue Debrided: Tendon, Subcutaneous, Slough N/A N/A Level: Skin/Subcutaneous Tissue/Muscle N/A N/A Debridement Area (sq cm): 2.72 N/A N/A Instrument: Curette N/A N/A Bleeding: Minimum N/A  N/A Hemostasis Achieved: Pressure N/A N/A Debridement Treatment Procedure was tolerated well N/A N/A Response: Post Debridement 1.7x1.6x0.5 N/A N/A Measurements L x W x D (cm) Post Debridement Volume: 1.068 N/A N/A (cm) Assessment Notes: left anterior midline N/A N/A Procedures Performed: Debridement N/A N/A Treatment Notes Wound #1 (Foot) Wound Laterality: Left Cleanser Peri-Wound Care Topical Primary Dressing Prisma 4.34 (in) Discharge Instruction: Moisten w/normal saline or sterile water; Cover wound as directed. Do not remove from wound bed. Secondary Dressing ABD Pad 5x9 (in/in) Discharge Instruction: Cover with ABD pad Secured With 22M Medipore H Soft Cloth Surgical Tape, 2x2 (in/yd) Coban Cohesive Bandage 4x5 (yds) Stretched Discharge Instruction: Apply Coban as directed. Compression Wrap Compression Stockings Add-Ons Wound #2 (Foot) Wound Laterality: Dorsal, Left, Lateral Cleanser Peri-Wound Care Topical Primary  Dressing Prisma 4.34 (in) Discharge Instruction: Moisten w/normal saline or sterile water; Cover wound as directed. Do not remove from wound bed. Secondary Dressing ABD Pad 5x9 (in/in) Discharge Instruction: Cover with ABD pad Secured With 22M Medipore H Soft Cloth Surgical Tape, 2x2 (in/yd) Coban Cohesive Bandage 4x5 (yds) Stretched Discharge Instruction: Apply Coban as directed. Compression Wrap Compression Stockings SHAN, VALDES A. ([id number]) Add-Ons Wound #3 (Foot) Wound Laterality: Left, Medial Cleanser Peri-Wound Care Topical Primary Dressing Prisma 4.34 (in) Discharge Instruction: Moisten w/normal saline or sterile water; Cover wound as directed. Do not remove from wound bed. Secondary Dressing ABD Pad 5x9 (in/in) Discharge Instruction: Cover with ABD pad Secured With 22M Medipore H Soft Cloth Surgical Tape, 2x2 (in/yd) Coban Cohesive Bandage 4x5 (yds) Stretched Discharge Instruction: Apply Coban as directed. Compression Wrap Compression Stockings Add-Ons Electronic Signature(s) Signed: 11/24/2020 6:32:07 PM By: Elliot Gurney, BSN, RN, CWS, Kim RN, BSN Entered By: Elliot Gurney, BSN, RN, CWS, Kim on 11/24/2020 17:26:42 [name] ([id number]) -------------------------------------------------------------------------------- Multi-Disciplinary Care Plan Details Patient Name: SHRIYAN, ARAKAWA A. Date of Service: 11/24/2020 1:00 PM Medical Record Number: [id number] Patient Account Number: [id number] Date of Birth/Sex: Jan 05, 1991 (29 y.o. M) Treating RN: Huel Coventry Primary Care Artis Buechele: Lilyan Punt Other Clinician: Lolita Cram Referring Cinthia Rodden: Lilyan Punt Treating Shakenya Stoneberg/Extender: Altamese [place] in Treatment: 0 Active Inactive Orientation to the Wound Care Program Nursing Diagnoses: Knowledge deficit related to the wound healing center program Goals: Patient/caregiver will verbalize understanding of the Wound Healing Center Program Date Initiated:  11/24/2020 Target Resolution Date: 11/24/2020 Goal Status: Active Interventions: Provide education on orientation to the wound center Notes: Peripheral Neuropathy Nursing Diagnoses: Knowledge deficit related to disease process and management of peripheral neurovascular dysfunction Goals: Patient/caregiver will verbalize understanding of disease process and disease management Date Initiated: 11/24/2020 Target Resolution Date: 12/01/2020 Goal Status: Active Interventions: Assess signs and symptoms of neuropathy upon admission and as needed Notes: Wound/Skin Impairment Nursing Diagnoses: Impaired tissue integrity Goals: Patient/caregiver will verbalize understanding of skin care regimen Date Initiated: 11/24/2020 Target Resolution Date: 11/24/2020 Goal Status: Active Interventions: Assess patient/caregiver ability to obtain necessary supplies Assess ulceration(s) every visit Treatment Activities: Skin care regimen initiated : 11/24/2020 Topical wound management initiated : 11/24/2020 Notes: Electronic Signature(s) Signed: 11/24/2020 5:25:07 PM By: Elliot Gurney, BSN, RN, CWS, Kim RN, BSN Entered By: Elliot Gurney, BSN, RN, CWS, Kim on 11/24/2020 13:42:51 [name] ([id number]) Dan Humphreys, Demetric Mervyn Skeeters ([id number]) -------------------------------------------------------------------------------- Pain Assessment Details Patient Name: [name] Date of Service: 11/24/2020 1:00 PM Medical Record Number: [id number] Patient Account Number: [id number] Date of Birth/Sex: 03-Jul-1991 (29 y.o. M) Treating RN: Hansel Feinstein Primary Care Dontay Harm: Lilyan Punt Other Clinician: Lolita Cram Referring Thessaly Mccullers: Lilyan Punt Treating Collins Kerby/Extender: Altamese [place] in  Treatment: 0 Active Problems Location of Pain Severity and Description of Pain Patient Has Paino Yes Site Locations Pain Location: Pain in Ulcers Rate the pain. Current Pain Level: 2 Pain Management and Medication Current Pain  Management: Electronic Signature(s) Signed: 11/24/2020 4:51:02 PM By: Hansel Feinstein Entered By: Hansel Feinstein on 11/24/2020 12:52:30 [name] ([id number]) -------------------------------------------------------------------------------- Patient/Caregiver Education Details Patient Name: [name] Date of Service: 11/24/2020 1:00 PM Medical Record Number: [id number] Patient Account Number: [id number] Date of Birth/Gender: [birth year] (29 y.o. M) Treating RN: Huel Coventry Primary Care Physician: Lilyan Punt Other Clinician: Lolita Cram Referring Physician: Lilyan Punt Treating Physician/Extender: Altamese [place] in Treatment: 0 Education Assessment Education Provided To: Patient Education Topics Provided Welcome To The Wound Care Center: Handouts: Welcome To The Wound Care Center Methods: Demonstration, Explain/Verbal Responses: State content correctly Wound Debridement: Handouts: Wound Debridement Methods: Demonstration, Explain/Verbal Responses: State content correctly Wound/Skin Impairment: Handouts: Caring for Your Ulcer Methods: Demonstration, Explain/Verbal Responses: State content correctly Electronic Signature(s) Signed: 11/24/2020 5:25:07 PM By: Elliot Gurney, BSN, RN, CWS, Kim RN, BSN Entered By: Elliot Gurney, BSN, RN, CWS, Kim on 11/24/2020 14:08:49 [name] ([id number]) -------------------------------------------------------------------------------- Wound Assessment Details Patient Name: [name] Date of Service: 11/24/2020 1:00 PM Medical Record Number: [id number] Patient Account Number: [id number] Date of Birth/Sex: [birth year] (29 y.o. M) Treating RN: Hansel Feinstein Primary Care Maricel Swartzendruber: Lilyan Punt Other Clinician: Lolita Cram Referring Lasharn Bufkin: Lilyan Punt Treating Mohini Heathcock/Extender: Altamese  in Treatment: 0 Wound Status Wound Number: 1 Primary Etiology: Trauma, Other Wound Location: Left Foot Wound Status:  Open Wounding Event: Surgical Injury Comorbid History: Neuropathy Date Acquired: 06/04/2020 Weeks Of Treatment: 0 Clustered Wound: No Photos Wound Measurements Length: (cm) 1.7 Width: (cm) 1.6 Depth: (cm) 0.4 Area: (cm) 2.136 Volume: (cm) 0.855 % Reduction in Area: 0% % Reduction in Volume: 0% Wound Description Classification: Full Thickness Without Exposed Support St Exudate Amount: Medium Exudate Type: Serosanguineous Exudate Color: red, brown ructures Foul Odor After Cleansing: No Slough/Fibrino Yes Wound Bed Granulation Amount: Medium (34-66%) Exposed Structure Necrotic Amount: Medium (34-66%) Fascia Exposed: No Necrotic Quality: Adherent Slough Fat Layer (Subcutaneous Tissue) Exposed: Yes Tendon Exposed: No Muscle Exposed: No Joint Exposed: No Bone Exposed: No Assessment Notes left anterior midline Treatment Notes Wound #1 (Foot) Wound Laterality: Left Cleanser Peri-Wound Care ELIODORO, GULLETT A. ([id number]) Topical Primary Dressing Prisma 4.34 (in) Discharge Instruction: Moisten w/normal saline or sterile water; Cover wound as directed. Do not remove from wound bed. Secondary Dressing ABD Pad 5x9 (in/in) Discharge Instruction: Cover with ABD pad Secured With 90M Medipore H Soft Cloth Surgical Tape, 2x2 (in/yd) Coban Cohesive Bandage 4x5 (yds) Stretched Discharge Instruction: Apply Coban as directed. Compression Wrap Compression Stockings Add-Ons Electronic Signature(s) Signed: 11/24/2020 6:32:07 PM By: Elliot Gurney, BSN, RN, CWS, Kim RN, BSN Signed: 11/25/2020 5:09:19 PM By: Hansel Feinstein Previous Signature: 11/24/2020 4:51:02 PM Version By: Hansel Feinstein Entered By: Elliot Gurney BSN, RN, CWS, Kim on 11/24/2020 17:26:01 [name] ([id number]) -------------------------------------------------------------------------------- Wound Assessment Details Patient Name: QUINDARRIUS, JOPLIN A. Date of Service: 11/24/2020 1:00 PM Medical Record Number: [id number] Patient Account  Number: [id number] Date of Birth/Sex: July 21, 1991 (29 y.o. M) Treating RN: Hansel Feinstein Primary Care Julaine Zimny: Lilyan Punt Other Clinician: Lolita Cram Referring Daliya Parchment: Lilyan Punt Treating Delawrence Fridman/Extender: Maxwell Caul Weeks in Treatment: 0 Wound Status Wound Number: 2 Primary Etiology: Trauma, Other Wound Location: Left, Lateral, Dorsal Foot Wound Status: Open Wounding Event: Surgical Injury Comorbid History: Neuropathy Date Acquired: 05/31/2020 Weeks Of Treatment: 0 Clustered Wound: No Photos  Wound Measurements Length: (cm) 3 Width: (cm) 1.8 Depth: (cm) 0.2 Area: (cm) 4.241 Volume: (cm) 0.848 % Reduction in Area: 0% % Reduction in Volume: 0% Tunneling: No Undermining: No Wound Description Classification: Full Thickness Without Exposed Support Structu Exudate Amount: Medium Exudate Type: Serosanguineous Exudate Color: red, brown res Foul Odor After Cleansing: No Slough/Fibrino Yes Wound Bed Granulation Amount: Small (1-33%) Exposed Structure Granulation Quality: Pink Fascia Exposed: No Necrotic Amount: Large (67-100%) Fat Layer (Subcutaneous Tissue) Exposed: Yes Necrotic Quality: Adherent Slough Tendon Exposed: No Muscle Exposed: No Joint Exposed: No Bone Exposed: No Treatment Notes Wound #2 (Foot) Wound Laterality: Dorsal, Left, Lateral Cleanser Peri-Wound Care Topical Primary Dressing [place]AK[place], MARKGRAF A. ([id number]) Prisma 4.34 (in) Discharge Instruction: Moisten w/normal saline or sterile water; Cover wound as directed. Do not remove from wound bed. Secondary Dressing ABD Pad 5x9 (in/in) Discharge Instruction: Cover with ABD pad Secured With 11M Medipore H Soft Cloth Surgical Tape, 2x2 (in/yd) Coban Cohesive Bandage 4x5 (yds) Stretched Discharge Instruction: Apply Coban as directed. Compression Wrap Compression Stockings Add-Ons Electronic Signature(s) Signed: 11/24/2020 5:25:07 PM By: Elliot Gurney, BSN, RN, CWS, Kim RN, BSN Signed: 11/25/2020  5:09:19 PM By: Hansel Feinstein Previous Signature: 11/24/2020 4:51:02 PM Version By: Hansel Feinstein Entered By: Elliot Gurney BSN, RN, CWS, Kim on 11/24/2020 17:24:28 [name] ([id number]) -------------------------------------------------------------------------------- Wound Assessment Details Patient Name: NYLES, MITTON A. Date of Service: 11/24/2020 1:00 PM Medical Record Number: [id number] Patient Account Number: [id number] Date of Birth/Sex: Feb 09, 1991 (29 y.o. M) Treating RN: Hansel Feinstein Primary Care Keeghan Mcintire: Lilyan Punt Other Clinician: Lolita Cram Referring Jeane Cashatt: Lilyan Punt Treating Jazari Ober/Extender: Altamese [place] in Treatment: 0 Wound Status Wound Number: 3 Primary Etiology: Trauma, Other Wound Location: Left, Medial Foot Wound Status: Open Wounding Event: Surgical Injury Comorbid History: Neuropathy Date Acquired: 05/27/2020 Weeks Of Treatment: 0 Clustered Wound: No Photos Wound Measurements Length: (cm) 6.7 Width: (cm) 0.5 Depth: (cm) 0.1 Area: (cm) 2.631 Volume: (cm) 0.263 % Reduction in Area: 0% % Reduction in Volume: 0% Epithelialization: None Tunneling: No Undermining: No Wound Description Classification: Full Thickness Without Exposed Support Structure Exudate Amount: None Present s Foul Odor After Cleansing: No Slough/Fibrino Yes Wound Bed Granulation Amount: None Present (0%) Exposed Structure Necrotic Amount: Large (67-100%) Fascia Exposed: No Necrotic Quality: Eschar Fat Layer (Subcutaneous Tissue) Exposed: Yes Tendon Exposed: No Muscle Exposed: No Joint Exposed: No Bone Exposed: No Treatment Notes Wound #3 (Foot) Wound Laterality: Left, Medial Cleanser Peri-Wound Care Topical Primary Dressing Prisma 4.34 (in) JAYVIAN, ESCOE A. ([id number]) Discharge Instruction: Moisten w/normal saline or sterile water; Cover wound as directed. Do not remove from wound bed. Secondary Dressing ABD Pad 5x9 (in/in) Discharge Instruction:  Cover with ABD pad Secured With 11M Medipore H Soft Cloth Surgical Tape, 2x2 (in/yd) Coban Cohesive Bandage 4x5 (yds) Stretched Discharge Instruction: Apply Coban as directed. Compression Wrap Compression Stockings Add-Ons Electronic Signature(s) Signed: 11/24/2020 5:25:07 PM By: Elliot Gurney, BSN, RN, CWS, Kim RN, BSN Signed: 11/25/2020 5:09:19 PM By: Hansel Feinstein Previous Signature: 11/24/2020 4:51:02 PM Version By: Hansel Feinstein Entered By: Elliot Gurney BSN, RN, CWS, Kim on 11/24/2020 17:24:39 [name] ([id number]) -------------------------------------------------------------------------------- Vitals Details Patient Name: [name] Date of Service: 11/24/2020 1:00 PM Medical Record Number: [id number] Patient Account Number: [id number] Date of Birth/Sex: [birth year] (29 y.o. M) Treating RN: Hansel Feinstein Primary Care Adin Lariccia: Lilyan Punt Other Clinician: Lolita Cram Referring Wynter Isaacs: Lilyan Punt Treating Cherry Wittwer/Extender: Altamese [place] in Treatment: 0 Vital Signs Time Taken: 12:50 Temperature (F): 98 Height (in): 72 Pulse (bpm): 61  Weight (lbs): 185 Respiratory Rate (breaths/min): 16 Body Mass Index (BMI): 25.1 Blood Pressure (mmHg): 128/80 Reference Range: 80 - 120 mg / dl Electronic Signature(s) Signed: 11/24/2020 4:51:02 PM By: Hansel Feinstein Entered ByHansel Feinstein on 11/24/2020 12:53:18

## 2020-11-25 NOTE — Telephone Encounter (Signed)
Holly from the plastic [facility] called back and stated patient was able to get in with [facility] and was seen yesterday.

## 2020-11-25 NOTE — Telephone Encounter (Signed)
Thank you for the FYI

## 2020-11-26 ENCOUNTER — Telehealth: Payer: Self-pay | Admitting: Family Medicine

## 2020-11-26 DIAGNOSIS — S91302A Unspecified open wound, left foot, initial encounter: Secondary | ICD-10-CM | POA: Diagnosis not present

## 2020-11-26 NOTE — Telephone Encounter (Signed)
Recently we consulted with hematology regarding this patient.  This was their reply regarding the Eliquis.  We will communicate this to his orthopedic surgeon.  We recommend that he take Eliquis without interruption for at least 12 weeks since diagnosis, which I believe you have already communicated to his surgeons. We have prescribed Lovenox injections for him to help with bridging at the time of surgery. This should be discussed with the patient's surgeon as well, but we recommend that Eliquis is stopped 7 days before surgery, and that he takes Lovenox shots instead. Recommend holding Lovenox the morning of surgery, and ask that the surgeon restarted as soon as possible following surgery, at his discretion. We would like [name] to resume Eliquis after he is discharged from surgery. We will see him in July 2022 to consider repeat imaging and possibly discontinuing his Eliquis. This will depend also on his functional status, as we believe that he will remain at high risk for recurrent clots while he is sedentary in light of his trauma and multiple surgeries

## 2020-11-27 DIAGNOSIS — Z760 Encounter for issue of repeat prescription: Secondary | ICD-10-CM | POA: Diagnosis not present

## 2020-11-30 ENCOUNTER — Other Ambulatory Visit (HOSPITAL_COMMUNITY): Payer: Self-pay

## 2020-11-30 DIAGNOSIS — S91302A Unspecified open wound, left foot, initial encounter: Secondary | ICD-10-CM | POA: Diagnosis not present

## 2020-11-30 MED FILL — Apixaban Tab 5 MG: ORAL | 30 days supply | Qty: 60 | Fill #0 | Status: AC

## 2020-12-01 ENCOUNTER — Other Ambulatory Visit: Payer: Self-pay

## 2020-12-01 ENCOUNTER — Encounter: Payer: 59 | Admitting: Internal Medicine

## 2020-12-01 DIAGNOSIS — L97522 Non-pressure chronic ulcer of other part of left foot with fat layer exposed: Secondary | ICD-10-CM | POA: Diagnosis not present

## 2020-12-01 DIAGNOSIS — T8781 Dehiscence of amputation stump: Secondary | ICD-10-CM | POA: Diagnosis not present

## 2020-12-01 DIAGNOSIS — T8131XA Disruption of external operation (surgical) wound, not elsewhere classified, initial encounter: Secondary | ICD-10-CM | POA: Diagnosis not present
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
## 2020-12-02 NOTE — Progress Notes (Signed)
[name], [name] ([id number]) Visit Report for 12/01/2020 Arrival Information Details Patient Name: [name], [name]. Date of Service: 12/01/2020 3:45 PM Medical Record Number: [id number] Patient Account Number: [id number] Date of Birth/Sex: 09/07/1990 (29 y.o. M) Treating RN: [name] Primary Care [name]: [name] Other Clinician: Lolita Lee Referring [name]: [name] Treating [name]/Extender: [name] in Treatment: 1 Visit Information History Since Last Visit Added or deleted any medications: No Patient Arrived: Crutches Had a fall or experienced change in No Arrival Time: 15:48 activities of daily living that may affect Accompanied By: mom risk of falls: Transfer Assistance: None Hospitalized since last visit: No Patient Identification Verified: Yes Has Dressing in Place as Prescribed: Yes Secondary Verification Process Completed: Yes Pain Present Now: Yes Patient Has Alerts: Yes Patient Alerts: Patient on Blood Thinner Eliquis NOT DIABETIC Electronic Signature(s) Signed: 12/02/2020 7:52:35 AM By: [name] Entered By: [name] on 12/01/2020 16:00:28 [name] ([id number]) -------------------------------------------------------------------------------- Clinic Level of Care Assessment Details Patient Name: [name] A. Date of Service: 12/01/2020 3:45 PM Medical Record Number: [id number] Patient Account Number: [id number] Date of Birth/Sex: Nov 01, 1990 (29 y.o. M) Treating RN: [name] Primary Care Tiernan Suto: [name] Other Clinician: Lolita Lee Referring Lakin Rhine: [name] Treating [name]/Extender: Brian  in Treatment: 1 Clinic Level of Care Assessment Items TOOL 4 Quantity Score X - Use when only an EandM is performed on FOLLOW-UP visit 1 0 ASSESSMENTS - Nursing Assessment / Reassessment X - Reassessment of Co-morbidities (includes updates in patient status) 1 10 X- 1  5 Reassessment of Adherence to Treatment Plan ASSESSMENTS - Wound and Skin Assessment / Reassessment []  - Simple Wound Assessment / Reassessment - one wound 0 X- 3 5 Complex Wound Assessment / Reassessment - multiple wounds []  - 0 Dermatologic / Skin Assessment (not related to wound area) ASSESSMENTS - Focused Assessment []  - Circumferential Edema Measurements - multi extremities 0 []  - 0 Nutritional Assessment / Counseling / Intervention []  - 0 Lower Extremity Assessment (monofilament, tuning fork, pulses) []  - 0 Peripheral Arterial Disease Assessment (using hand held doppler) ASSESSMENTS - Ostomy and/or Continence Assessment and Care []  - Incontinence Assessment and Management 0 []  - 0 Ostomy Care Assessment and Management (repouching, etc.) PROCESS - Coordination of Care X - Simple Patient / Family Education for ongoing care 1 15 []  - 0 Complex (extensive) Patient / Family Education for ongoing care []  - 0 Staff obtains , Records, Test Results / Process Orders []  - 0 Staff telephones HHA, Nursing Homes / Clarify orders / etc []  - 0 Routine Transfer to another Facility (non-emergent condition) []  - 0 Routine Hospital Admission (non-emergent condition) []  - 0 New Admissions / / Ordering NPWT, Apligraf, etc. []  - 0 Emergency Hospital Admission (emergent condition) X- 1 10 Simple Discharge Coordination []  - 0 Complex (extensive) Discharge Coordination PROCESS - Special Needs []  - Pediatric / Minor Patient Management 0 []  - 0 Isolation Patient Management []  - 0 Hearing / Language / Visual special needs []  - 0 Assessment of Community assistance (transportation, D/C planning, etc.) []  - 0 Additional assistance / Altered mentation []  - 0 Support Surface(s) Assessment (bed, cushion, seat, etc.) INTERVENTIONS - Wound Cleansing / Measurement [name], [name] ( ) []  - 0 Simple Wound Cleansing - one wound X- 3 5 Complex Wound  Cleansing - multiple wounds X- 1 5 Wound Imaging (photographs - any number of wounds) []  - 0 Wound Tracing (instead of photographs) []  - 0 Simple Wound  Measurement - one wound X- 3 5 Complex Wound Measurement - multiple wounds INTERVENTIONS - Wound Dressings []  - Small Wound Dressing one or multiple wounds 0 X- 1 15 Medium Wound Dressing one or multiple wounds []  - 0 Large Wound Dressing one or multiple wounds []  - 0 Application of Medications - topical []  - 0 Application of Medications - injection INTERVENTIONS - Miscellaneous []  - External ear exam 0 []  - 0 Specimen Collection (cultures, biopsies, blood, body fluids, etc.) []  - 0 Specimen(s) / Culture(s) sent or taken to Lab for analysis []  - 0 Patient Transfer (multiple staff / Lift / Similar devices) []  - 0 Simple Staple / Suture removal (25 or less) []  - 0 Complex Staple / Suture removal (26 or more) []  - 0 Hypo / Hyperglycemic Management (close monitor of Blood Glucose) []  - 0 Ankle / Brachial Index (ABI) - do not check if billed separately X- 1 5 Vital Signs Has the patient been seen at the hospital within the last three years: Yes Total Score: 110 Level Of Care: New/Established - Level 3 Electronic Signature(s) Signed: 12/01/2020 4:47:42 PM By: , RN Entered By: , Kenia on 12/01/2020 16:23:08 ( ) -------------------------------------------------------------------------------- Lower Extremity Assessment Details Patient Name: Brian Sites A. Date of Service: 12/01/2020 3:45 PM Medical Record Number: Patient Account Number: Date of Birth/Sex: [birth year] (29 y.o. M) Treating RN: Phillis Haggis Primary Care Jarmaine Ehrler: Dondra Prader Other Clinician: Phillis Haggis Referring Serayah Yazdani: 12/03/2020 Treating Carel Carrier/Extender: [name] Weeks in Treatment: 1 Edema Assessment Assessed: [Left: Yes] [Right: No] [Left:  Edema] [Right: :] Electronic Signature(s) Signed: 12/02/2020 7:52:35 AM By: [name] Entered By: 12/03/2020 on 12/01/2020 16:09:50 [id number] (06/25/1991) -------------------------------------------------------------------------------- Multi Wound Chart Details Patient Name: 03-07-1977 A. Date of Service: 12/01/2020 3:45 PM Medical Record Number: [name] Patient Account Number: [name] Date of Birth/Sex: 1990-12-14 (29 y.o. M) Treating RN: 12/04/2020 Primary Care Monesha Monreal: [name] Other Clinician: Hansel Lee Referring Jacinda Kanady: 12/03/2020 Treating Jaydan Meidinger/Extender: [name] in Treatment: 1 Vital Signs Height(in): 72 Pulse(bpm): 65 Weight(lbs): 185 Blood Pressure(mmHg): 120/77 Body Mass Index(BMI): 25 Temperature(F): 98.5 Respiratory Rate(breaths/min): 16 Photos: Wound Location: Left Foot Left, Lateral, Dorsal Foot Left, Medial Foot Wounding Event: Surgical Injury Surgical Injury Surgical Injury Primary Etiology: Trauma, Other Trauma, Other Trauma, Other Comorbid History: Neuropathy Neuropathy Neuropathy Date Acquired: 06/04/2020 05/31/2020 05/27/2020 Weeks of Treatment: 1 1 1  Wound Status: Open Open Healed - Epithelialized Measurements L x W x D (cm) 1.6x1.2x0.3 2.1x1.1x0.2 0x0x0 Area (cm) : 1.508 1.814 0 Volume (cm) : 0.452 0.363 0 % Reduction in Area: 29.40% 57.20% 100.00% % Reduction in Volume: 47.10% 57.20% 100.00% Classification: Full Thickness Without Exposed Full Thickness Without Exposed Full Thickness Without Exposed Support Structures Support Structures Support Structures Exudate Amount: Medium Medium None Present Exudate Type: Serosanguineous Serosanguineous N/A Exudate Color: red, brown red, brown N/A Granulation Amount: Medium (34-66%) Large (67-100%) None Present (0%) Granulation Quality: Red Red, Pink, Hyper-granulation N/A Necrotic Amount: Medium (34-66%) Small (1-33%) None Present (0%) Exposed  Structures: Fat Layer (Subcutaneous Tissue): Fat Layer (Subcutaneous Tissue): Fascia: No Yes Yes Fat Layer (Subcutaneous Tissue): Fascia: No Fascia: No No Tendon: No Tendon: No Tendon: No Muscle: No Muscle: No Muscle: No Joint: No Joint: No Joint: No Bone: No Bone: No Bone: No Epithelialization: N/A N/A Large (67-100%) Treatment Notes Electronic Signature(s) Signed: 12/01/2020 5:03:12 PM By: [id number] MD Entered By: 06[birth year]1993 on 12/01/2020 16:31:59 Wurzer, Khyre A. (  [id number]) -------------------------------------------------------------------------------- Multi-Disciplinary Care Plan Details Patient Name: [name], KUE. Date of Service: 12/01/2020 3:45 PM Medical Record Number: [id number] Patient Account Number: [id number] Date of Birth/Sex: 10-01-1990 (29 y.o. M) Treating RN: [name] Primary Care Samyra Limb: [name] Other Clinician: Lolita Lee Referring Omaria Plunk: [name] Treating Gaylen Venning/Extender: Brian [place] in Treatment: 1 Active Inactive Peripheral Neuropathy Nursing Diagnoses: Knowledge deficit related to disease process and management of peripheral neurovascular dysfunction Goals: Patient/caregiver will verbalize understanding of disease process and disease management Date Initiated: 11/24/2020 Target Resolution Date: 12/01/2020 Goal Status: Active Interventions: Assess signs and symptoms of neuropathy upon admission and as needed Notes: Wound/Skin Impairment Nursing Diagnoses: Impaired tissue integrity Goals: Patient/caregiver will verbalize understanding of skin care regimen Date Initiated: 11/24/2020 Target Resolution Date: 11/24/2020 Goal Status: Active Interventions: Assess patient/caregiver ability to obtain necessary supplies Assess ulceration(s) every visit Treatment Activities: Skin care regimen initiated : 11/24/2020 Topical wound management initiated : 11/24/2020 Notes: Electronic  Signature(s) Signed: 12/01/2020 4:47:42 PM By: Phillis Haggis, Dondra Prader RN Entered By: Phillis Haggis, Dondra Prader on 12/01/2020 16:21:19 [name] ([id number]) -------------------------------------------------------------------------------- Pain Assessment Details Patient Name: [name] A. Date of Service: 12/01/2020 3:45 PM Medical Record Number: [id number] Patient Account Number: [id number] Date of Birth/Sex: 09/13/1990 (29 y.o. M) Treating RN: [name] Primary Care Delshon Blanchfield: [name] Other Clinician: Lolita Lee Referring [place]: [name] Treating Latoshia Monrroy/Extender: Brian [place] in Treatment: 1 Active Problems Location of Pain Severity and Description of Pain Patient Has Paino Yes Site Locations Pain Location: Pain in Ulcers Rate the pain. Current Pain Level: 2 Pain Management and Medication Current Pain Management: Electronic Signature(s) Signed: 12/02/2020 7:52:35 AM By: [name] Entered By: [name] on 12/01/2020 16:00:42 [name] ([id number]) -------------------------------------------------------------------------------- Patient/Caregiver Education Details Patient Name: [name] A. Date of Service: 12/01/2020 3:45 PM Medical Record Number: [id number] Patient Account Number: [id number] Date of Birth/Gender: 02-May-1991 (29 y.o. M) Treating RN: [name] Primary Care Physician: [name] Other Clinician: Lolita Lee Referring Physician: Lilyan Lee Treating Physician/Extender: Brian [place] in Treatment: 1 Education Assessment Education Provided To: Patient Education Topics Provided Wound/Skin Impairment: Methods: Explain/Verbal Responses: State content correctly Electronic Signature(s) Signed: 12/01/2020 4:47:42 PM By: Phillis Haggis, Dondra Prader RN Entered By: Phillis Haggis, Dondra Prader on 12/01/2020 16:23:27 [name]  ([id number]) -------------------------------------------------------------------------------- Wound Assessment Details Patient Name: [name] A. Date of Service: 12/01/2020 3:45 PM Medical Record Number: [id number] Patient Account Number: [id number] Date of Birth/Sex: 01-12-1991 (29 y.o. M) Treating RN: [name] Primary Care Mehak Roskelley: [name] Other Clinician: Lolita Lee Referring Presly Steinruck: [name] Treating Huxton Glaus/Extender: Brian [place] in Treatment: 1 Wound Status Wound Number: 1 Primary Etiology: Trauma, Other Wound Location: Left Foot Wound Status: Open Wounding Event: Surgical Injury Comorbid History: Neuropathy Date Acquired: 06/04/2020 Weeks Of Treatment: 1 Clustered Wound: No Photos Wound Measurements Length: (cm) 1.6 Width: (cm) 1.2 Depth: (cm) 0.3 Area: (cm) 1.508 Volume: (cm) 0.452 % Reduction in Area: 29.4% % Reduction in Volume: 47.1% Tunneling: No Undermining: No Wound Description Classification: Full Thickness Without Exposed Support Structu Exudate Amount: Medium Exudate Type: Serosanguineous Exudate Color: red, brown res Foul Odor After Cleansing: No Slough/Fibrino Yes Wound Bed Granulation Amount: Medium (34-66%) Exposed Structure Granulation Quality: Red Fascia Exposed: No Necrotic Amount: Medium (34-66%) Fat Layer (Subcutaneous Tissue) Exposed: Yes Necrotic Quality: Adherent Slough Tendon Exposed: No Muscle Exposed: No Joint Exposed: No Bone Exposed: No Electronic Signature(s) Signed: 12/02/2020 7:52:35 AM By: [name] Entered ByHansel Lee on 12/01/2020 16:06:06 [name],  Brian AMarland Kitchen. ([id number]) -------------------------------------------------------------------------------- Wound Assessment Details Patient Name: [name], [name] Date of Service: 12/01/2020 3:45 PM Medical Record Number: [id number] Patient Account Number: [id number] Date of Birth/Sex: 05/06/1991 (29 y.o. M) Treating RN: Brian FeinsteinBishop,  Joy Primary Care Anas Reister: Brian PuntLuking, Scott Other Clinician: Lolita CramBurnette, Kyara Referring Boss Danielsen: Brian PuntLuking, Scott Treating Chablis Losh/Extender: Brian CarolinaOBSON, MICHAEL G Weeks in Treatment: 1 Wound Status Wound Number: 2 Primary Etiology: Trauma, Other Wound Location: Left, Lateral, Dorsal Foot Wound Status: Open Wounding Event: Surgical Injury Comorbid History: Neuropathy Date Acquired: 05/31/2020 Weeks Of Treatment: 1 Clustered Wound: No Photos Wound Measurements Length: (cm) 2.1 Width: (cm) 1.1 Depth: (cm) 0.2 Area: (cm) 1.814 Volume: (cm) 0.363 % Reduction in Area: 57.2% % Reduction in Volume: 57.2% Wound Description Classification: Full Thickness Without Exposed Support Structu Exudate Amount: Medium Exudate Type: Serosanguineous Exudate Color: red, brown res Foul Odor After Cleansing: No Slough/Fibrino Yes Wound Bed Granulation Amount: Large (67-100%) Exposed Structure Granulation Quality: Red, Pink, Hyper-granulation Fascia Exposed: No Necrotic Amount: Small (1-33%) Fat Layer (Subcutaneous Tissue) Exposed: Yes Necrotic Quality: Adherent Slough Tendon Exposed: No Muscle Exposed: No Joint Exposed: No Bone Exposed: No Electronic Signature(s) Signed: 12/02/2020 7:52:35 AM By: Brian FeinsteinBishop, Joy Entered By: Brian FeinsteinBishop, Joy on 12/01/2020 16:04:47 Brian CzechWALKER, [name] ([id number]) -------------------------------------------------------------------------------- Wound Assessment Details Patient Name: [name], [name] Date of Service: 12/01/2020 3:45 PM Medical Record Number: [id number] Patient Account Number: [id number] Date of Birth/Sex: 08/10/1991 (29 y.o. M) Treating RN: Brian BlockerSanchez, Kenia Primary Care Narda Fundora: Brian PuntLuking, Scott Other Clinician: Lolita CramBurnette, Kyara Referring Genita Nilsson: Brian PuntLuking, Scott Treating Tayshawn Purnell/Extender: Brian CarolinaOBSON, MICHAEL G Weeks in Treatment: 1 Wound Status Wound Number: 3 Primary Etiology: Trauma, Other Wound Location: Left, Medial Foot Wound Status: Healed -  Epithelialized Wounding Event: Surgical Injury Comorbid History: Neuropathy Date Acquired: 05/27/2020 Weeks Of Treatment: 1 Clustered Wound: No Photos Wound Measurements Length: (cm) 0 Width: (cm) 0 Depth: (cm) 0 Area: (cm) Volume: (cm) % Reduction in Area: 100% % Reduction in Volume: 100% Epithelialization: Large (67-100%) 0 Tunneling: No 0 Undermining: No Wound Description Classification: Full Thickness Without Exposed Support Structure Exudate Amount: None Present s Foul Odor After Cleansing: No Slough/Fibrino No Wound Bed Granulation Amount: None Present (0%) Exposed Structure Necrotic Amount: None Present (0%) Fascia Exposed: No Fat Layer (Subcutaneous Tissue) Exposed: No Tendon Exposed: No Muscle Exposed: No Joint Exposed: No Bone Exposed: No Electronic Signature(s) Signed: 12/01/2020 4:47:42 PM By: Phillis HaggisSanchez Pereyda, Dondra PraderKenia RN Entered By: Phillis HaggisSanchez Pereyda, Dondra PraderKenia on 12/01/2020 16:21:09 Brian CzechWALKER, [name] ([id number]) -------------------------------------------------------------------------------- Vitals Details Patient Name: [name], [name] Date of Service: 12/01/2020 3:45 PM Medical Record Number: [id number] Patient Account Number: [id number] Date of Birth/Sex: 03/15/1991 (29 y.o. M) Treating RN: Brian FeinsteinBishop, Joy Primary Care Nakai Pollio: Brian PuntLuking, Scott Other Clinician: Lolita CramBurnette, Kyara Referring Kymorah Korf: Brian PuntLuking, Scott Treating Quincey Nored/Extender: Brian CarolinaOBSON, MICHAEL G Weeks in Treatment: 1 Vital Signs Time Taken: 15:54 Temperature (F): 98.5 Height (in): 72 Pulse (bpm): 65 Weight (lbs): 185 Respiratory Rate (breaths/min): 16 Body Mass Index (BMI): 25.1 Blood Pressure (mmHg): 120/77 Reference Range: 80 - 120 mg / dl Electronic Signature(s) Signed: 12/02/2020 7:52:35 AM By: Brian FeinsteinBishop, Joy Entered ByHansel Lee: Bishop, Joy on 12/01/2020 16:00:34

## 2020-12-09 ENCOUNTER — Other Ambulatory Visit: Payer: Self-pay

## 2020-12-09 ENCOUNTER — Encounter: Payer: Self-pay | Admitting: Family Medicine

## 2020-12-09 ENCOUNTER — Telehealth: Payer: Self-pay | Admitting: Family Medicine

## 2020-12-09 ENCOUNTER — Ambulatory Visit (INDEPENDENT_AMBULATORY_CARE_PROVIDER_SITE_OTHER): Payer: 59 | Admitting: Family Medicine

## 2020-12-09 VITALS — BP 121/81 | HR 95 | Temp 98.0°F | Ht 71.0 in | Wt 187.0 lb

## 2020-12-09 DIAGNOSIS — I2699 Other pulmonary embolism without acute cor pulmonale: Secondary | ICD-10-CM | POA: Diagnosis not present

## 2020-12-09 DIAGNOSIS — Z5189 Encounter for other specified aftercare: Secondary | ICD-10-CM

## 2020-12-09 NOTE — Telephone Encounter (Signed)
Front I dictated a letter regarding [name]'s blood thinner.  This letter needs to be faxed to Dr. [name] orthopedist at [facility].  I also gave a copy of this to [facility] to carry with him to the orthopedist.  Thank you

## 2020-12-09 NOTE — Patient Instructions (Addendum)
Hi [name]  When you do your surgery in May please follow the following instructions  These instructions are based upon the recommendations of hematology  1 week before surgery stop Eliquis and start utilizing the Lovenox shots twice daily.  The morning of surgery do not take the Lovenox shot.  After surgery the surgeon can really start Lovenox shots at their discretion twice daily.  When you go home from surgery you may resume the Eliquis twice daily and stop the Lovenox shot unless orthopedics tells you differently.  Should he have questions or concerns please call  Thanks-Dr. Lorin Picket   I would like to see you back in 2 months to see how you are doing.

## 2020-12-09 NOTE — Progress Notes (Signed)
   Subjective:    Patient ID: [name], male    DOB: 1991-04-23, 30 y.o.   MRN: [id number]  HPI Follow up PE - questions about possible need for Xray of chest and L leg Leg leg foot wound - seeing wound clinic in [place] , has appt for surgery with orthopedic in mid May for L shin graft and heel care  Patient is making a lot of improvement does have some left knee pain but not severe we did review the x-ray which was done by his orthopedic doctor patient states the wound care is helping him a lot.  His breathing is doing better with an occasional sharp pain he is consistent with his medicine and not having any setbacks Is facing foot surgery in mid May Hematology recommends that he bridge   Review of Systems Please see above    Objective:   Physical Exam  Lungs clear respiratory rate normal heart regular Left knee subjective discomfort in the lateral portion of the left knee ligaments are stable calf is normal.  Foot is bandaged      Assessment & Plan:  Patient states that he will be talking to his orthopedic doctor about doing some x-rays of his foot he would like to see how it the bones look before surgery Left knee should gradually get better over time hopefully when he is going through physical therapy and weightbearing we will be doing better  Pulmonary embolus is stable as recommended by hematology will not do surgery until May.  Then at that point in time bridging Lovenox 1 week before the surgery as well as then going back to Eliquis after surgery  Patient will follow up here in a couple months follow-up sooner if any problems

## 2020-12-10 NOTE — Telephone Encounter (Signed)
I had an office visit with the patient We laid all of this out printed it and gave it to him plus also faxed a copy to his orthopedist and gave the patient a copy of instructions to get to the orthopedist patient will follow-up in 2 months

## 2020-12-11 DIAGNOSIS — Z760 Encounter for issue of repeat prescription: Secondary | ICD-10-CM | POA: Diagnosis not present

## 2020-12-13 DIAGNOSIS — S52022B Displaced fracture of olecranon process without intraarticular extension of left ulna, initial encounter for open fracture type I or II: Secondary | ICD-10-CM | POA: Diagnosis not present

## 2020-12-14 DIAGNOSIS — M24572 Contracture, left ankle: Secondary | ICD-10-CM | POA: Diagnosis not present

## 2020-12-14 DIAGNOSIS — S82252N Displaced comminuted fracture of shaft of left tibia, subsequent encounter for open fracture type IIIA, IIIB, or IIIC with nonunion: Secondary | ICD-10-CM | POA: Diagnosis not present

## 2020-12-15 ENCOUNTER — Telehealth: Payer: Self-pay

## 2020-12-15 ENCOUNTER — Encounter: Payer: 59 | Admitting: Internal Medicine

## 2020-12-15 ENCOUNTER — Other Ambulatory Visit: Payer: Self-pay

## 2020-12-15 DIAGNOSIS — L97522 Non-pressure chronic ulcer of other part of left foot with fat layer exposed: Secondary | ICD-10-CM | POA: Diagnosis not present

## 2020-12-15 DIAGNOSIS — T8781 Dehiscence of amputation stump: Secondary | ICD-10-CM | POA: Diagnosis not present

## 2020-12-15 NOTE — Telephone Encounter (Addendum)
Mother(DPR) notified and would like to pick up the hard copy of the information in the morning.
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
## 2020-12-15 NOTE — Telephone Encounter (Signed)
Schedule for surgery on May 11 the paper they got from Dr Lorin Picket says injection starting on May 1 the ortho said start on May 5th. Mother is wanting to stop taking the apixaban (ELIQUIS) 5 MG TABS tablet   Pt call [phone] (Mother)

## 2020-12-15 NOTE — Progress Notes (Signed)
DAJOUR, PIERPOINT ([id number]) Visit Report for 12/15/2020 Arrival Information Details Patient Name: [name], [name]. Date of Service: 12/15/2020 2:45 PM Medical Record Number: [id number] Patient Account Number: [id number] Date of Birth/Sex: May 17, 1991 (29 y.o. M) Treating RN: Hansel Feinstein Primary Care Taylinn Brabant: Lilyan Punt Other Clinician: Lolita Cram Referring Chaynce Schafer: Lilyan Punt Treating Athanasia Stanwood/Extender: Altamese [place] in Treatment: 3 Visit Information History Since Last Visit Added or deleted any medications: No Patient Arrived: Crutches Had a fall or experienced change in No Arrival Time: 14:42 activities of daily living that may affect Accompanied By: mom risk of falls: Transfer Assistance: None Hospitalized since last visit: No Patient Identification Verified: Yes Has Dressing in Place as Prescribed: Yes Secondary Verification Process Completed: Yes Pain Present Now: Yes Patient Has Alerts: Yes Patient Alerts: Patient on Blood Thinner Eliquis NOT DIABETIC Electronic Signature(s) Signed: 12/15/2020 4:46:55 PM By: Hansel Feinstein Entered By: Hansel Feinstein on 12/15/2020 14:42:54 [name] ([id number]) -------------------------------------------------------------------------------- Encounter Discharge Information Details Patient Name: Brian Hams A. Date of Service: 12/15/2020 2:45 PM Medical Record Number: [id number] Patient Account Number: [id number] Date of Birth/Sex: 1991-02-11 (29 y.o. M) Treating RN: Huel Coventry Primary Care Almer Bushey: Lilyan Punt Other Clinician: Lolita Cram Referring Blaklee Shores: Lilyan Punt Treating Jakarius Flamenco/Extender: Altamese [place] in Treatment: 3 Encounter Discharge Information Items Post Procedure Vitals Discharge Condition: Stable Temperature (F): 97.7 Ambulatory Status: Crutches Pulse (bpm): 83 Discharge Destination: Home Respiratory Rate (breaths/min): 16 Transportation: Private Auto Blood Pressure  (mmHg): 124/82 Accompanied By: mom Schedule Follow-up Appointment: Yes Clinical Summary of Care: Electronic Signature(s) Signed: 12/15/2020 4:08:37 PM By: Elliot Gurney, BSN, RN, CWS, Kim RN, BSN Entered By: Elliot Gurney, BSN, RN, CWS, Kim on 12/15/2020 15:13:03 [name] ([id number]) -------------------------------------------------------------------------------- Lower Extremity Assessment Details Patient Name: Brian Hams A. Date of Service: 12/15/2020 2:45 PM Medical Record Number: [id number] Patient Account Number: [id number] Date of Birth/Sex: [birth year] (29 y.o. M) Treating RN: Hansel Feinstein Primary Care Lynkin Saini: Lilyan Punt Other Clinician: Lolita Cram Referring Jocelin Schuelke: Lilyan Punt Treating Latiya Navia/Extender: Altamese [place] in Treatment: 3 Edema Assessment Assessed: [Left: Yes] [Right: No] Edema: [Left: N] [Right: o] Vascular Assessment Pulses: Dorsalis Pedis Palpable: [Left:No] Notes Hx lost vein in accident left leg Skin warm and dry and color at baseline and blanchable Electronic Signature(s) Signed: 12/15/2020 4:46:55 PM By: Hansel Feinstein Entered By: Hansel Feinstein on 12/15/2020 14:52:51 [name] ([id number]) -------------------------------------------------------------------------------- Multi Wound Chart Details Patient Name: Brian Hams A. Date of Service: 12/15/2020 2:45 PM Medical Record Number: [id number] Patient Account Number: [id number] Date of Birth/Sex: September 04, 1990 (29 y.o. M) Treating RN: Huel Coventry Primary Care Komal Stangelo: Lilyan Punt Other Clinician: Lolita Cram Referring Kenzey Birkland: Lilyan Punt Treating Kesia Dalto/Extender: Altamese [place] in Treatment: 3 Vital Signs Height(in): 72 Pulse(bpm): 83 Weight(lbs): 185 Blood Pressure(mmHg): 124/82 Body Mass Index(BMI): 25 Temperature(F): 97.7 Respiratory Rate(breaths/min): 16 Photos: [N/A:N/A] Wound Location: Left Foot Left, Lateral, Dorsal Foot N/A Wounding Event:  Surgical Injury Surgical Injury N/A Primary Etiology: Trauma, Other Trauma, Other N/A Comorbid History: Neuropathy Neuropathy N/A Date Acquired: 06/04/2020 05/31/2020 N/A Weeks of Treatment: 3 3 N/A Wound Status: Open Healed - Epithelialized N/A Measurements L x W x D (cm) 1x0.5x0.5 0x0x0 N/A Area (cm) : 0.393 0 N/A Volume (cm) : 0.196 0 N/A % Reduction in Area: 81.60% 100.00% N/A % Reduction in Volume: 77.10% 100.00% N/A Starting Position 1 (o'clock):7 Ending Position 1 (o'clock): 11 Maximum Distance 1 (cm): 0.7 Undermining: Yes No N/A Classification: Full Thickness Without Exposed Full Thickness Without Exposed N/A Support Structures Support  Structures Exudate Amount: Medium Medium N/A Exudate Type: Serosanguineous Serosanguineous N/A Exudate Color: red, brown red, brown N/A Granulation Amount: Medium (34-66%) None Present (0%) N/A Granulation Quality: Red N/A N/A Necrotic Amount: Medium (34-66%) Large (67-100%) N/A Necrotic Tissue: Adherent Slough Eschar N/A Exposed Structures: Fat Layer (Subcutaneous Tissue): Fat Layer (Subcutaneous Tissue): N/A Yes Yes Fascia: No Fascia: No Tendon: No Tendon: No Muscle: No Muscle: No Joint: No Joint: No Bone: No Bone: No Debridement: N/A Debridement - Excisional N/A Pre-procedure Verification/Time N/A 05:06 N/A Out Taken: Tissue Debrided: N/A Subcutaneous, Slough N/A Level: N/A Skin/Subcutaneous Tissue N/A Debridement Area (sq cm): N/A 0.04 N/A Instrument: N/A Curette N/A Bleeding: N/A Minimum N/A Hemostasis Achieved: N/A Pressure N/A N/A Procedure was tolerated well N/A [name], [name] ([id number]) Debridement Treatment Response: Post Debridement N/A 0.2x0.2x0.2 N/A Measurements L x W x D (cm) Post Debridement Volume: N/A 0.006 N/A (cm) Procedures Performed: N/A Debridement N/A Treatment Notes Wound #1 (Foot) Wound Laterality: Left Cleanser Peri-Wound Care Topical Primary Dressing Prisma 4.34 (in) Discharge  Instruction: Moisten w/normal saline or sterile water; Cover wound as directed. Do not remove from wound bed. Secondary Dressing ABD Pad 5x9 (in/in) Discharge Instruction: Cover with ABD pad Secured With 66M Medipore H Soft Cloth Surgical Tape, 2x2 (in/yd) Kerlix Roll Sterile or Non-Sterile 6-ply 4.5x4 (yd/yd) Discharge Instruction: Apply Kerlix as directed Compression Wrap Compression Stockings Add-Ons Wound #2 (Foot) Wound Laterality: Dorsal, Left, Lateral Cleanser Peri-Wound Care Topical Primary Dressing Secondary Dressing Secured With Compression Wrap Compression Stockings Add-Ons Electronic Signature(s) Signed: 12/15/2020 5:04:28 PM By: Baltazar Najjarobson, Michael MD Entered By: Baltazar Najjarobson, Michael on 12/15/2020 16:06:05 Verna CzechWALKER, [name] ([id number]) -------------------------------------------------------------------------------- Multi-Disciplinary Care Plan Details Patient Name: [name], [name] Date of Service: 12/15/2020 2:45 PM Medical Record Number: [id number] Patient Account Number: [id number] Date of Birth/Sex: 05/22/1991 (29 y.o. M) Treating RN: Huel CoventryWoody, Kim Primary Care Tahisha Hakim: Lilyan PuntLuking, Scott Other Clinician: Lolita CramBurnette, Kyara Referring Emiliya Chretien: Lilyan PuntLuking, Scott Treating Tranise Forrest/Extender: Altamese CarolinaOBSON, MICHAEL G Weeks in Treatment: 3 Active Inactive Peripheral Neuropathy Nursing Diagnoses: Knowledge deficit related to disease process and management of peripheral neurovascular dysfunction Goals: Patient/caregiver will verbalize understanding of disease process and disease management Date Initiated: 11/24/2020 Target Resolution Date: 12/01/2020 Goal Status: Active Interventions: Assess signs and symptoms of neuropathy upon admission and as needed Notes: Wound/Skin Impairment Nursing Diagnoses: Impaired tissue integrity Goals: Patient/caregiver will verbalize understanding of skin care regimen Date Initiated: 11/24/2020 Target Resolution Date: 11/24/2020 Goal Status:  Active Interventions: Assess patient/caregiver ability to obtain necessary supplies Assess ulceration(s) every visit Treatment Activities: Skin care regimen initiated : 11/24/2020 Topical wound management initiated : 11/24/2020 Notes: Electronic Signature(s) Signed: 12/15/2020 4:08:37 PM By: Elliot GurneyWoody, BSN, RN, CWS, Kim RN, BSN Entered By: Elliot GurneyWoody, BSN, RN, CWS, Kim on 12/15/2020 15:05:51 Verna CzechWALKER, [name] ([id number]) -------------------------------------------------------------------------------- Pain Assessment Details Patient Name: [name], [name] Date of Service: 12/15/2020 2:45 PM Medical Record Number: [id number] Patient Account Number: [id number] Date of Birth/Sex: 02/13/1991 (29 y.o. M) Treating RN: Hansel FeinsteinBishop, Joy Primary Care Marithza Malachi: Lilyan PuntLuking, Scott Other Clinician: Lolita CramBurnette, Kyara Referring Reese Senk: Lilyan PuntLuking, Scott Treating Nyela Cortinas/Extender: Altamese CarolinaOBSON, MICHAEL G Weeks in Treatment: 3 Active Problems Location of Pain Severity and Description of Pain Patient Has Paino No Site Locations Rate the pain. Current Pain Level: 2 Pain Management and Medication Current Pain Management: Electronic Signature(s) Signed: 12/15/2020 4:46:55 PM By: Hansel FeinsteinBishop, Joy Entered By: Hansel FeinsteinBishop, Joy on 12/15/2020 14:43:55 Verna CzechWALKER, [name] ([id number]) -------------------------------------------------------------------------------- Patient/Caregiver Education Details Patient Name: [name], [name] Date of Service: 12/15/2020 2:45 PM Medical Record Number: [id number] Patient Account Number: [id number]  Date of Birth/Gender: 1990/12/06 (29 y.o. M) Treating RN: Huel Coventry Primary Care Physician: Lilyan Punt Other Clinician: Lolita Cram Referring Physician: Lilyan Punt Treating Physician/Extender: Altamese [place] in Treatment: 3 Education Assessment Education Provided To: Patient Education Topics Provided Wound Debridement: Handouts: Wound Debridement Methods: Demonstration,  Explain/Verbal Responses: State content correctly Electronic Signature(s) Signed: 12/15/2020 4:08:37 PM By: Elliot Gurney, BSN, RN, CWS, Kim RN, BSN Entered By: Elliot Gurney, BSN, RN, CWS, Kim on 12/15/2020 15:12:13 [name] ([id number]) -------------------------------------------------------------------------------- Wound Assessment Details Patient Name: Brian Hams A. Date of Service: 12/15/2020 2:45 PM Medical Record Number: [id number] Patient Account Number: [id number] Date of Birth/Sex: 1991-03-07 (29 y.o. M) Treating RN: Hansel Feinstein Primary Care Pablo Stauffer: Lilyan Punt Other Clinician: Lolita Cram Referring Vansh Reckart: Lilyan Punt Treating Kathleen Likins/Extender: Altamese [place] in Treatment: 3 Wound Status Wound Number: 1 Primary Etiology: Trauma, Other Wound Location: Left Foot Wound Status: Open Wounding Event: Surgical Injury Comorbid History: Neuropathy Date Acquired: 06/04/2020 Weeks Of Treatment: 3 Clustered Wound: No Photos Wound Measurements Length: (cm) 1 Width: (cm) 0.5 Depth: (cm) 0.5 Area: (cm) 0.393 Volume: (cm) 0.196 % Reduction in Area: 81.6% % Reduction in Volume: 77.1% Tunneling: No Undermining: Yes Starting Position (o'clock): 7 Ending Position (o'clock): 11 Maximum Distance: (cm) 0.7 Wound Description Classification: Full Thickness Without Exposed Support Structu Exudate Amount: Medium Exudate Type: Serosanguineous Exudate Color: red, brown res Foul Odor After Cleansing: No Slough/Fibrino Yes Wound Bed Granulation Amount: Medium (34-66%) Exposed Structure Granulation Quality: Red Fascia Exposed: No Necrotic Amount: Medium (34-66%) Fat Layer (Subcutaneous Tissue) Exposed: Yes Necrotic Quality: Adherent Slough Tendon Exposed: No Muscle Exposed: No Joint Exposed: No Bone Exposed: No Treatment Notes Wound #1 (Foot) Wound Laterality: Left Cleanser Peri-Wound Care [name], [name] A. ([id number]) Topical Primary Dressing Prisma 4.34  (in) Discharge Instruction: Moisten w/normal saline or sterile water; Cover wound as directed. Do not remove from wound bed. Secondary Dressing ABD Pad 5x9 (in/in) Discharge Instruction: Cover with ABD pad Secured With 16M Medipore H Soft Cloth Surgical Tape, 2x2 (in/yd) Kerlix Roll Sterile or Non-Sterile 6-ply 4.5x4 (yd/yd) Discharge Instruction: Apply Kerlix as directed Compression Wrap Compression Stockings Add-Ons Electronic Signature(s) Signed: 12/15/2020 4:08:37 PM By: Elliot Gurney, BSN, RN, CWS, Kim RN, BSN Signed: 12/15/2020 4:46:55 PM By: Hansel Feinstein Entered By: Elliot Gurney, BSN, RN, CWS, Kim on 12/15/2020 15:09:10 [name] ([id number]) -------------------------------------------------------------------------------- Wound Assessment Details Patient Name: [name], [name] Date of Service: 12/15/2020 2:45 PM Medical Record Number: [id number] Patient Account Number: [id number] Date of Birth/Sex: 11-Jul-1991 (29 y.o. M) Treating RN: Huel Coventry Primary Care Joelyn Lover: Lilyan Punt Other Clinician: Lolita Cram Referring Tameah Mihalko: Lilyan Punt Treating Djibril Glogowski/Extender: Altamese [place] in Treatment: 3 Wound Status Wound Number: 2 Primary Etiology: Trauma, Other Wound Location: Left, Lateral, Dorsal Foot Wound Status: Healed - Epithelialized Wounding Event: Surgical Injury Comorbid History: Neuropathy Date Acquired: 05/31/2020 Weeks Of Treatment: 3 Clustered Wound: No Photos Wound Measurements Length: (cm) 0 Width: (cm) 0 Depth: (cm) 0 Area: (cm) Volume: (cm) % Reduction in Area: 100% % Reduction in Volume: 100% Tunneling: No 0 Undermining: No 0 Wound Description Classification: Full Thickness Without Exposed Support Structu Exudate Amount: Medium Exudate Type: Serosanguineous Exudate Color: red, brown res Foul Odor After Cleansing: No Slough/Fibrino Yes Wound Bed Granulation Amount: None Present (0%) Exposed Structure Necrotic Amount: Large  (67-100%) Fascia Exposed: No Necrotic Quality: Eschar Fat Layer (Subcutaneous Tissue) Exposed: Yes Tendon Exposed: No Muscle Exposed: No Joint Exposed: No Bone Exposed: No Treatment Notes Wound #2 (Foot) Wound Laterality: Dorsal, Left, Lateral  Cleanser Peri-Wound Care Topical Primary Dressing [name], [name] ([id number]) Secondary Dressing Secured With Compression Wrap Compression Stockings Add-Ons Electronic Signature(s) Signed: 12/15/2020 4:08:37 PM By: Elliot Gurney, BSN, RN, CWS, Kim RN, BSN Entered By: Elliot Gurney, BSN, RN, CWS, Kim on 12/15/2020 15:11:12 [name] ([id number]) -------------------------------------------------------------------------------- Vitals Details Patient Name: Brian Hams A. Date of Service: 12/15/2020 2:45 PM Medical Record Number: [id number] Patient Account Number: [id number] Date of Birth/Sex: 20-Oct-1990 (29 y.o. M) Treating RN: Hansel Feinstein Primary Care Rainah Kirshner: Lilyan Punt Other Clinician: Lolita Cram Referring Damaso Laday: Lilyan Punt Treating Daena Alper/Extender: Altamese [place] in Treatment: 3 Vital Signs Time Taken: 14:42 Temperature (F): 97.7 Height (in): 72 Pulse (bpm): 83 Weight (lbs): 185 Respiratory Rate (breaths/min): 16 Body Mass Index (BMI): 25.1 Blood Pressure (mmHg): 124/82 Reference Range: 80 - 120 mg / dl Electronic Signature(s) Signed: 12/15/2020 4:46:55 PM By: Hansel Feinstein Entered ByHansel Feinstein on 12/15/2020 14:43:24

## 2020-12-15 NOTE — Telephone Encounter (Signed)
To clarify when they stop the Eliquis on the evening of May 4 they will take the first dose that evening then starting on May 5 they will do 2 doses a day as described in the previous message

## 2020-12-15 NOTE — Telephone Encounter (Signed)
Nurses-please touch base with [name] that surgery is May 11? I believe that they are mis- reading the paper that I gave him. I will be happy to write out plainly on paper a schedule to follow Essentially they will stop Eliquis the evening of May 4 and start Lovenox on that day They will do Lovenox in the morning and 12 hours later in the evening on May 5, sixth, seventh, eighth, ninth, 10th On May 11 which is the day of surgery no Lovenox on that morning Then the surgeon and [facility] doctors will take care of this issue while he is in the hospital and indicate when he can restart his Eliquis More than likely he will be on Eliquis upon discharge and he will take that twice daily until his follow-up visit with hematology in July 2022 And certainly we are more than happy to see [name] in follow-up after surgery if family would like for [place] to do so  Please communicate with [name] if there are any questions that the above does not address it is very important to run this by me. Once again if I need to write this out on paper for her I can do so Please make sure that [name] has 100% understanding this is something that is very important to get right and not to do incorrectly Thanks-Dr. Lorin Picket

## 2020-12-16 ENCOUNTER — Encounter: Payer: Self-pay | Admitting: Family Medicine

## 2020-12-16 NOTE — Telephone Encounter (Signed)
Letter was dictated.  Please make sure mom gets a copy of this thank you

## 2020-12-22 ENCOUNTER — Other Ambulatory Visit: Payer: Self-pay

## 2020-12-22 ENCOUNTER — Encounter: Payer: 59 | Attending: Internal Medicine | Admitting: Internal Medicine

## 2020-12-22 DIAGNOSIS — L97522 Non-pressure chronic ulcer of other part of left foot with fat layer exposed: Secondary | ICD-10-CM | POA: Diagnosis not present

## 2020-12-22 DIAGNOSIS — L97521 Non-pressure chronic ulcer of other part of left foot limited to breakdown of skin: Secondary | ICD-10-CM | POA: Diagnosis not present

## 2020-12-22 DIAGNOSIS — Z760 Encounter for issue of repeat prescription: Secondary | ICD-10-CM | POA: Diagnosis not present

## 2020-12-22 DIAGNOSIS — L97528 Non-pressure chronic ulcer of other part of left foot with other specified severity: Secondary | ICD-10-CM | POA: Diagnosis not present
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
## 2020-12-23 NOTE — Progress Notes (Signed)
DAMIR, LEUNG ([id number]) Visit Report for 12/22/2020 Arrival Information Details Patient Name: [name], [name]. Date of Service: 12/22/2020 2:00 PM Medical Record Number: [id number] Patient Account Number: [id number] Date of Birth/Sex: 15-Feb-1991 (29 y.o. M) Treating RN: Hansel Feinstein Primary Care Jeovanny Cuadros: Lilyan Punt Other Clinician: Referring Dinnis Rog: Lilyan Punt Treating Shantrice Rodenberg/Extender: Altamese [place] in Treatment: 4 Visit Information History Since Last Visit Added or deleted any medications: No Patient Arrived: Crutches Had a fall or experienced change in No Arrival Time: 13:50 activities of daily living that may affect Accompanied By: mom risk of falls: Transfer Assistance: None Hospitalized since last visit: No Patient Identification Verified: Yes Has Dressing in Place as Prescribed: Yes Secondary Verification Process Completed: Yes Pain Present Now: Yes Patient Has Alerts: Yes Patient Alerts: Patient on Blood Thinner Eliquis NOT DIABETIC Electronic Signature(s) Signed: 12/22/2020 4:20:07 PM By: Hansel Feinstein Entered By: Hansel Feinstein on 12/22/2020 13:51:58 [name] ([id number]) -------------------------------------------------------------------------------- Encounter Discharge Information Details Patient Name: Brian Hams A. Date of Service: 12/22/2020 2:00 PM Medical Record Number: [id number] Patient Account Number: [id number] Date of Birth/Sex: 1990/11/11 (29 y.o. M) Treating RN: Huel Coventry Primary Care Kynesha Guerin: Lilyan Punt Other Clinician: Referring Tiara Maultsby: Lilyan Punt Treating Rebie Peale/Extender: Altamese [place] in Treatment: 4 Encounter Discharge Information Items Post Procedure Vitals Discharge Condition: Stable Unable to obtain vitals Reason: . Ambulatory Status: Ambulatory Discharge Destination: Home Transportation: Private Auto Accompanied By: mom Schedule Follow-up Appointment: Yes Clinical Summary of Care: Electronic  Signature(s) Signed: 12/22/2020 6:13:14 PM By: Elliot Gurney, BSN, RN, CWS, Kim RN, BSN Entered By: Elliot Gurney, BSN, RN, CWS, Kim on 12/22/2020 14:56:08 [name] ([id number]) -------------------------------------------------------------------------------- Lower Extremity Assessment Details Patient Name: Brian Hams A. Date of Service: 12/22/2020 2:00 PM Medical Record Number: [id number] Patient Account Number: [id number] Date of Birth/Sex: 01/13/1991 (29 y.o. M) Treating RN: Hansel Feinstein Primary Care Katrell Milhorn: Lilyan Punt Other Clinician: Referring Patrick Salemi: Lilyan Punt Treating Mabry Tift/Extender: Maxwell Caul Weeks in Treatment: 4 Edema Assessment Assessed: [Left: Yes] [Right: No] [Left: Edema] [Right: :] Electronic Signature(s) Signed: 12/22/2020 4:20:07 PM By: Hansel Feinstein Entered By: Hansel Feinstein on 12/22/2020 14:01:30 [name] ([id number]) -------------------------------------------------------------------------------- Multi Wound Chart Details Patient Name: Brian Hams A. Date of Service: 12/22/2020 2:00 PM Medical Record Number: [id number] Patient Account Number: [id number] Date of Birth/Sex: Jan 30, 1991 (29 y.o. M) Treating RN: Huel Coventry Primary Care Talecia Sherlin: Lilyan Punt Other Clinician: Referring Berdene Askari: Lilyan Punt Treating Draysen Weygandt/Extender: Altamese [place] in Treatment: 4 Vital Signs Height(in): 72 Pulse(bpm): 60 Weight(lbs): 185 Blood Pressure(mmHg): 123/80 Body Mass Index(BMI): 25 Temperature(F): 97.6 Respiratory Rate(breaths/min): 16 Photos: [N/A:N/A] Wound Location: Left Foot N/A N/A Wounding Event: Surgical Injury N/A N/A Primary Etiology: Trauma, Other N/A N/A Comorbid History: Neuropathy N/A N/A Date Acquired: 06/04/2020 N/A N/A Weeks of Treatment: 4 N/A N/A Wound Status: Open N/A N/A Measurements L x W x D (cm) 0.9x0.4x0.5 N/A N/A Area (cm) : 0.283 N/A N/A Volume (cm) : 0.141 N/A N/A % Reduction in Area: 86.80% N/A N/A %  Reduction in Volume: 83.50% N/A N/A Classification: Full Thickness Without Exposed N/A N/A Support Structures Exudate Amount: Medium N/A N/A Exudate Type: Serosanguineous N/A N/A Exudate Color: red, brown N/A N/A Granulation Amount: Medium (34-66%) N/A N/A Granulation Quality: Red N/A N/A Necrotic Amount: Medium (34-66%) N/A N/A Exposed Structures: Fat Layer (Subcutaneous Tissue): N/A N/A Yes Fascia: No Tendon: No Muscle: No Joint: No Bone: No Treatment Notes Electronic Signature(s) Signed: 12/22/2020 6:13:14 PM By: Elliot Gurney, BSN, RN, CWS, Kim RN, BSN Entered By: Elliot Gurney,  BSN, RN, CWS, Kim on 12/22/2020 14:50:51 [name], [name] ([id number]) -------------------------------------------------------------------------------- Multi-Disciplinary Care Plan Details Patient Name: [name], [name]. Date of Service: 12/22/2020 2:00 PM Medical Record Number: [id number] Patient Account Number: [id number] Date of Birth/Sex: 01-02-1991 (29 y.o. M) Treating RN: Huel Coventry Primary Care Ysabelle Goodroe: Lilyan Punt Other Clinician: Referring Ikhlas Albo: Lilyan Punt Treating Hien Cunliffe/Extender: Altamese Girard in Treatment: 4 Active Inactive Peripheral Neuropathy Nursing Diagnoses: Knowledge deficit related to disease process and management of peripheral neurovascular dysfunction Goals: Patient/caregiver will verbalize understanding of disease process and disease management Date Initiated: 11/24/2020 Target Resolution Date: 12/01/2020 Goal Status: Active Interventions: Assess signs and symptoms of neuropathy upon admission and as needed Notes: Wound/Skin Impairment Nursing Diagnoses: Impaired tissue integrity Goals: Patient/caregiver will verbalize understanding of skin care regimen Date Initiated: 11/24/2020 Target Resolution Date: 11/24/2020 Goal Status: Active Interventions: Assess patient/caregiver ability to obtain necessary supplies Assess ulceration(s) every visit Treatment  Activities: Skin care regimen initiated : 11/24/2020 Topical wound management initiated : 11/24/2020 Notes: Electronic Signature(s) Signed: 12/22/2020 6:13:14 PM By: Elliot Gurney, BSN, RN, CWS, Kim RN, BSN Entered By: Elliot Gurney, BSN, RN, CWS, Kim on 12/22/2020 14:50:45 [name] ([id number]) -------------------------------------------------------------------------------- Pain Assessment Details Patient Name: Brian Hams A. Date of Service: 12/22/2020 2:00 PM Medical Record Number: [id number] Patient Account Number: [id number] Date of Birth/Sex: 1991-06-09 (29 y.o. M) Treating RN: Hansel Feinstein Primary Care Andriea Hasegawa: Lilyan Punt Other Clinician: Referring Kalifa Cadden: Lilyan Punt Treating Avaleen Brownley/Extender: Altamese [place] in Treatment: 4 Active Problems Location of Pain Severity and Description of Pain Patient Has Paino Yes Site Locations Pain Location: Pain in Ulcers Rate the pain. Current Pain Level: 2 Pain Management and Medication Current Pain Management: Electronic Signature(s) Signed: 12/22/2020 4:20:07 PM By: Hansel Feinstein Entered By: Hansel Feinstein on 12/22/2020 13:55:01 [name] ([id number]) -------------------------------------------------------------------------------- Patient/Caregiver Education Details Patient Name: Brian Hams A. Date of Service: 12/22/2020 2:00 PM Medical Record Number: [id number] Patient Account Number: [id number] Date of Birth/Gender: 05-Apr-1991 (29 y.o. M) Treating RN: Huel Coventry Primary Care Physician: Lilyan Punt Other Clinician: Referring Physician: Lilyan Punt Treating Physician/Extender: Altamese [place] in Treatment: 4 Education Assessment Education Provided To: Patient Education Topics Provided Venous: Handouts: Controlling Swelling with Compression Stockings Methods: Demonstration, Explain/Verbal Responses: State content correctly Electronic Signature(s) Signed: 12/22/2020 6:13:14 PM By: Elliot Gurney, BSN, RN, CWS, Kim RN,  BSN Entered By: Elliot Gurney, BSN, RN, CWS, Kim on 12/22/2020 14:55:11 [name] ([id number]) -------------------------------------------------------------------------------- Wound Assessment Details Patient Name: Brian Hams A. Date of Service: 12/22/2020 2:00 PM Medical Record Number: [id number] Patient Account Number: [id number] Date of Birth/Sex: 1990-12-21 (29 y.o. M) Treating RN: Hansel Feinstein Primary Care Adelis Docter: Lilyan Punt Other Clinician: Referring Shmiel Morton: Lilyan Punt Treating Dessa Ledee/Extender: Altamese  in Treatment: 4 Wound Status Wound Number: 1 Primary Etiology: Trauma, Other Wound Location: Left Foot Wound Status: Open Wounding Event: Surgical Injury Comorbid History: Neuropathy Date Acquired: 06/04/2020 Weeks Of Treatment: 4 Clustered Wound: No Photos Wound Measurements Length: (cm) 0.9 Width: (cm) 0.4 Depth: (cm) 0.5 Area: (cm) 0.283 Volume: (cm) 0.141 % Reduction in Area: 86.8% % Reduction in Volume: 83.5% Wound Description Classification: Full Thickness Without Exposed Support Structu Exudate Amount: Medium Exudate Type: Serosanguineous Exudate Color: red, brown res Foul Odor After Cleansing: No Slough/Fibrino Yes Wound Bed Granulation Amount: Medium (34-66%) Exposed Structure Granulation Quality: Red Fascia Exposed: No Necrotic Amount: Medium (34-66%) Fat Layer (Subcutaneous Tissue) Exposed: Yes Necrotic Quality: Adherent Slough Tendon Exposed: No Muscle Exposed: No Joint Exposed: No Bone Exposed: No Treatment Notes Wound #  1 (Foot) Wound Laterality: Left Cleanser Peri-Wound Care Topical Primary Dressing AB, LEAMING A. ([id number]) Prisma 4.34 (in) Discharge Instruction: Moisten w/normal saline or sterile water; Cover wound as directed. Do not remove from wound bed. Secondary Dressing ABD Pad 5x9 (in/in) Discharge Instruction: Cover with ABD pad Secured With 11M ACE Elastic Bandage With VELCRO Brand Closure, 4  (in) Compression Wrap Compression Stockings Add-Ons Electronic Signature(s) Signed: 12/22/2020 4:20:07 PM By: Hansel Feinstein Entered By: Hansel Feinstein on 12/22/2020 14:00:31 [name] ([id number]) -------------------------------------------------------------------------------- Vitals Details Patient Name: Brian Hams A. Date of Service: 12/22/2020 2:00 PM Medical Record Number: [id number] Patient Account Number: [id number] Date of Birth/Sex: 1990-09-21 (29 y.o. M) Treating RN: Hansel Feinstein Primary Care Calyn Sivils: Lilyan Punt Other Clinician: Referring Megyn Leng: Lilyan Punt Treating Jacobo Moncrief/Extender: Altamese [place] in Treatment: 4 Vital Signs Time Taken: 13:52 Temperature (F): 97.6 Height (in): 72 Pulse (bpm): 60 Weight (lbs): 185 Respiratory Rate (breaths/min): 16 Body Mass Index (BMI): 25.1 Blood Pressure (mmHg): 123/80 Reference Range: 80 - 120 mg / dl Electronic Signature(s) Signed: 12/22/2020 4:20:07 PM By: Hansel Feinstein Entered ByHansel Feinstein on 12/22/2020 13:54:25

## 2020-12-26 ENCOUNTER — Telehealth: Payer: Self-pay | Admitting: Family Medicine

## 2020-12-26 NOTE — Telephone Encounter (Signed)
Nurses Please let me speak with patient's mother Marcelino Duster regarding the patient's upcoming surgery and utilization of Lovenox thank you (Oncology recommends holding the Lovenox 24 hours prior to surgery) I want to make sure that Marcelino Duster and Trinna Post are fully aware of this

## 2020-12-27 ENCOUNTER — Telehealth: Payer: Self-pay

## 2020-12-27 DIAGNOSIS — S82402N Unspecified fracture of shaft of left fibula, subsequent encounter for open fracture type IIIA, IIIB, or IIIC with nonunion: Secondary | ICD-10-CM | POA: Diagnosis not present

## 2020-12-27 DIAGNOSIS — M24572 Contracture, left ankle: Secondary | ICD-10-CM | POA: Diagnosis not present

## 2020-12-27 DIAGNOSIS — S82202N Unspecified fracture of shaft of left tibia, subsequent encounter for open fracture type IIIA, IIIB, or IIIC with nonunion: Secondary | ICD-10-CM | POA: Diagnosis not present

## 2020-12-27 DIAGNOSIS — Z01818 Encounter for other preprocedural examination: Secondary | ICD-10-CM | POA: Diagnosis not present

## 2020-12-27 NOTE — Telephone Encounter (Signed)
Mom called said Dr Lorin Picket told quit taking his enoxaparin (LOVENOX) 80 MG/0.8ML injection tonight but anesthesiologist [facility] told him to take it tonight and in the morning and to stop taking it and mom wants to know if this is correct.  Call back [phone]

## 2020-12-27 NOTE — Telephone Encounter (Signed)
Called michelle and transferred to doctor per request

## 2020-12-27 NOTE — Telephone Encounter (Signed)
I did discuss with Marcelino Duster The anesthesiologist at [facility] who recommended for the patient to be on Lovenox on Tuesday morning with the surgery on Wednesday morning They typically get their shots at 10 PM and at 10 AM Tonight they will do the Lovenox shot at 9 PM And on Tuesday morning they will do Lovenox shot in the vicinity of 7 AM-7:30 AM They will hold/do not do Lovenox shot Tuesday evening or Wednesday morning They are to report to [facility] at 6:30 AM on Wednesday morning with surgery soon thereafter The surgeon will determine when to restart Eliquis Marcelino Duster voiced understanding She will keep [place] updated about [name]'s progress

## 2020-12-29 DIAGNOSIS — S82252N Displaced comminuted fracture of shaft of left tibia, subsequent encounter for open fracture type IIIA, IIIB, or IIIC with nonunion: Secondary | ICD-10-CM | POA: Diagnosis not present

## 2020-12-29 DIAGNOSIS — M24572 Contracture, left ankle: Secondary | ICD-10-CM | POA: Diagnosis not present

## 2020-12-29 DIAGNOSIS — G8918 Other acute postprocedural pain: Secondary | ICD-10-CM | POA: Diagnosis not present

## 2020-12-29 DIAGNOSIS — Z7901 Long term (current) use of anticoagulants: Secondary | ICD-10-CM | POA: Diagnosis not present

## 2020-12-30 DIAGNOSIS — G8918 Other acute postprocedural pain: Secondary | ICD-10-CM | POA: Diagnosis not present

## 2020-12-30 DIAGNOSIS — M24572 Contracture, left ankle: Secondary | ICD-10-CM | POA: Diagnosis not present

## 2020-12-30 DIAGNOSIS — Z7901 Long term (current) use of anticoagulants: Secondary | ICD-10-CM | POA: Diagnosis not present

## 2020-12-30 DIAGNOSIS — S82252N Displaced comminuted fracture of shaft of left tibia, subsequent encounter for open fracture type IIIA, IIIB, or IIIC with nonunion: Secondary | ICD-10-CM | POA: Diagnosis not present

## 2020-12-30 DIAGNOSIS — M79662 Pain in left lower leg: Secondary | ICD-10-CM | POA: Diagnosis not present

## 2020-12-31 ENCOUNTER — Telehealth: Payer: Self-pay | Admitting: Family Medicine

## 2020-12-31 DIAGNOSIS — S82252N Displaced comminuted fracture of shaft of left tibia, subsequent encounter for open fracture type IIIA, IIIB, or IIIC with nonunion: Secondary | ICD-10-CM | POA: Diagnosis not present

## 2020-12-31 DIAGNOSIS — Z7901 Long term (current) use of anticoagulants: Secondary | ICD-10-CM | POA: Diagnosis not present

## 2020-12-31 DIAGNOSIS — M79662 Pain in left lower leg: Secondary | ICD-10-CM | POA: Diagnosis not present

## 2020-12-31 DIAGNOSIS — M24572 Contracture, left ankle: Secondary | ICD-10-CM | POA: Diagnosis not present

## 2020-12-31 DIAGNOSIS — G8918 Other acute postprocedural pain: Secondary | ICD-10-CM | POA: Diagnosis not present

## 2020-12-31 NOTE — Telephone Encounter (Signed)
FYI-Patient is still at [facility] waiting to be released but Mom wanted you to know they havent given patient instruction on how to use his blood thinner and mom is concerned that no one their has come in his  room to let them know and she is refusing to leave until they do. Not sure what she wants you to do.

## 2020-12-31 NOTE — Telephone Encounter (Signed)
Returned call to patient he stated they have spoken with orthopedics team who did the surgery and received instructions to restart Eliquis tonight at 10:00 pm, he says would like a call from his doctor to get his opinion on the matter .

## 2020-12-31 NOTE — Telephone Encounter (Signed)
He may restart his Eliquis as directed by orthopedist tonight at 10 PM.  No longer necessary to take Lovenox.  He will notify us of any problems.  He is to see hematology in July as planned.  Hopefully at that point if things test out well they can stop the Eliquis altogether.  If he has any bleeding problems he is to call the surgeon and to let us know.  Patient voiced understanding

## 2021-01-01 DIAGNOSIS — Z760 Encounter for issue of repeat prescription: Secondary | ICD-10-CM | POA: Diagnosis not present

## 2021-01-03 ENCOUNTER — Telehealth: Payer: Self-pay | Admitting: Family Medicine

## 2021-01-03 ENCOUNTER — Other Ambulatory Visit (HOSPITAL_COMMUNITY): Payer: Self-pay

## 2021-01-03 ENCOUNTER — Other Ambulatory Visit: Payer: Self-pay

## 2021-01-03 MED ORDER — GABAPENTIN 300 MG PO CAPS
300.0000 mg | ORAL_CAPSULE | Freq: Three times a day (TID) | ORAL | 0 refills | Status: DC | PRN
  Filled 2021-01-03: qty 42, 14d supply, fill #0

## 2021-01-03 MED FILL — Apixaban Tab 5 MG: ORAL | 30 days supply | Qty: 60 | Fill #1 | Status: AC

## 2021-01-03 NOTE — Telephone Encounter (Addendum)
Mother (DPR) notified and advised to contact surgeon's office concerning this matter.

## 2021-01-03 NOTE — Telephone Encounter (Signed)
Mom is requesting that you take out pain blocker needle that surgeon put in. She states is due out tomorrow because he will be out of medication by them.Please advise

## 2021-01-03 NOTE — Telephone Encounter (Signed)
Nurses (This is not something that I am familiar with.  I would assume the surgical office would be the one to take this out.  What where they instructed when they left the hospital?  In my 30 years I have never had to do anything like what is being requested)

## 2021-01-05 ENCOUNTER — Ambulatory Visit: Payer: 59 | Admitting: Internal Medicine

## 2021-01-10 ENCOUNTER — Other Ambulatory Visit (HOSPITAL_COMMUNITY): Payer: Self-pay

## 2021-01-10 MED ORDER — CEPHALEXIN 500 MG PO CAPS
500.0000 mg | ORAL_CAPSULE | Freq: Four times a day (QID) | ORAL | 0 refills | Status: DC
  Filled 2021-01-10: qty 40, 10d supply, fill #0

## 2021-01-11 ENCOUNTER — Telehealth: Payer: Self-pay

## 2021-01-11 DIAGNOSIS — S72352A Displaced comminuted fracture of shaft of left femur, initial encounter for closed fracture: Secondary | ICD-10-CM | POA: Diagnosis not present

## 2021-01-11 NOTE — Telephone Encounter (Signed)
Takes antibiotics, eliquis, vit d, tylenol, oxycodone in the mornings and feels dizzy after taking meds. Since having surgery about 2 weeks ago he has noticed the dizziness. Pt states off and on he feels this way. Not every day. Was told when he started eliquis to let dr know if he had dizziness. Dizziness happens only when lying down with eyes closed. States he has had some blurry vision sometimes. States he dips tobacco and has noticed the blurry vision usually happens after he dips.

## 2021-01-11 NOTE — Telephone Encounter (Signed)
Pt recently has been feeling light headed in the morning after he takes his meds. When he lays down he gets dizzy.   Pt call back [phone]

## 2021-01-11 NOTE — Telephone Encounter (Signed)
Hopefully over the course of the next 7 to 10 days his pain will get to the point where he can tolerate it without oxycodone and perhaps just use Tylenol  If the dizziness gets worse he should do a follow-up visit otherwise he has an appointment in a few weeks

## 2021-01-11 NOTE — Telephone Encounter (Signed)
It is not the Eliquis that is causing this-Eliquis is a blood thinner but should not trigger dizziness  I would highly encourage him to work hard at getting to the point where Tylenol alone is used for his discomfort and get away from oxycodone.  Oxycodone can cause drowsiness, dizziness, and make a person feel bad plus it is addictive How many oxycodones are he taking per day? Certainly I understand taking some oxycodone for pain but has his leg gets better he needs to get away from this As best as possible he needs to wean off of this for his long-term health

## 2021-01-11 NOTE — Telephone Encounter (Signed)
Discussed with pt. Pt states he takes one oxycodone every day and sometimes has to take one bid.

## 2021-01-12 DIAGNOSIS — R600 Localized edema: Secondary | ICD-10-CM | POA: Diagnosis not present

## 2021-01-12 DIAGNOSIS — M79672 Pain in left foot: Secondary | ICD-10-CM | POA: Diagnosis not present

## 2021-01-12 DIAGNOSIS — G8918 Other acute postprocedural pain: Secondary | ICD-10-CM | POA: Diagnosis not present

## 2021-01-12 DIAGNOSIS — Z7901 Long term (current) use of anticoagulants: Secondary | ICD-10-CM | POA: Diagnosis not present

## 2021-01-12 DIAGNOSIS — Z89432 Acquired absence of left foot: Secondary | ICD-10-CM | POA: Diagnosis not present

## 2021-01-12 NOTE — Telephone Encounter (Signed)
Patient advised per Dr Lorin Picket: Hopefully over the course of the next 7 to 10 days his pain will get to the point where he can tolerate it without oxycodone and perhaps just use Tylenol   If the dizziness gets worse he should do a follow-up visit otherwise he has an appointment in a few weeks  Patient verbalized understanding and stated he is stopping the oxycodone and will let Dr Lorin Picket know how it goes

## 2021-01-13 DIAGNOSIS — M79672 Pain in left foot: Secondary | ICD-10-CM | POA: Diagnosis not present

## 2021-01-13 DIAGNOSIS — Z7901 Long term (current) use of anticoagulants: Secondary | ICD-10-CM | POA: Diagnosis not present

## 2021-01-13 DIAGNOSIS — Z89432 Acquired absence of left foot: Secondary | ICD-10-CM | POA: Diagnosis not present

## 2021-01-13 DIAGNOSIS — R600 Localized edema: Secondary | ICD-10-CM | POA: Diagnosis not present

## 2021-01-13 DIAGNOSIS — G8918 Other acute postprocedural pain: Secondary | ICD-10-CM | POA: Diagnosis not present

## 2021-01-17 ENCOUNTER — Other Ambulatory Visit (HOSPITAL_COMMUNITY): Payer: Self-pay

## 2021-01-19 ENCOUNTER — Other Ambulatory Visit: Payer: Self-pay

## 2021-01-19 ENCOUNTER — Other Ambulatory Visit (HOSPITAL_COMMUNITY): Payer: Self-pay

## 2021-01-19 ENCOUNTER — Encounter: Payer: 59 | Attending: Internal Medicine | Admitting: Internal Medicine

## 2021-01-19 DIAGNOSIS — L97522 Non-pressure chronic ulcer of other part of left foot with fat layer exposed: Secondary | ICD-10-CM | POA: Diagnosis not present

## 2021-01-19 DIAGNOSIS — T8133XA Disruption of traumatic injury wound repair, initial encounter: Secondary | ICD-10-CM | POA: Insufficient documentation

## 2021-01-19 DIAGNOSIS — Y838 Other surgical procedures as the cause of abnormal reaction of the patient, or of later complication, without mention of misadventure at the time of the procedure: Secondary | ICD-10-CM | POA: Insufficient documentation

## 2021-01-20 ENCOUNTER — Other Ambulatory Visit (HOSPITAL_COMMUNITY): Payer: Self-pay

## 2021-01-20 MED ORDER — GABAPENTIN 300 MG PO CAPS
300.0000 mg | ORAL_CAPSULE | Freq: Three times a day (TID) | ORAL | 2 refills | Status: DC
  Filled 2021-01-20: qty 90, 30d supply, fill #0
  Filled 2021-03-03: qty 90, 30d supply, fill #1
  Filled 2021-04-10: qty 90, 30d supply, fill #2
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
## 2021-01-20 NOTE — Progress Notes (Signed)
Brian, HACKER ([id number]) Visit Report for 01/19/2021 Arrival Information Details Patient Name: [name], [name]. Date of Service: 01/19/2021 3:00 PM Medical Record Number: [id number] Patient Account Number: [id number] Date of Birth/Sex: Nov 10, 1990 (30 y.o. M) Treating RN: Hansel Feinstein Primary Care Narda Fundora: Lilyan Punt Other Clinician: Referring Shine Scrogham: Lilyan Punt Treating Nicasio Barlowe/Extender: Altamese Kiester in Treatment: 8 Visit Information History Since Last Visit Added or deleted any medications: No Patient Arrived: Crutches Had a fall or experienced change in No Arrival Time: 15:11 activities of daily living that may affect Accompanied By: mother risk of falls: Transfer Assistance: None Hospitalized since last visit: Yes Patient Identification Verified: Yes Has Dressing in Place as Prescribed: Yes Secondary Verification Process Completed: Yes Pain Present Now: Yes Patient Has Alerts: Yes Patient Alerts: Patient on Blood Thinner Eliquis NOT DIABETIC Electronic Signature(s) Signed: 01/20/2021 9:13:32 AM By: Hansel Feinstein Entered By: Hansel Feinstein on 01/19/2021 15:16:02 [name] ([id number]) -------------------------------------------------------------------------------- Clinic Level of Care Assessment Details Patient Name: [name]. Date of Service: 01/19/2021 3:00 PM Medical Record Number: [id number] Patient Account Number: [id number] Date of Birth/Sex: 1991/01/30 (30 y.o. M) Treating RN: Rogers Blocker Primary Care Keymani Glynn: Lilyan Punt Other Clinician: Referring Ellen Mayol: Lilyan Punt Treating Kooper Godshall/Extender: Altamese  in Treatment: 8 Clinic Level of Care Assessment Items TOOL 1 Quantity Score []  - Use when EandM and Procedure is performed on INITIAL visit 0 ASSESSMENTS - Nursing Assessment / Reassessment []  - General Physical Exam (combine w/ comprehensive assessment (listed just below) when performed on new 0 pt. evals) []   - 0 Comprehensive Assessment (HX, ROS, Risk Assessments, Wounds Hx, etc.) ASSESSMENTS - Wound and Skin Assessment / Reassessment []  - Dermatologic / Skin Assessment (not related to wound area) 0 ASSESSMENTS - Ostomy and/or Continence Assessment and Care []  - Incontinence Assessment and Management 0 []  - 0 Ostomy Care Assessment and Management (repouching, etc.) PROCESS - Coordination of Care []  - Simple Patient / Family Education for ongoing care 0 []  - 0 Complex (extensive) Patient / Family Education for ongoing care []  - 0 Staff obtains , Records, Test Results / Process Orders []  - 0 Staff telephones HHA, Nursing Homes / Clarify orders / etc []  - 0 Routine Transfer to another Facility (non-emergent condition) []  - 0 Routine Hospital Admission (non-emergent condition) []  - 0 New Admissions / / Ordering NPWT, Apligraf, etc. []  - 0 Emergency Hospital Admission (emergent condition) PROCESS - Special Needs []  - Pediatric / Minor Patient Management 0 []  - 0 Isolation Patient Management []  - 0 Hearing / Language / Visual special needs []  - 0 Assessment of Community assistance (transportation, D/C planning, etc.) []  - 0 Additional assistance / Altered mentation []  - 0 Support Surface(s) Assessment (bed, cushion, seat, etc.) INTERVENTIONS - Miscellaneous []  - External ear exam 0 []  - 0 Patient Transfer (multiple staff / / Similar devices) []  - 0 Simple Staple / Suture removal (25 or less) []  - 0 Complex Staple / Suture removal (26 or more) []  - 0 Hypo/Hyperglycemic Management (do not check if billed separately) []  - 0 Ankle / Brachial Index (ABI) - do not check if billed separately Has the patient been seen at the hospital within the last three years: Yes Total Score: 0 Level Of Care: ____ ( ) Electronic Signature(s) Signed: 01/19/2021 4:52:16 PM By: , RN Entered By: , Chiropractor on 01/19/2021 16:04:01 ( ) -------------------------------------------------------------------------------- Encounter Discharge Information Details Patient Name: ,  [name] Date of Service: 01/19/2021 3:00 PM Medical Record Number: [id number] Patient Account Number: [id number] Date of Birth/Sex: November 24, 1990 (30 y.o. M) Treating RN: Huel Coventry Primary Care Missie Gehrig: Lilyan Punt Other Clinician: Referring Vannah Nadal: Lilyan Punt Treating Tarnisha Kachmar/Extender: Altamese Stephens in Treatment: 8 Encounter Discharge Information Items Post Procedure Vitals Discharge Condition: Stable Temperature (F): 98.4 Ambulatory Status: Crutches Pulse (bpm): 76 Discharge Destination: Home Respiratory Rate (breaths/min): 16 Transportation: Private Auto Blood Pressure (mmHg): 119/78 Accompanied By: mother Schedule Follow-up Appointment: Yes Clinical Summary of Care: Electronic Signature(s) Signed: 01/19/2021 4:53:17 PM By: Lolita Cram Entered By: Lolita Cram on 01/19/2021 16:30:51 [name] ([id number]) -------------------------------------------------------------------------------- Lower Extremity Assessment Details Patient Name: Brian Hams A. Date of Service: 01/19/2021 3:00 PM Medical Record Number: [id number] Patient Account Number: [id number] Date of Birth/Sex: August 14, 1991 (30 y.o. M) Treating RN: Hansel Feinstein Primary Care Effie Janoski: Lilyan Punt Other Clinician: Referring Fernando Stoiber: Lilyan Punt Treating Jeweliana Dudgeon/Extender: Maxwell Caul Weeks in Treatment: 8 Edema Assessment Assessed: [Left: Yes] [Right: No] Edema: [Left: N] [Right: o] Electronic Signature(s) Signed: 01/20/2021 9:13:32 AM By: Hansel Feinstein Entered By: Hansel Feinstein on 01/19/2021 15:28:02 [name] ([id number]) -------------------------------------------------------------------------------- Multi Wound Chart Details Patient Name: Brian Hams A. Date of  Service: 01/19/2021 3:00 PM Medical Record Number: [id number] Patient Account Number: [id number] Date of Birth/Sex: 15-Feb-1991 (30 y.o. M) Treating RN: Rogers Blocker Primary Care Davinia Riccardi: Lilyan Punt Other Clinician: Referring Martine Trageser: Lilyan Punt Treating Lynnsie Linders/Extender: Altamese [place] in Treatment: 8 Vital Signs Height(in): 72 Pulse(bpm): 76 Weight(lbs): 185 Blood Pressure(mmHg): 119/78 Body Mass Index(BMI): 25 Temperature(F): 98.4 Respiratory Rate(breaths/min): 16 Photos: [N/A:N/A] Wound Location: Left Foot Left Foot N/A Wounding Event: Surgical Injury Surgical Injury N/A Primary Etiology: Trauma, Other Trauma, Other N/A Comorbid History: Neuropathy Neuropathy N/A Date Acquired: 06/04/2020 01/14/2021 N/A Weeks of Treatment: 8 0 N/A Wound Status: Open Open N/A Measurements L x W x D (cm) 1x0.8x0.1 1x0.5x0.1 N/A Area (cm) : 0.628 0.393 N/A Volume (cm) : 0.063 0.039 N/A % Reduction in Area: 70.60% N/A N/A % Reduction in Volume: 92.60% N/A N/A Classification: Full Thickness Without Exposed Full Thickness Without Exposed N/A Support Structures Support Structures Exudate Amount: Medium Medium N/A Exudate Type: Serosanguineous Serosanguineous N/A Exudate Color: red, brown red, brown N/A Granulation Amount: None Present (0%) Large (67-100%) N/A Granulation Quality: N/A Red, Pink N/A Necrotic Amount: Large (67-100%) None Present (0%) N/A Necrotic Tissue: Eschar N/A N/A Exposed Structures: Fat Layer (Subcutaneous Tissue): Fat Layer (Subcutaneous Tissue): N/A Yes Yes Fascia: No Fascia: No Tendon: No Tendon: No Muscle: No Muscle: No Joint: No Joint: No Bone: No Bone: No Debridement: Debridement - Excisional N/A N/A Pre-procedure Verification/Time 15:59 N/A N/A Out Taken: Tissue Debrided: Necrotic/Eschar, Subcutaneous, N/A N/A Slough Level: Skin/Subcutaneous Tissue N/A N/A Debridement Area (sq cm): 0.8 N/A N/A Instrument: Curette N/A N/A Bleeding:  Moderate N/A N/A Hemostasis Achieved: Pressure N/A N/A Debridement Treatment Procedure was tolerated well N/A N/A Response: Post Debridement 1x0.8x0.2 N/A N/A Measurements L x W x D (cm) 0.126 N/A N/A ELL, TISO A. ([id number]) Post Debridement Volume: (cm) Procedures Performed: Debridement N/A N/A Treatment Notes Wound #1 (Foot) Wound Laterality: Left, Medial Cleanser Normal Saline Discharge Instruction: Wash your hands with soap and water. Remove old dressing, discard into plastic bag and place into trash. Cleanse the wound with Normal Saline prior to applying a clean dressing using gauze sponges, not tissues or cotton balls. Do not scrub or use excessive force. Pat dry using gauze sponges, not tissue or cotton balls. Peri-Wound Care Topical  Primary Dressing Prisma 4.34 (in) Discharge Instruction: Moisten w/ hydrogel. Cover wound as directed. Do not remove from wound bed. Secondary Dressing ABD Pad 5x9 (in/in) Discharge Instruction: Cover with ABD pad Secured With 61M ACE Elastic Bandage With VELCRO Brand Closure, 4 (in) Compression Wrap Compression Stockings Add-Ons Wound #4 (Foot) Wound Laterality: Left, Lateral Cleanser Normal Saline Discharge Instruction: Wash your hands with soap and water. Remove old dressing, discard into plastic bag and place into trash. Cleanse the wound with Normal Saline prior to applying a clean dressing using gauze sponges, not tissues or cotton balls. Do not scrub or use excessive force. Pat dry using gauze sponges, not tissue or cotton balls. Peri-Wound Care Topical Primary Dressing Silvercel Small 2x2 (in/in) Discharge Instruction: Apply Silvercel Small 2x2 (in/in) as instructed Secondary Dressing ABD Pad 5x9 (in/in) Discharge Instruction: Cover with ABD pad Secured With 61M ACE Elastic Bandage With VELCRO Brand Closure, 4 (in) Compression Wrap Compression Stockings Add-Ons Electronic Signature(s) Signed: 01/19/2021 5:08:05 PM By:  Baltazar Najjar MD Entered By: Baltazar Najjar on 01/19/2021 16:49:27 [name] ([id number]) Dan Humphreys, Theresia Majors ([id number]) -------------------------------------------------------------------------------- Multi-Disciplinary Care Plan Details Patient Name: KHARON, HIXON A. Date of Service: 01/19/2021 3:00 PM Medical Record Number: [id number] Patient Account Number: [id number] Date of Birth/Sex: 02-12-1991 (29 y.o. M) Treating RN: Rogers Blocker Primary Care Lan Mcneill: Lilyan Punt Other Clinician: Referring Garnell Phenix: Lilyan Punt Treating Rosalio Catterton/Extender: Altamese [place] in Treatment: 8 Active Inactive Peripheral Neuropathy Nursing Diagnoses: Knowledge deficit related to disease process and management of peripheral neurovascular dysfunction Goals: Patient/caregiver will verbalize understanding of disease process and disease management Date Initiated: 11/24/2020 Target Resolution Date: 12/01/2020 Goal Status: Active Interventions: Assess signs and symptoms of neuropathy upon admission and as needed Notes: Wound/Skin Impairment Nursing Diagnoses: Impaired tissue integrity Goals: Patient/caregiver will verbalize understanding of skin care regimen Date Initiated: 11/24/2020 Target Resolution Date: 11/24/2020 Goal Status: Active Interventions: Assess patient/caregiver ability to obtain necessary supplies Assess ulceration(s) every visit Treatment Activities: Skin care regimen initiated : 11/24/2020 Topical wound management initiated : 11/24/2020 Notes: Electronic Signature(s) Signed: 01/19/2021 4:52:16 PM By: Phillis Haggis, Dondra Prader RN Entered By: Phillis Haggis, Dondra Prader on 01/19/2021 15:58:48 [name] ([id number]) -------------------------------------------------------------------------------- Pain Assessment Details Patient Name: Brian Hams A. Date of Service: 01/19/2021 3:00 PM Medical Record Number: [id number] Patient Account Number: [id number] Date of  Birth/Sex: 02-Oct-1990 (29 y.o. M) Treating RN: Hansel Feinstein Primary Care Aashrith Eves: Lilyan Punt Other Clinician: Referring Tycen Dockter: Lilyan Punt Treating Jonnette Nuon/Extender: Altamese Montz in Treatment: 8 Active Problems Location of Pain Severity and Description of Pain Patient Has Paino Yes Site Locations Rate the pain. Current Pain Level: 4 Pain Management and Medication Current Pain Management: Electronic Signature(s) Signed: 01/20/2021 9:13:32 AM By: Hansel Feinstein Entered By: Hansel Feinstein on 01/19/2021 15:19:36 [name] ([id number]) -------------------------------------------------------------------------------- Patient/Caregiver Education Details Patient Name: Brian Hams A. Date of Service: 01/19/2021 3:00 PM Medical Record Number: [id number] Patient Account Number: [id number] Date of Birth/Gender: 1990-10-24 (29 y.o. M) Treating RN: Rogers Blocker Primary Care Physician: Lilyan Punt Other Clinician: Referring Physician: Lilyan Punt Treating Physician/Extender: Altamese [place] in Treatment: 8 Education Assessment Education Provided To: Patient Education Topics Provided Wound/Skin Impairment: Methods: Explain/Verbal Responses: State content correctly Electronic Signature(s) Signed: 01/19/2021 4:52:16 PM By: Phillis Haggis, Dondra Prader RN Entered By: Phillis Haggis, Dondra Prader on 01/19/2021 16:04:39 [name] ([id number]) -------------------------------------------------------------------------------- Wound Assessment Details Patient Name: Brian Hams A. Date of Service: 01/19/2021 3:00 PM Medical Record Number: [id number] Patient Account Number: [id number] Date of Birth/Sex: [birth year] (29 y.o.  M) Treating RN: Hansel FeinsteinBishop, Joy Primary Care Johnnell Liou: Lilyan PuntLuking, Scott Other Clinician: Referring Anthonette Lesage: Lilyan PuntLuking, Scott Treating Cobain Morici/Extender: Altamese CarolinaOBSON, MICHAEL G Weeks in Treatment: 8 Wound Status Wound Number: 1 Primary Etiology: Trauma, Other Wound  Location: Left Foot Wound Status: Open Wounding Event: Surgical Injury Comorbid History: Neuropathy Date Acquired: 06/04/2020 Weeks Of Treatment: 8 Clustered Wound: No Photos Wound Measurements Length: (cm) 1 Width: (cm) 0.8 Depth: (cm) 0.1 Area: (cm) 0.628 Volume: (cm) 0.063 % Reduction in Area: 70.6% % Reduction in Volume: 92.6% Tunneling: No Undermining: No Wound Description Classification: Full Thickness Without Exposed Support Structu Exudate Amount: Medium Exudate Type: Serosanguineous Exudate Color: red, brown res Foul Odor After Cleansing: No Slough/Fibrino Yes Wound Bed Granulation Amount: None Present (0%) Exposed Structure Necrotic Amount: Large (67-100%) Fascia Exposed: No Necrotic Quality: Eschar Fat Layer (Subcutaneous Tissue) Exposed: Yes Tendon Exposed: No Muscle Exposed: No Joint Exposed: No Bone Exposed: No Electronic Signature(s) Signed: 01/20/2021 9:13:32 AM By: Hansel FeinsteinBishop, Joy Entered By: Hansel FeinsteinBishop, Joy on 01/19/2021 15:22:29 Brian CzechWALKER, Javius A. ([id number]) -------------------------------------------------------------------------------- Wound Assessment Details Patient Name: Brian HamsWALKER, Marland A. Date of Service: 01/19/2021 3:00 PM Medical Record Number: [id number] Patient Account Number: [id number] Date of Birth/Sex: 03/22/1991 (29 y.o. M) Treating RN: Hansel FeinsteinBishop, Joy Primary Care Jaymie Mckiddy: Lilyan PuntLuking, Scott Other Clinician: Referring Kursten Kruk: Lilyan PuntLuking, Scott Treating Grete Bosko/Extender: Altamese CarolinaOBSON, MICHAEL G Weeks in Treatment: 8 Wound Status Wound Number: 4 Primary Etiology: Trauma, Other Wound Location: Left Foot Wound Status: Open Wounding Event: Surgical Injury Comorbid History: Neuropathy Date Acquired: 01/14/2021 Weeks Of Treatment: 0 Clustered Wound: No Photos Wound Measurements Length: (cm) 1 Width: (cm) 0.5 Depth: (cm) 0.1 Area: (cm) 0.393 Volume: (cm) 0.039 % Reduction in Area: % Reduction in Volume: Tunneling: No Undermining: No Wound  Description Classification: Full Thickness Without Exposed Support Structu Exudate Amount: Medium Exudate Type: Serosanguineous Exudate Color: red, brown res Foul Odor After Cleansing: No Wound Bed Granulation Amount: Large (67-100%) Exposed Structure Granulation Quality: Red, Pink Fascia Exposed: No Necrotic Amount: None Present (0%) Fat Layer (Subcutaneous Tissue) Exposed: Yes Tendon Exposed: No Muscle Exposed: No Joint Exposed: No Bone Exposed: No Electronic Signature(s) Signed: 01/20/2021 9:13:32 AM By: Hansel FeinsteinBishop, Joy Entered By: Hansel FeinsteinBishop, Joy on 01/19/2021 15:26:54 Brian CzechWALKER, Kaimen A. ([id number]) -------------------------------------------------------------------------------- Vitals Details Patient Name: Brian HamsWALKER, Arius A. Date of Service: 01/19/2021 3:00 PM Medical Record Number: [id number] Patient Account Number: [id number] Date of Birth/Sex: 07/14/1991 (29 y.o. M) Treating RN: Hansel FeinsteinBishop, Joy Primary Care Tonika Eden: Lilyan PuntLuking, Scott Other Clinician: Referring Cassiopeia Florentino: Lilyan PuntLuking, Scott Treating Rhianon Zabawa/Extender: Altamese CarolinaOBSON, MICHAEL G Weeks in Treatment: 8 Vital Signs Time Taken: 15:16 Temperature (F): 98.4 Height (in): 72 Pulse (bpm): 76 Weight (lbs): 185 Respiratory Rate (breaths/min): 16 Body Mass Index (BMI): 25.1 Blood Pressure (mmHg): 119/78 Reference Range: 80 - 120 mg / dl Electronic Signature(s) Signed: 01/20/2021 9:13:32 AM By: Hansel FeinsteinBishop, Joy Entered ByHansel Feinstein: Bishop, Joy on 01/19/2021 15:16:28

## 2021-01-25 ENCOUNTER — Other Ambulatory Visit (HOSPITAL_COMMUNITY): Payer: Self-pay

## 2021-01-25 DIAGNOSIS — Z4789 Encounter for other orthopedic aftercare: Secondary | ICD-10-CM | POA: Diagnosis not present

## 2021-01-25 DIAGNOSIS — S82252K Displaced comminuted fracture of shaft of left tibia, subsequent encounter for closed fracture with nonunion: Secondary | ICD-10-CM | POA: Diagnosis not present

## 2021-01-25 DIAGNOSIS — S82252N Displaced comminuted fracture of shaft of left tibia, subsequent encounter for open fracture type IIIA, IIIB, or IIIC with nonunion: Secondary | ICD-10-CM | POA: Diagnosis not present

## 2021-01-25 DIAGNOSIS — S82402K Unspecified fracture of shaft of left fibula, subsequent encounter for closed fracture with nonunion: Secondary | ICD-10-CM | POA: Diagnosis not present

## 2021-01-25 MED ORDER — VITAMIN D3 50 MCG (2000 UT) PO CAPS: 2000.0000 [IU] | ORAL_CAPSULE | Freq: Every day | ORAL | 0 refills | Status: DC

## 2021-01-25 MED ORDER — GABAPENTIN 300 MG PO CAPS: 300.0000 mg | ORAL_CAPSULE | Freq: Three times a day (TID) | ORAL | 2 refills | Status: DC

## 2021-01-25 MED ORDER — CALCIUM CARBONATE ANTACID 750 MG PO CHEW: 750.0000 mg | CHEWABLE_TABLET | Freq: Every morning | ORAL | 0 refills | Status: DC

## 2021-01-26 ENCOUNTER — Encounter: Payer: 59 | Admitting: Internal Medicine

## 2021-01-26 ENCOUNTER — Other Ambulatory Visit: Payer: Self-pay

## 2021-01-26 DIAGNOSIS — L97522 Non-pressure chronic ulcer of other part of left foot with fat layer exposed: Secondary | ICD-10-CM | POA: Diagnosis not present

## 2021-01-26 DIAGNOSIS — T8133XA Disruption of traumatic injury wound repair, initial encounter: Secondary | ICD-10-CM | POA: Diagnosis not present

## 2021-01-27 ENCOUNTER — Ambulatory Visit (HOSPITAL_COMMUNITY): Payer: 59 | Attending: Orthopedic Surgery | Admitting: Physical Therapy

## 2021-01-27 ENCOUNTER — Encounter (HOSPITAL_COMMUNITY): Payer: Self-pay | Admitting: Physical Therapy

## 2021-01-27 DIAGNOSIS — R262 Difficulty in walking, not elsewhere classified: Secondary | ICD-10-CM | POA: Diagnosis not present

## 2021-01-27 DIAGNOSIS — M6281 Muscle weakness (generalized): Secondary | ICD-10-CM | POA: Diagnosis not present

## 2021-01-27 NOTE — Progress Notes (Signed)
TAVIS, KRING ([id number]) Visit Report for 01/26/2021 Arrival Information Details Patient Name: NICHOLS, CORTER. Date of Service: 01/26/2021 11:15 AM Medical Record Number: [id number] Patient Account Number: [id number] Date of Birth/Sex: 1991/08/21 (30 y.o. M) Treating RN: Hansel Feinstein Primary Care Cena Bruhn: Lilyan Punt Other Clinician: Referring Latiana Tomei: Lilyan Punt Treating Angelia Hazell/Extender: Altamese [place] in Treatment: 9 Visit Information History Since Last Visit Added or deleted any medications: No Patient Arrived: Crutches Had a fall or experienced change in No Arrival Time: 11:22 activities of daily living that may affect Accompanied By: friend risk of falls: Transfer Assistance: None Hospitalized since last visit: No Patient Identification Verified: Yes Has Dressing in Place as Prescribed: Yes Secondary Verification Process Completed: Yes Pain Present Now: No Patient Has Alerts: Yes Patient Alerts: Patient on Blood Thinner Eliquis NOT DIABETIC Electronic Signature(s) Signed: 01/27/2021 8:24:39 AM By: Elliot Gurney, BSN, RN, CWS, Kim RN, BSN Previous Signature: 01/26/2021 2:11:15 PM Version By: Hansel Feinstein Entered By: Elliot Gurney, BSN, RN, CWS, Kim on 01/27/2021 08:24:39 [name] ([id number]) -------------------------------------------------------------------------------- Clinic Level of Care Assessment Details Patient Name: [name] Date of Service: 01/26/2021 11:15 AM Medical Record Number: [id number] Patient Account Number: [id number] Date of Birth/Sex: 1991-05-07 (30 y.o. M) Treating RN: Rogers Blocker Primary Care Hiep Ollis: Lilyan Punt Other Clinician: Referring Jaylon Grode: Lilyan Punt Treating Loney Domingo/Extender: Altamese [place] in Treatment: 9 Clinic Level of Care Assessment Items TOOL 1 Quantity Score []  - Use when EandM and Procedure is performed on INITIAL visit 0 ASSESSMENTS - Nursing Assessment / Reassessment []  - General Physical  Exam (combine w/ comprehensive assessment (listed just below) when performed on new 0 pt. evals) []  - 0 Comprehensive Assessment (HX, ROS, Risk Assessments, Wounds Hx, etc.) ASSESSMENTS - Wound and Skin Assessment / Reassessment []  - Dermatologic / Skin Assessment (not related to wound area) 0 ASSESSMENTS - Ostomy and/or Continence Assessment and Care []  - Incontinence Assessment and Management 0 []  - 0 Ostomy Care Assessment and Management (repouching, etc.) PROCESS - Coordination of Care []  - Simple Patient / Family Education for ongoing care 0 []  - 0 Complex (extensive) Patient / Family Education for ongoing care []  - 0 Staff obtains , Records, Test Results / Process Orders []  - 0 Staff telephones HHA, Nursing Homes / Clarify orders / etc []  - 0 Routine Transfer to another Facility (non-emergent condition) []  - 0 Routine Hospital Admission (non-emergent condition) []  - 0 New Admissions / / Ordering NPWT, Apligraf, etc. []  - 0 Emergency Hospital Admission (emergent condition) PROCESS - Special Needs []  - Pediatric / Minor Patient Management 0 []  - 0 Isolation Patient Management []  - 0 Hearing / Language / Visual special needs []  - 0 Assessment of Community assistance (transportation, D/C planning, etc.) []  - 0 Additional assistance / Altered mentation []  - 0 Support Surface(s) Assessment (bed, cushion, seat, etc.) INTERVENTIONS - Miscellaneous []  - External ear exam 0 []  - 0 Patient Transfer (multiple staff / / Similar devices) []  - 0 Simple Staple / Suture removal (25 or less) []  - 0 Complex Staple / Suture removal (26 or more) []  - 0 Hypo/Hyperglycemic Management (do not check if billed separately) []  - 0 Ankle / Brachial Index (ABI) - do not check if billed separately Has the patient been seen at the hospital within the last three years: Yes Total Score: 0 Level Of Care: ____  ( ) Electronic Signature(s) Signed: 01/27/2021 11:32:50 AM By: , BSN, RN, CWS, Kim RN, BSN  Previous Signature: 01/26/2021 1:03:43 PM Version By: Phillis Haggis, Dondra Prader RN Entered By: Elliot Gurney, BSN, RN, CWS, Kim on 01/27/2021 08:26:40 [name] ([id number]) -------------------------------------------------------------------------------- Encounter Discharge Information Details Patient Name: HEIDI, LEMAY A. Date of Service: 01/26/2021 11:15 AM Medical Record Number: [id number] Patient Account Number: [id number] Date of Birth/Sex: Jan 02, 1991 (30 y.o. M) Treating RN: Yevonne Pax Primary Care Ramadan Couey: Lilyan Punt Other Clinician: Referring Fatima Fedie: Lilyan Punt Treating Macauley Mossberg/Extender: Altamese [place] in Treatment: 9 Encounter Discharge Information Items Post Procedure Vitals Discharge Condition: Stable Temperature (F): 98.3 Ambulatory Status: Crutches Pulse (bpm): 59 Discharge Destination: Home Respiratory Rate (breaths/min): 16 Transportation: Private Auto Blood Pressure (mmHg): 115/69 Accompanied By: self Schedule Follow-up Appointment: Yes Clinical Summary of Care: Patient Declined Electronic Signature(s) Signed: 01/27/2021 8:28:49 AM By: Elliot Gurney, BSN, RN, CWS, Kim RN, BSN Entered By: Elliot Gurney, BSN, RN, CWS, Kim on 01/27/2021 78:46:96 [name] ([id number]) -------------------------------------------------------------------------------- Lower Extremity Assessment Details Patient Name: [name] Date of Service: 01/26/2021 11:15 AM Medical Record Number: [id number] Patient Account Number: [id number] Date of Birth/Sex: [birth year] (30 y.o. M) Treating RN: Hansel Feinstein Primary Care Imanni Burdine: Lilyan Punt Other Clinician: Referring Shamarcus Hoheisel: Lilyan Punt Treating Jhordan Mckibben/Extender: Altamese [place] in Treatment: 9 Edema Assessment Assessed: [Left: Yes] [Right: No] Edema: [Left: N] [Right: o] Electronic Signature(s) Signed: 01/27/2021  8:25:22 AM By: Elliot Gurney, BSN, RN, CWS, Kim RN, BSN Signed: 01/27/2021 11:06:31 AM By: Hansel Feinstein Previous Signature: 01/26/2021 2:11:15 PM Version By: Hansel Feinstein Entered By: Elliot Gurney, BSN, RN, CWS, Kim on 01/27/2021 08:25:22 [name] ([id number]) -------------------------------------------------------------------------------- Multi Wound Chart Details Patient Name: LOYDE, ORTH A. Date of Service: 01/26/2021 11:15 AM Medical Record Number: [id number] Patient Account Number: [id number] Date of Birth/Sex: 08/04/1991 (30 y.o. M) Treating RN: Rogers Blocker Primary Care Malarie Tappen: Lilyan Punt Other Clinician: Referring Victorine Mcnee: Lilyan Punt Treating Keerat Denicola/Extender: Altamese  in Treatment: 9 Vital Signs Height(in): 72 Pulse(bpm): 59 Weight(lbs): 185 Blood Pressure(mmHg): 115/69 Body Mass Index(BMI): 25 Temperature(F): 98.3 Respiratory Rate(breaths/min): 16 Photos: Wound Location: Left, Medial Foot Left, Lateral Foot Left, Distal Flank Wounding Event: Surgical Injury Surgical Injury Surgical Injury Primary Etiology: Trauma, Other Trauma, Other To be determined Comorbid History: Neuropathy Neuropathy Neuropathy Date Acquired: 06/04/2020 01/14/2021 12/31/2020 Weeks of Treatment: 9 1 0 Wound Status: Open Open Open Measurements L x W x D (cm) 0.6x0.5x0.5 0.3x0.2x0.1 0.6x10x0.1 Area (cm) : 0.236 0.047 4.712 Volume (cm) : 0.118 0.005 0.471 % Reduction in Area: 89.00% 88.00% N/A % Reduction in Volume: 86.20% 87.20% N/A Classification: Full Thickness Without Exposed Full Thickness Without Exposed Full Thickness Without Exposed Support Structures Support Structures Support Structures Exudate Amount: Medium Medium Medium Exudate Type: Serosanguineous Serosanguineous Serosanguineous Exudate Color: red, brown red, brown red, brown Granulation Amount: Small (1-33%) Large (67-100%) Medium (34-66%) Granulation Quality: Pink Red Red, Pink Necrotic Amount: Large (67-100%)  Small (1-33%) Medium (34-66%) Necrotic Tissue: Adherent Slough Eschar Adherent Slough Exposed Structures: Fat Layer (Subcutaneous Tissue): Fat Layer (Subcutaneous Tissue): Fat Layer (Subcutaneous Tissue): Yes Yes Yes Fascia: No Fascia: No Fascia: No Tendon: No Tendon: No Tendon: No Muscle: No Muscle: No Muscle: No Joint: No Joint: No Joint: No Bone: No Bone: No Bone: No Epithelialization: N/A Small (1-33%) N/A Debridement: Debridement - Selective/Open N/A N/A Wound Pre-procedure Verification/Time 11:44 N/A N/A Out Taken: Tissue Debrided: Slough N/A N/A Level: Non-Viable Tissue N/A N/A Debridement Area (sq cm): 0.3 N/A N/A Instrument: Curette N/A N/A Bleeding: Minimum N/A N/A Hemostasis Achieved: Pressure N/A N/A Debridement Treatment Procedure was tolerated well N/A N/A Response: Post  Debridement 0.6x0.5x0.6 N/A N/A Measurements L x W x D (cm) [name], [place]as[place] A. ([id number]) Post Debridement Volume: 0.141 N/A N/A (cm) Procedures Performed: Debridement N/A N/A Treatment Notes Wound #1 (Foot) Wound Laterality: Left, Medial Cleanser Normal Saline Discharge Instruction: Wash your hands with soap and water. Remove old dressing, discard into plastic bag and place into trash. Cleanse the wound with Normal Saline prior to applying a clean dressing using gauze sponges, not tissues or cotton balls. Do not scrub or use excessive force. Pat dry using gauze sponges, not tissue or cotton balls. Peri-Wound Care Topical Primary Dressing Endoform 2x2 (in/in) Discharge Instruction: Moisten with hydrogel, apply as directed Secondary Dressing ABD Pad 5x9 (in/in) Discharge Instruction: Cover with ABD pad Secured With 22M ACE Elastic Bandage With VELCRO Brand Closure, 4 (in) Compression Wrap Compression Stockings Add-Ons Wound #4 (Foot) Wound Laterality: Left, Lateral Cleanser Normal Saline Discharge Instruction: Wash your hands with soap and water. Remove old dressing, discard  into plastic bag and place into trash. Cleanse the wound with Normal Saline prior to applying a clean dressing using gauze sponges, not tissues or cotton balls. Do not scrub or use excessive force. Pat dry using gauze sponges, not tissue or cotton balls. Peri-Wound Care Topical Primary Dressing Endoform 2x2 (in/in) Discharge Instruction: Moisten with hydrogel, apply as directed Secondary Dressing ABD Pad 5x9 (in/in) Discharge Instruction: Cover with ABD pad Secured With 22M ACE Elastic Bandage With VELCRO Brand Closure, 4 (in) Compression Wrap Compression Stockings Add-Ons Wound #5 (Flank) Wound Laterality: Left, Distal Cleanser Normal Saline [name], Kalee A. ([id number]) Discharge Instruction: Wash your hands with soap and water. Remove old dressing, discard into plastic bag and place into trash. Cleanse the wound with Normal Saline prior to applying a clean dressing using gauze sponges, not tissues or cotton balls. Do not scrub or use excessive force. Pat dry using gauze sponges, not tissue or cotton balls. Peri-Wound Care Topical Primary Dressing Silvercel Small 2x2 (in/in) Discharge Instruction: Apply Silvercel Small 2x2 (in/in) as instructed Secondary Dressing ABD Pad 5x9 (in/in) Discharge Instruction: Cover with ABD pad Secured With 22M Medipore H Soft Cloth Surgical Tape, 2x2 (in/yd) Discharge Instruction: Secure ABD Compression Wrap Compression Stockings Add-Ons Electronic Signature(s) Signed: 01/27/2021 8:27:16 AM By: Elliot GurneyWoody, BSN, RN, CWS, Kim RN, BSN Previous Signature: 01/27/2021 8:25:53 AM Version By: Elliot GurneyWoody, BSN, RN, CWS, Kim RN, BSN Previous Signature: 01/26/2021 4:14:58 PM Version By: Baltazar Najjarobson, Michael MD Entered By: Elliot GurneyWoody, BSN, RN, CWS, Kim on 01/27/2021 08:27:16 Verna CzechWALKER, Hadrian A. ([id number]) -------------------------------------------------------------------------------- Multi-Disciplinary Care Plan Details Patient Name: [name], Alcides A. Date of Service: 01/26/2021  11:15 AM Medical Record Number: [id number] Patient Account Number: [id number] Date of Birth/Sex: 08/23/1990 (30 y.o. M) Treating RN: Rogers BlockerSanchez, Kenia Primary Care Juliet Vasbinder: Lilyan PuntLuking, Scott Other Clinician: Referring Pearline Yerby: Lilyan PuntLuking, Scott Treating Macaila Tahir/Extender: Altamese CarolinaOBSON, MICHAEL G Weeks in Treatment: 9 Active Inactive Peripheral Neuropathy Nursing Diagnoses: Knowledge deficit related to disease process and management of peripheral neurovascular dysfunction Goals: Patient/caregiver will verbalize understanding of disease process and disease management Date Initiated: 11/24/2020 Target Resolution Date: 12/01/2020 Goal Status: Active Interventions: Assess signs and symptoms of neuropathy upon admission and as needed Notes: Wound/Skin Impairment Nursing Diagnoses: Impaired tissue integrity Goals: Patient/caregiver will verbalize understanding of skin care regimen Date Initiated: 11/24/2020 Target Resolution Date: 11/24/2020 Goal Status: Active Interventions: Assess patient/caregiver ability to obtain necessary supplies Assess ulceration(s) every visit Treatment Activities: Skin care regimen initiated : 11/24/2020 Topical wound management initiated : 11/24/2020 Notes: Electronic Signature(s) Signed: 01/27/2021 8:25:32 AM By: Elliot GurneyWoody, BSN, RN, CWS, Kim  RN, BSN Signed: 01/27/2021 4:29:08 PM By: Phillis Haggis, Dondra Prader RN Previous Signature: 01/26/2021 1:03:43 PM Version By: Phillis Haggis, Dondra Prader RN Entered By: Elliot Gurney, BSN, RN, CWS, Kim on 01/27/2021 08:25:32 [name] ([id number]) -------------------------------------------------------------------------------- Pain Assessment Details Patient Name: [name] Date of Service: 01/26/2021 11:15 AM Medical Record Number: [id number] Patient Account Number: [id number] Date of Birth/Sex: 1991-05-29 (30 y.o. M) Treating RN: Hansel Feinstein Primary Care Anisha Starliper: Lilyan Punt Other Clinician: Referring Jaeden Westbay: Lilyan Punt Treating  Jaylynn Mcaleer/Extender: Altamese [place] in Treatment: 9 Active Problems Location of Pain Severity and Description of Pain Patient Has Paino No Site Locations Rate the pain. Current Pain Level: 0 Pain Management and Medication Current Pain Management: Electronic Signature(s) Signed: 01/27/2021 8:24:58 AM By: Elliot Gurney, BSN, RN, CWS, Kim RN, BSN Signed: 01/27/2021 11:06:31 AM By: Hansel Feinstein Previous Signature: 01/26/2021 2:11:15 PM Version By: Hansel Feinstein Entered By: Elliot Gurney BSN, RN, CWS, Kim on 01/27/2021 08:24:58 [name] ([id number]) -------------------------------------------------------------------------------- Patient/Caregiver Education Details Patient Name: [name] Date of Service: 01/26/2021 11:15 AM Medical Record Number: [id number] Patient Account Number: [id number] Date of Birth/Gender: 08/28/1990 (30 y.o. M) Treating RN: Rogers Blocker Primary Care Physician: Lilyan Punt Other Clinician: Referring Physician: Lilyan Punt Treating Physician/Extender: Altamese [place] in Treatment: 9 Education Assessment Education Provided To: Patient Education Topics Provided Wound/Skin Impairment: Methods: Explain/Verbal Responses: State content correctly [employer]) Signed: 01/27/2021 11:32:50 AM By: Elliot Gurney, BSN, RN, CWS, Kim RN, BSN Previous Signature: 01/26/2021 1:03:43 PM Version By: Phillis Haggis, Dondra Prader RN Entered By: Elliot Gurney, BSN, RN, CWS, Kim on 01/27/2021 62:95:28 [name] ([id number]) -------------------------------------------------------------------------------- Wound Assessment Details Patient Name: [name] Date of Service: 01/26/2021 11:15 AM Medical Record Number: [id number] Patient Account Number: [id number] Date of Birth/Sex: Oct 19, 1990 (30 y.o. M) Treating RN: Hansel Feinstein Primary Care Nayelie Gionfriddo: Lilyan Punt Other Clinician: Referring Brendin Situ: Lilyan Punt Treating Kerolos Nehme/Extender: Rowan Blase in Treatment:  9 Wound Status Wound Number: 1 Primary Etiology: Trauma, Other Wound Location: Left, Medial Foot Wound Status: Open Wounding Event: Surgical Injury Comorbid History: Neuropathy Date Acquired: 06/04/2020 Weeks Of Treatment: 9 Clustered Wound: No Photos Wound Measurements Length: (cm) 0.6 Width: (cm) 0.5 Depth: (cm) 0.5 Area: (cm) 0.236 Volume: (cm) 0.118 % Reduction in Area: 89% % Reduction in Volume: 86.2% Tunneling: No Undermining: No Wound Description Classification: Full Thickness Without Exposed Support Structures Exudate Amount: Medium Exudate Type: Serosanguineous Exudate Color: red, brown Foul Odor After Cleansing: No Slough/Fibrino Yes Wound Bed Granulation Amount: Small (1-33%) Exposed Structure Granulation Quality: Pink Fascia Exposed: No Necrotic Amount: Large (67-100%) Fat Layer (Subcutaneous Tissue) Exposed: Yes Necrotic Quality: Adherent Slough Tendon Exposed: No Muscle Exposed: No Joint Exposed: No Bone Exposed: No Treatment Notes Wound #1 (Foot) Wound Laterality: Left, Medial Cleanser Normal Saline Discharge Instruction: Wash your hands with soap and water. Remove old dressing, discard into plastic bag and place into trash. Cleanse the wound with Normal Saline prior to applying a clean dressing using gauze sponges, not tissues or cotton balls. Do not scrub or use excessive force. Pat dry using gauze sponges, not tissue or cotton balls. ALANMICHAEL, BARMORE ([id number]) Peri-Wound Care Topical Primary Dressing Endoform 2x2 (in/in) Discharge Instruction: Moisten with hydrogel, apply as directed Secondary Dressing ABD Pad 5x9 (in/in) Discharge Instruction: Cover with ABD pad Secured With 37M ACE Elastic Bandage With VELCRO Brand Closure, 4 (in) Compression Wrap Compression Stockings Add-Ons Electronic Signature(s) Signed: 01/26/2021 2:11:15 PM By: Hansel Feinstein Entered By: Hansel Feinstein on 01/26/2021 11:33:18 Kem, Mubashir A.  ([id number]) --------------------------------------------------------------------------------  Wound Assessment Details Patient Name: WASHINGTON, WHEDBEE A. Date of Service: 01/26/2021 11:15 AM Medical Record Number: [id number] Patient Account Number: [id number] Date of Birth/Sex: 1990-10-17 (30 y.o. M) Treating RN: Hansel Feinstein Primary Care Griselda Tosh: Lilyan Punt Other Clinician: Referring Virgin Zellers: Lilyan Punt Treating Eri Mcevers/Extender: Rowan Blase in Treatment: 9 Wound Status Wound Number: 4 Primary Etiology: Trauma, Other Wound Location: Left, Lateral Foot Wound Status: Open Wounding Event: Surgical Injury Comorbid History: Neuropathy Date Acquired: 01/14/2021 Weeks Of Treatment: 1 Clustered Wound: No Photos Wound Measurements Length: (cm) 0.3 Width: (cm) 0.2 Depth: (cm) 0.1 Area: (cm) 0.047 Volume: (cm) 0.005 % Reduction in Area: 88% % Reduction in Volume: 87.2% Epithelialization: Small (1-33%) Tunneling: No Undermining: No Wound Description Classification: Full Thickness Without Exposed Support Structures Exudate Amount: Medium Exudate Type: Serosanguineous Exudate Color: red, brown Foul Odor After Cleansing: No Wound Bed Granulation Amount: Large (67-100%) Exposed Structure Granulation Quality: Red Fascia Exposed: No Necrotic Amount: Small (1-33%) Fat Layer (Subcutaneous Tissue) Exposed: Yes Necrotic Quality: Eschar Tendon Exposed: No Muscle Exposed: No Joint Exposed: No Bone Exposed: No Treatment Notes Wound #4 (Foot) Wound Laterality: Left, Lateral Cleanser Normal Saline Discharge Instruction: Wash your hands with soap and water. Remove old dressing, discard into plastic bag and place into trash. Cleanse the wound with Normal Saline prior to applying a clean dressing using gauze sponges, not tissues or cotton balls. Do not scrub or use excessive force. Pat dry using gauze sponges, not tissue or cotton balls. TAINO, MAERTENS ([id number]) Peri-Wound  Care Topical Primary Dressing Endoform 2x2 (in/in) Discharge Instruction: Moisten with hydrogel, apply as directed Secondary Dressing ABD Pad 5x9 (in/in) Discharge Instruction: Cover with ABD pad Secured With 75M ACE Elastic Bandage With VELCRO Brand Closure, 4 (in) Compression Wrap Compression Stockings Add-Ons Electronic Signature(s) Signed: 01/26/2021 2:11:15 PM By: Hansel Feinstein Entered By: Hansel Feinstein on 01/26/2021 11:35:24 [name] ([id number]) -------------------------------------------------------------------------------- Wound Assessment Details Patient Name: [name] Date of Service: 01/26/2021 11:15 AM Medical Record Number: [id number] Patient Account Number: [id number] Date of Birth/Sex: 07-28-1991 (30 y.o. M) Treating RN: Hansel Feinstein Primary Care Lilianna Case: Lilyan Punt Other Clinician: Referring Willliam Pettet: Lilyan Punt Treating Aniyla Harling/Extender: Rowan Blase in Treatment: 9 Wound Status Wound Number: 5 Primary Etiology: To be determined Wound Location: Left, Distal Flank Wound Status: Open Wounding Event: Surgical Injury Comorbid History: Neuropathy Date Acquired: 12/31/2020 Weeks Of Treatment: 0 Clustered Wound: No Photos Wound Measurements Length: (cm) 0.6 Width: (cm) 10 Depth: (cm) 0.1 Area: (cm) 4.712 Volume: (cm) 0.471 % Reduction in Area: % Reduction in Volume: Tunneling: No Undermining: No Wound Description Classification: Full Thickness Without Exposed Support Structu Exudate Amount: Medium Exudate Type: Serosanguineous Exudate Color: red, brown res Foul Odor After Cleansing: No Slough/Fibrino Yes Wound Bed Granulation Amount: Medium (34-66%) Exposed Structure Granulation Quality: Red, Pink Fascia Exposed: No Necrotic Amount: Medium (34-66%) Fat Layer (Subcutaneous Tissue) Exposed: Yes Necrotic Quality: Adherent Slough Tendon Exposed: No Muscle Exposed: No Joint Exposed: No Bone Exposed: No Electronic  Signature(s) Signed: 01/26/2021 2:11:15 PM By: Hansel Feinstein Entered By: Hansel Feinstein on 01/26/2021 11:39:30 [name] ([id number]) -------------------------------------------------------------------------------- Vitals Details Patient Name: [name] Date of Service: 01/26/2021 11:15 AM Medical Record Number: [id number] Patient Account Number: [id number] Date of Birth/Sex: Sep 16, 1990 (30 y.o. M) Treating RN: Hansel Feinstein Primary Care Mattilyn Crites: Lilyan Punt Other Clinician: Referring Bekah Igoe: Lilyan Punt Treating Rhythm Wigfall/Extender: Altamese [place] in Treatment: 9 Vital Signs Time Taken: 11:25 Temperature (F): 98.3 Height (in): 72 Pulse (bpm): 59 Weight (lbs): 185  Respiratory Rate (breaths/min): 16 Body Mass Index (BMI): 25.1 Blood Pressure (mmHg): 115/69 Reference Range: 80 - 120 mg / dl Electronic Signature(s) Signed: 01/27/2021 8:24:50 AM By: Elliot Gurney, BSN, RN, CWS, Kim RN, BSN Previous Signature: 01/26/2021 2:11:15 PM Version By: Hansel Feinstein Entered By: Elliot Gurney, BSN, RN, CWS, Kim on 01/27/2021 08:24:50
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
## 2021-01-27 NOTE — Progress Notes (Signed)
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 With VELCRO Brand Closure, 4 (in) 3 x Per Week/30 Days WOUND #5: - Flank Wound Laterality: Left, Distal Cleanser: Normal Saline 3 x Per Week/15 Days Discharge Instructions: Wash your hands with soap and water. Remove old dressing, discard into plastic bag and place into trash. Cleanse the wound with Normal Saline prior to applying a clean dressing using gauze sponges, not tissues or cotton balls. Do not scrub or use excessive force. Pat dry using gauze sponges, not tissue or cotton balls. Primary Dressing: Silvercel Small 2x2 (in/in) 3 x Per Week/15 Days Discharge Instructions: Apply Silvercel Small 2x2 (in/in) as instructed Secondary Dressing: ABD Pad 5x9 (in/in) 3 x Per Week/15 Days Discharge Instructions: Cover with ABD pad Secured With: 26M Medipore H Soft Cloth Surgical Tape, 2x2 (in/yd) 3 x Per Week/15 Days Discharge Instructions: Secure ABD 1. I am going to use endoform in both the foot wounds. He is not eligible for an advanced treatment product. 2. He will be changing these every second day we should have enough here to dress both wounds 3. Silver alginate ABDs and tape along his surgical site in the left hemipelvis fortunately at the moment this does not have any depth and I am hopeful that this will epithelialize. There did not appear to be any evidence of infection Electronic Signature(s) Signed: 01/27/2021 8:28:07 AM By: Elliot Gurney, BSN, RN, CWS, Kim RN, BSN Signed: 01/27/2021  9:15:30 AM By: Baltazar Najjar MD Previous Signature: 01/26/2021 4:14:58 PM Version By: Baltazar Najjar MD Entered By: Elliot Gurney, BSN, RN, CWS, Kim on 01/27/2021 69:67:89 [name] ([id number]) -------------------------------------------------------------------------------- SuperBill Details Patient Name: [name] Date of Service: 01/26/2021 Medical Record Number: [id number] Patient Account Number: [id number] Date of Birth/Sex: [birth year] (29 y.o. M) Treating RN: Huel Coventry Primary Care Provider: Lilyan Punt Other Clinician: Referring Provider: Lilyan Punt Treating Provider/Extender: Alt[place]e [place] in Treatment: 9 Diagnosis Coding ICD-10 Codes Code Description [phone] Non-pressure chronic ulcer of other part of left foot with other specified severity L97.521 Non-pressure chronic ulcer of other part of left foot limited to breakdown of skin T81.31XD Disruption of external operation (surgical) wound, not elsewhere classified, subsequent encounter T81.31XD Disruption of external operation (surgical) wound, not elsewhere classified, subsequent encounter 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 Facility Procedures CPT4 Code: 14431540 Description: 97597 - DEBRIDE WOUND 1ST 20 SQ CM OR < Modifier: Quantity: 1 CPT4 Code: Description: ICD-10 Diagnosis Description L97.528  Non-pressure chronic ulcer of other part of left foot with other specified Modifier: severity Quantity: Physician Procedures CPT4 Code: 1610960 Description: 97597 - WC PHYS DEBR WO ANESTH 20 SQ CM Modifier: Quantity: 1 CPT4 Code: Description: ICD-10 Diagnosis Description L97.528 Non-pressure chronic ulcer of other part of left foot with other specified Modifier: severity Quantity: Electronic Signature(s) Signed: 01/27/2021 8:26:50 AM By: Elliot Gurney, BSN, RN, CWS, Kim RN, BSN Signed: 01/27/2021 9:15:30 AM By: Baltazar Najjar MD Previous Signature: 01/26/2021 4:14:58 PM Version  By: Baltazar Najjar MD Entered By: Elliot Gurney, BSN, RN, CWS, Kim on 01/27/2021 45:40:98

## 2021-02-01 ENCOUNTER — Ambulatory Visit: Payer: 59 | Admitting: Family Medicine

## 2021-02-02 ENCOUNTER — Encounter (HOSPITAL_COMMUNITY): Payer: Self-pay

## 2021-02-02 ENCOUNTER — Other Ambulatory Visit: Payer: Self-pay

## 2021-02-02 ENCOUNTER — Encounter: Payer: 59 | Admitting: Internal Medicine

## 2021-02-02 ENCOUNTER — Ambulatory Visit (HOSPITAL_COMMUNITY): Payer: 59

## 2021-02-02 DIAGNOSIS — M6281 Muscle weakness (generalized): Secondary | ICD-10-CM | POA: Diagnosis not present

## 2021-02-02 DIAGNOSIS — R262 Difficulty in walking, not elsewhere classified: Secondary | ICD-10-CM

## 2021-02-02 DIAGNOSIS — L97522 Non-pressure chronic ulcer of other part of left foot with fat layer exposed: Secondary | ICD-10-CM | POA: Diagnosis not present

## 2021-02-02 DIAGNOSIS — T8133XA Disruption of traumatic injury wound repair, initial encounter: Secondary | ICD-10-CM | POA: Diagnosis not present
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
## 2021-02-02 NOTE — Patient Instructions (Signed)
Bridging    Slowly raise buttocks from floor, keeping stomach tight. Repeat 15 times per set. Do 3 sets per session.   http://orth.exer.us/1096   Copyright  VHI. All rights reserved.   Abduction: Side Leg Lift (Eccentric) - Side-Lying    Lie on side. Lift top leg slightly higher than shoulder level. Keep top leg straight with body, toes pointing forward. Slowly lower for 3-5 seconds. 10 reps per set, 3 sets per day, 4 days per week.  http://ecce.exer.us/62   Copyright  VHI. All rights reserved.   Straight Leg Raise (Prone)    Abdomen and head supported, keep left knee locked and raise leg at hip. Avoid arching low back. Repeat 10 times per set. Do 3 sets per session.   http://orth.exer.us/1112   Copyright  VHI. All rights reserved.   Knee Flexion    Bend knee as far as possible. Use ____ lbs on ankle. Repeat with other leg. Repeat 10 times. Do 3 sessions per day.  http://gt2.exer.us/389   Copyright  VHI. All rights reserved.

## 2021-02-03 ENCOUNTER — Ambulatory Visit (HOSPITAL_COMMUNITY): Payer: 59 | Admitting: Physical Therapy

## 2021-02-03 ENCOUNTER — Encounter (HOSPITAL_COMMUNITY): Payer: Self-pay | Admitting: Physical Therapy

## 2021-02-03 DIAGNOSIS — R262 Difficulty in walking, not elsewhere classified: Secondary | ICD-10-CM

## 2021-02-03 DIAGNOSIS — M6281 Muscle weakness (generalized): Secondary | ICD-10-CM

## 2021-02-03 NOTE — Progress Notes (Signed)
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 Remove old dressing, discard into plastic bag and place into trash. Cleanse the wound with Normal Saline prior to applying a clean dressing using gauze sponges, not tissues or cotton balls. Do not scrub or use excessive force. Pat dry using gauze sponges, not tissue or cotton balls. Primary Dressing: Endoform 2x2 (in/in) 3 x Per Week/30 Days Discharge Instructions: Moisten with hydrogel, apply as directed Secondary Dressing: ABD Pad 5x9 (in/in) (Generic) 3 x Per Week/30 Days Discharge Instructions: Cover with ABD pad Secured With: Sock 3 x Per Week/30 Days WOUND #4: - Foot Wound Laterality: Left, Lateral Cleanser: Normal Saline 3 x Per Week/30 Days Discharge Instructions: Wash your hands with soap and water. Remove old dressing, discard into plastic bag  and place into trash. Cleanse the wound with Normal Saline prior to applying a clean dressing using gauze sponges, not tissues or cotton balls. Do not scrub or use excessive force. Pat dry using gauze sponges, not tissue or cotton balls. Primary Dressing: Silvercel Small 2x2 (in/in) 3 x Per Week/30 Days Discharge Instructions: Apply Silvercel Small 2x2 (in/in) as instructed Secondary Dressing: ABD Pad 5x9 (in/in) (Generic) 3 x Per Week/30 Days Discharge Instructions: Cover with ABD pad Secured With: Sock 3 x Per Week/30 Days WOUND #5: - Flank Wound Laterality: Left, Distal Cleanser: Normal Saline 3 x Per Week/15 Days Discharge Instructions: Wash your hands with soap and water. Remove old dressing, discard into plastic bag and place into trash. Cleanse the wound with Normal Saline prior to applying a clean dressing using gauze sponges, not tissues or cotton balls. Do not scrub or use excessive force. Pat dry using gauze sponges, not tissue or cotton balls. Primary Dressing: Silvercel Small 2x2 (in/in) 3 x Per Week/15 Days Discharge Instructions: Apply Silvercel Small 2x2 (in/in) as instructed Secondary Dressing: ABD Pad 5x9 (in/in) 3 x Per Week/15 Days Discharge Instructions: Cover with ABD pad Secured With: 29M Medipore H Soft Cloth Surgical Tape, 2x2 (in/yd) 3 x Per Week/15 Days Discharge Instructions: Secure ABD 1. I am continuing with the endoform to the areas on the left foot and silver alginate to the harvest site on the left flank. 2.  We are supplying the endoform so we will have to come back weekly. This would be expensive Electronic Signature(s) Signed: 02/02/2021 4:10:05 PM By: Brian Najjar MD Entered By: [name] on 02/02/2021 12:59:06 [name] ([id number]) -------------------------------------------------------------------------------- SuperBill Details Patient Name: Brian Hams A. Date of Service: 02/02/2021 Medical Record Number: [id number] Patient Account  Number: [id number] Date of Birth/Sex: Dec 23, 1990 (29 y.o. M) Treating RN: [name] Primary Care Provider: Lilyan Lee Other Clinician: Referring Provider: Lilyan Lee Treating Provider/Extender: Brian Frierson in Treatment: 10 Diagnosis Coding ICD-10 Codes Code Description [phone] Non-pressure chronic ulcer of other part of left foot with other specified severity L97.521 Non-pressure chronic ulcer of other part of left foot limited to breakdown of skin T81.31XD Disruption of external operation (surgical) wound, not elsewhere classified, subsequent encounter T81.31XD Disruption of external operation (surgical) wound, not elsewhere classified, subsequent encounter 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 Facility Procedures CPT4 Code: 21308657 Description: 97597 - DEBRIDE WOUND 1ST 20 SQ CM OR < Modifier: Quantity: 1 CPT4 Code: Description: ICD-10 Diagnosis Description L97.528 Non-pressure chronic ulcer of other part of left foot with other specified Modifier: severity Quantity: Physician Procedures CPT4 Code: 8469629 Description: 97597 - WC PHYS DEBR WO ANESTH 20 SQ CM Modifier: Quantity: 1 CPT4 Code: Description: ICD-10 Diagnosis Description L97.528 Non-pressure chronic ulcer of other part of left foot with other specified Modifier: severity Quantity: Electronic Signature(s) Signed: 02/02/2021 4:10:05 PM By: Brian Najjar MD Entered By: [name] on 02/02/2021 12:59:39
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
## 2021-02-07 ENCOUNTER — Encounter (HOSPITAL_COMMUNITY): Payer: Self-pay | Admitting: Physical Therapy

## 2021-02-07 ENCOUNTER — Other Ambulatory Visit: Payer: Self-pay

## 2021-02-07 ENCOUNTER — Ambulatory Visit (HOSPITAL_COMMUNITY): Payer: 59 | Admitting: Physical Therapy

## 2021-02-07 DIAGNOSIS — R262 Difficulty in walking, not elsewhere classified: Secondary | ICD-10-CM

## 2021-02-07 DIAGNOSIS — M6281 Muscle weakness (generalized): Secondary | ICD-10-CM | POA: Diagnosis not present
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
## 2021-02-07 NOTE — Progress Notes (Signed)
ELAZAR, ARGABRIGHT ([id number]) Visit Report for 02/02/2021 Arrival Information Details Patient Name: [name], [name]. Date of Service: 02/02/2021 11:15 AM Medical Record Number: [id number] Patient Account Number: [id number] Date of Birth/Sex: 12/13/1990 (29 y.o. M) Treating RN: Hansel Feinstein Primary Care Miklos Bidinger: Lilyan Punt Other Clinician: Referring Jenisse Vullo: Lilyan Punt Treating Khristie Sak/Extender: Altamese [place] in Treatment: 10 Visit Information History Since Last Visit Added or deleted any medications: No Patient Arrived: Crutches Had a fall or experienced change in No Arrival Time: 11:36 activities of daily living that may affect Accompanied By: mother risk of falls: Transfer Assistance: None Hospitalized since last visit: No Patient Has Alerts: Yes Has Dressing in Place as Prescribed: Yes Patient Alerts: Patient on Blood Thinner Pain Present Now: No Eliquis NOT DIABETIC Electronic Signature(s) Signed: 02/07/2021 11:12:12 AM By: Hansel Feinstein Entered By: Hansel Feinstein on 02/02/2021 11:36:24 [name] ([id number]) -------------------------------------------------------------------------------- Encounter Discharge Information Details Patient Name: [name] Date of Service: 02/02/2021 11:15 AM Medical Record Number: [id number] Patient Account Number: [id number] Date of Birth/Sex: 15-Apr-1991 (29 y.o. M) Treating RN: Huel Coventry Primary Care Shirla Hodgkiss: Lilyan Punt Other Clinician: Referring Jocee Kissick: Lilyan Punt Treating Edy Mcbane/Extender: Altamese [place] in Treatment: 10 Encounter Discharge Information Items Post Procedure Vitals Discharge Condition: Stable Temperature (F): 98 Ambulatory Status: Crutches Pulse (bpm): 68 Discharge Destination: Home Respiratory Rate (breaths/min): 16 Transportation: Private Auto Blood Pressure (mmHg): 121/73 Accompanied By: self Schedule Follow-up Appointment: Yes Clinical Summary of Care: Electronic  Signature(s) Signed: 02/02/2021 5:10:04 PM By: Elliot Gurney, BSN, RN, CWS, Kim RN, BSN Entered By: Elliot Gurney, BSN, RN, CWS, Kim on 02/02/2021 12:29:38 [name] ([id number]) -------------------------------------------------------------------------------- Lower Extremity Assessment Details Patient Name: [name] Date of Service: 02/02/2021 11:15 AM Medical Record Number: [id number] Patient Account Number: [id number] Date of Birth/Sex: October 10, 1990 (29 y.o. M) Treating RN: Hansel Feinstein Primary Care Vineeth Fell: Lilyan Punt Other Clinician: Referring Alaine Loughney: Lilyan Punt Treating Bowe Sidor/Extender: Maxwell Caul Weeks in Treatment: 10 Edema Assessment Assessed: [Left: Yes] [Right: No] [Left: Edema] [Right: :] Electronic Signature(s) Signed: 02/07/2021 11:12:12 AM By: Hansel Feinstein Entered By: Hansel Feinstein on 02/02/2021 11:48:00 [name] ([id number]) -------------------------------------------------------------------------------- Multi Wound Chart Details Patient Name: [name] Date of Service: 02/02/2021 11:15 AM Medical Record Number: [id number] Patient Account Number: [id number] Date of Birth/Sex: 1990-12-11 (29 y.o. M) Treating RN: Huel Coventry Primary Care Jalee Saine: Lilyan Punt Other Clinician: Referring Bailee Thall: Lilyan Punt Treating Decklyn Hyder/Extender: Altamese  in Treatment: 10 Vital Signs Height(in): 72 Pulse(bpm): 68 Weight(lbs): 185 Blood Pressure(mmHg): 121/73 Body Mass Index(BMI): 25 Temperature(F): 98 Respiratory Rate(breaths/min): 16 Photos: Wound Location: Left, Medial Foot Left, Lateral Foot Left, Distal Flank Wounding Event: Surgical Injury Surgical Injury Surgical Injury Primary Etiology: Trauma, Other Trauma, Other Open Surgical Wound Comorbid History: Neuropathy Neuropathy Neuropathy Date Acquired: 06/04/2020 01/14/2021 12/31/2020 Weeks of Treatment: 10 2 1  Wound Status: Open Open Open Measurements L x W x D (cm) 0.6x0.3x0.5  0.3x0.2x0.1 0.6x6x0.1 Area (cm) : 0.141 0.047 2.827 Volume (cm) : 0.071 0.005 0.283 % Reduction in Area: 93.40% 88.00% 40.00% % Reduction in Volume: 91.70% 87.20% 39.90% Classification: Full Thickness Without Exposed Full Thickness Without Exposed Full Thickness Without Exposed Support Structures Support Structures Support Structures Exudate Amount: Medium Medium Medium Exudate Type: Serosanguineous Serosanguineous Serosanguineous Exudate Color: red, brown red, brown red, brown Granulation Amount: Small (1-33%) None Present (0%) Large (67-100%) Granulation Quality: Pink N/A Red, Pink Necrotic Amount: Large (67-100%) Large (67-100%) Small (1-33%) Necrotic Tissue: Adherent Slough Eschar Adherent Slough Exposed Structures: Fat Layer (Subcutaneous  Tissue): Fat Layer (Subcutaneous Tissue): Fat Layer (Subcutaneous Tissue): Yes Yes Yes Fascia: No Fascia: No Fascia: No Tendon: No Tendon: No Tendon: No Muscle: No Muscle: No Muscle: No Joint: No Joint: No Joint: No Bone: No Bone: No Bone: No Epithelialization: N/A Small (1-33%) Small (1-33%) Debridement: Debridement - Excisional Debridement - Selective/Open N/A Wound Pre-procedure Verification/Time 12:22 12:22 N/A Out Taken: Tissue Debrided: Subcutaneous, Slough Necrotic/Eschar N/A Level: Skin/Subcutaneous Tissue Non-Viable Tissue N/A Debridement Area (sq cm): 0.18 0.06 N/A Instrument: Curette Curette N/A Bleeding: Minimum Minimum N/A Hemostasis Achieved: Pressure Pressure N/A Debridement Treatment Procedure was tolerated well Procedure was tolerated well N/A Response: Post Debridement 0.6x0.5x0.5 0.3x0.2x0.1 N/A Measurements L x W x D (cm) Suire, Demarques A. ([id number]) Post Debridement Volume: 0.118 0.005 N/A (cm) Procedures Performed: Debridement Debridement N/A Treatment Notes Wound #1 (Foot) Wound Laterality: Left, Medial Cleanser Normal Saline Discharge Instruction: Wash your hands with soap and water. Remove old  dressing, discard into plastic bag and place into trash. Cleanse the wound with Normal Saline prior to applying a clean dressing using gauze sponges, not tissues or cotton balls. Do not scrub or use excessive force. Pat dry using gauze sponges, not tissue or cotton balls. Peri-Wound Care Topical Primary Dressing Endoform 2x2 (in/in) Discharge Instruction: Moisten with hydrogel, apply as directed Secondary Dressing ABD Pad 5x9 (in/in) Discharge Instruction: Cover with ABD pad Secured With Sock Compression Wrap Compression Stockings Add-Ons Wound #4 (Foot) Wound Laterality: Left, Lateral Cleanser Normal Saline Discharge Instruction: Wash your hands with soap and water. Remove old dressing, discard into plastic bag and place into trash. Cleanse the wound with Normal Saline prior to applying a clean dressing using gauze sponges, not tissues or cotton balls. Do not scrub or use excessive force. Pat dry using gauze sponges, not tissue or cotton balls. Peri-Wound Care Topical Primary Dressing Silvercel Small 2x2 (in/in) Discharge Instruction: Apply Silvercel Small 2x2 (in/in) as instructed Secondary Dressing ABD Pad 5x9 (in/in) Discharge Instruction: Cover with ABD pad Secured With Sock Compression Wrap Compression Stockings Add-Ons Wound #5 (Flank) Wound Laterality: Left, Distal Cleanser Normal Saline [name], Yitzchok A. ([id number]) Discharge Instruction: Wash your hands with soap and water. Remove old dressing, discard into plastic bag and place into trash. Cleanse the wound with Normal Saline prior to applying a clean dressing using gauze sponges, not tissues or cotton balls. Do not scrub or use excessive force. Pat dry using gauze sponges, not tissue or cotton balls. Peri-Wound Care Topical Primary Dressing Silvercel Small 2x2 (in/in) Discharge Instruction: Apply Silvercel Small 2x2 (in/in) as instructed Secondary Dressing ABD Pad 5x9 (in/in) Discharge Instruction: Cover  with ABD pad Secured With 74M Medipore H Soft Cloth Surgical Tape, 2x2 (in/yd) Discharge Instruction: Secure ABD Compression Wrap Compression Stockings Add-Ons Electronic Signature(s) Signed: 02/02/2021 4:10:05 PM By: Baltazar Najjarobson, Michael MD Entered By: Baltazar Najjarobson, Michael on 02/02/2021 12:56:09 Verna CzechWALKER, Jayshaun A. ([id number]) -------------------------------------------------------------------------------- Multi-Disciplinary Care Plan Details Patient Name: [name], Jade A. Date of Service: 02/02/2021 11:15 AM Medical Record Number: [id number] Patient Account Number: [id number] Date of Birth/Sex: 03/12/1991 (29 y.o. M) Treating RN: Huel CoventryWoody, Kim Primary Care Arnie Clingenpeel: Lilyan PuntLuking, Scott Other Clinician: Referring Kolina Kube: Lilyan PuntLuking, Scott Treating Luretta Everly/Extender: Altamese CarolinaOBSON, MICHAEL G Weeks in Treatment: 10 Active Inactive Peripheral Neuropathy Nursing Diagnoses: Knowledge deficit related to disease process and management of peripheral neurovascular dysfunction Goals: Patient/caregiver will verbalize understanding of disease process and disease management Date Initiated: 11/24/2020 Target Resolution Date: 12/01/2020 Goal Status: Active Interventions: Assess signs and symptoms of neuropathy upon admission and as needed Notes: Wound/Skin Impairment Nursing  Diagnoses: Impaired tissue integrity Goals: Patient/caregiver will verbalize understanding of skin care regimen Date Initiated: 11/24/2020 Target Resolution Date: 11/24/2020 Goal Status: Active Interventions: Assess patient/caregiver ability to obtain necessary supplies Assess ulceration(s) every visit Treatment Activities: Skin care regimen initiated : 11/24/2020 Topical wound management initiated : 11/24/2020 Notes: Electronic Signature(s) Signed: 02/02/2021 5:10:04 PM By: Elliot Gurney, BSN, RN, CWS, Kim RN, BSN Entered By: Elliot Gurney, BSN, RN, CWS, Kim on 02/02/2021 12:21:33 [name]  ([id number]) -------------------------------------------------------------------------------- Pain Assessment Details Patient Name: [name] Date of Service: 02/02/2021 11:15 AM Medical Record Number: [id number] Patient Account Number: [id number] Date of Birth/Sex: 04-24-1991 (29 y.o. M) Treating RN: Hansel Feinstein Primary Care Delayni Streed: Lilyan Punt Other Clinician: Referring Maple Odaniel: Lilyan Punt Treating Janos Shampine/Extender: Altamese [place] in Treatment: 10 Active Problems Location of Pain Severity and Description of Pain Patient Has Paino No Site Locations Rate the pain. Current Pain Level: 0 Pain Management and Medication Current Pain Management: Electronic Signature(s) Signed: 02/07/2021 11:12:12 AM By: Hansel Feinstein Entered By: Hansel Feinstein on 02/02/2021 11:41:29 [name] ([id number]) -------------------------------------------------------------------------------- Patient/Caregiver Education Details Patient Name: [name] Date of Service: 02/02/2021 11:15 AM Medical Record Number: [id number] Patient Account Number: [id number] Date of Birth/Gender: 06-10-1991 (29 y.o. M) Treating RN: Huel Coventry Primary Care Physician: Lilyan Punt Other Clinician: Referring Physician: Lilyan Punt Treating Physician/Extender: Altamese [place] in Treatment: 10 Education Assessment Education Provided To: Patient Education Topics Provided Wound Debridement: Handouts: Wound Debridement Methods: Demonstration, Explain/Verbal Responses: State content correctly Wound/Skin Impairment: Handouts: Caring for Your Ulcer Methods: Demonstration, Explain/Verbal Responses: State content correctly Electronic Signature(s) Signed: 02/02/2021 5:10:04 PM By: Elliot Gurney, BSN, RN, CWS, Kim RN, BSN Entered By: Elliot Gurney, BSN, RN, CWS, Kim on 02/02/2021 12:28:54 [name] ([id number]) -------------------------------------------------------------------------------- Wound  Assessment Details Patient Name: [name] Date of Service: 02/02/2021 11:15 AM Medical Record Number: [id number] Patient Account Number: [id number] Date of Birth/Sex: 1990/09/28 (29 y.o. M) Treating RN: Hansel Feinstein Primary Care Semiah Konczal: Lilyan Punt Other Clinician: Referring Defne Gerling: Lilyan Punt Treating Lawson Isabell/Extender: Altamese [place] in Treatment: 10 Wound Status Wound Number: 1 Primary Etiology: Trauma, Other Wound Location: Left, Medial Foot Wound Status: Open Wounding Event: Surgical Injury Comorbid History: Neuropathy Date Acquired: 06/04/2020 Weeks Of Treatment: 10 Clustered Wound: No Photos Wound Measurements Length: (cm) 0.6 Width: (cm) 0.3 Depth: (cm) 0.5 Area: (cm) 0.141 Volume: (cm) 0.071 % Reduction in Area: 93.4% % Reduction in Volume: 91.7% Tunneling: No Undermining: No Wound Description Classification: Full Thickness Without Exposed Support Structures Exudate Amount: Medium Exudate Type: Serosanguineous Exudate Color: red, brown Foul Odor After Cleansing: No Slough/Fibrino Yes Wound Bed Granulation Amount: Small (1-33%) Exposed Structure Granulation Quality: Pink Fascia Exposed: No Necrotic Amount: Large (67-100%) Fat Layer (Subcutaneous Tissue) Exposed: Yes Necrotic Quality: Adherent Slough Tendon Exposed: No Muscle Exposed: No Joint Exposed: No Bone Exposed: No Treatment Notes Wound #1 (Foot) Wound Laterality: Left, Medial Cleanser Normal Saline Discharge Instruction: Wash your hands with soap and water. Remove old dressing, discard into plastic bag and place into trash. Cleanse the wound with Normal Saline prior to applying a clean dressing using gauze sponges, not tissues or cotton balls. Do not scrub or use excessive force. Pat dry using gauze sponges, not tissue or cotton balls. BALIAN, SCHALLER ([id number]) Peri-Wound Care Topical Primary Dressing Endoform 2x2 (in/in) Discharge Instruction: Moisten with  hydrogel, apply as directed Secondary Dressing ABD Pad 5x9 (in/in) Discharge Instruction: Cover with ABD pad Secured With Sock Compression Wrap Compression Stockings Add-Ons Electronic Signature(s) Signed:  02/07/2021 11:12:12 AM By: Hansel Feinstein Entered By: Hansel Feinstein on 02/02/2021 11:45:44 [name] ([id number]) -------------------------------------------------------------------------------- Wound Assessment Details Patient Name: [name] Date of Service: 02/02/2021 11:15 AM Medical Record Number: [id number] Patient Account Number: [id number] Date of Birth/Sex: [birth year] (29 y.o. M) Treating RN: Hansel Feinstein Primary Care Evelyna Folker: Lilyan Punt Other Clinician: Referring Marvelous Woolford: Lilyan Punt Treating Deepti Gunawan/Extender: Altamese [place] in Treatment: 10 Wound Status Wound Number: 4 Primary Etiology: Trauma, Other Wound Location: Left, Lateral Foot Wound Status: Open Wounding Event: Surgical Injury Comorbid History: Neuropathy Date Acquired: 01/14/2021 Weeks Of Treatment: 2 Clustered Wound: No Photos Wound Measurements Length: (cm) 0.3 Width: (cm) 0.2 Depth: (cm) 0.1 Area: (cm) 0.047 Volume: (cm) 0.005 % Reduction in Area: 88% % Reduction in Volume: 87.2% Epithelialization: Small (1-33%) Tunneling: No Undermining: No Wound Description Classification: Full Thickness Without Exposed Support Structures Exudate Amount: Medium Exudate Type: Serosanguineous Exudate Color: red, brown Foul Odor After Cleansing: No Slough/Fibrino Yes Wound Bed Granulation Amount: None Present (0%) Exposed Structure Necrotic Amount: Large (67-100%) Fascia Exposed: No Necrotic Quality: Eschar Fat Layer (Subcutaneous Tissue) Exposed: Yes Tendon Exposed: No Muscle Exposed: No Joint Exposed: No Bone Exposed: No Treatment Notes Wound #4 (Foot) Wound Laterality: Left, Lateral Cleanser Normal Saline Discharge Instruction: Wash your hands with soap and water.  Remove old dressing, discard into plastic bag and place into trash. Cleanse the wound with Normal Saline prior to applying a clean dressing using gauze sponges, not tissues or cotton balls. Do not scrub or use excessive force. Pat dry using gauze sponges, not tissue or cotton balls. DENSIL, OTTEY ([id number]) Peri-Wound Care Topical Primary Dressing Silvercel Small 2x2 (in/in) Discharge Instruction: Apply Silvercel Small 2x2 (in/in) as instructed Secondary Dressing ABD Pad 5x9 (in/in) Discharge Instruction: Cover with ABD pad Secured With Sock Compression Wrap Compression Stockings Add-Ons Electronic Signature(s) Signed: 02/07/2021 11:12:12 AM By: Hansel Feinstein Entered By: Hansel Feinstein on 02/02/2021 11:46:45 [name] ([id number]) -------------------------------------------------------------------------------- Wound Assessment Details Patient Name: [name] Date of Service: 02/02/2021 11:15 AM Medical Record Number: [id number] Patient Account Number: [id number] Date of Birth/Sex: 12/13/1990 (29 y.o. M) Treating RN: Hansel Feinstein Primary Care Lequisha Cammack: Lilyan Punt Other Clinician: Referring Donnelle Rubey: Lilyan Punt Treating Kasee Hantz/Extender: Altamese [place] in Treatment: 10 Wound Status Wound Number: 5 Primary Etiology: Open Surgical Wound Wound Location: Left, Distal Flank Wound Status: Open Wounding Event: Surgical Injury Comorbid History: Neuropathy Date Acquired: 12/31/2020 Weeks Of Treatment: 1 Clustered Wound: No Photos Wound Measurements Length: (cm) 0.6 Width: (cm) 6 Depth: (cm) 0.1 Area: (cm) 2.827 Volume: (cm) 0.283 % Reduction in Area: 40% % Reduction in Volume: 39.9% Epithelialization: Small (1-33%) Tunneling: No Undermining: No Wound Description Classification: Full Thickness Without Exposed Support Structures Exudate Amount: Medium Exudate Type: Serosanguineous Exudate Color: red, brown Foul Odor After Cleansing:  No Slough/Fibrino Yes Wound Bed Granulation Amount: Large (67-100%) Exposed Structure Granulation Quality: Red, Pink Fascia Exposed: No Necrotic Amount: Small (1-33%) Fat Layer (Subcutaneous Tissue) Exposed: Yes Necrotic Quality: Adherent Slough Tendon Exposed: No Muscle Exposed: No Joint Exposed: No Bone Exposed: No Treatment Notes Wound #5 (Flank) Wound Laterality: Left, Distal Cleanser Normal Saline Discharge Instruction: Wash your hands with soap and water. Remove old dressing, discard into plastic bag and place into trash. Cleanse the wound with Normal Saline prior to applying a clean dressing using gauze sponges, not tissues or cotton balls. Do not scrub or use excessive force. Pat dry using gauze sponges, not tissue or cotton balls. WARNER, LADUCA A. ([id number])  Peri-Wound Care Topical Primary Dressing Silvercel Small 2x2 (in/in) Discharge Instruction: Apply Silvercel Small 2x2 (in/in) as instructed Secondary Dressing ABD Pad 5x9 (in/in) Discharge Instruction: Cover with ABD pad Secured With 36M Medipore H Soft Cloth Surgical Tape, 2x2 (in/yd) Discharge Instruction: Secure ABD Compression Wrap Compression Stockings Add-Ons Electronic Signature(s) Signed: 02/07/2021 11:12:12 AM By: Hansel Feinstein Entered By: Hansel Feinstein on 02/02/2021 11:47:35 [name] ([id number]) -------------------------------------------------------------------------------- Vitals Details Patient Name: [name] Date of Service: 02/02/2021 11:15 AM Medical Record Number: [id number] Patient Account Number: [id number] Date of Birth/Sex: [birth year] (29 y.o. M) Treating RN: Hansel Feinstein Primary Care Chamberlain Steinborn: Lilyan Punt Other Clinician: Referring Raven Harmes: Lilyan Punt Treating Euell Schiff/Extender: Altamese [place] in Treatment: 10 Vital Signs Time Taken: 11:35 Temperature (F): 98 Height (in): 72 Pulse (bpm): 68 Weight (lbs): 185 Respiratory Rate (breaths/min): 16 Body  Mass Index (BMI): 25.1 Blood Pressure (mmHg): 121/73 Reference Range: 80 - 120 mg / dl Electronic Signature(s) Signed: 02/07/2021 11:12:12 AM By: Hansel Feinstein Entered ByHansel Feinstein on 02/02/2021 11:40:58

## 2021-02-08 ENCOUNTER — Ambulatory Visit: Payer: 59 | Admitting: Family Medicine

## 2021-02-08 ENCOUNTER — Other Ambulatory Visit (HOSPITAL_COMMUNITY): Payer: Self-pay

## 2021-02-08 VITALS — BP 114/79 | HR 87 | Temp 98.8°F | Ht 71.0 in | Wt 211.0 lb

## 2021-02-08 DIAGNOSIS — I2699 Other pulmonary embolism without acute cor pulmonale: Secondary | ICD-10-CM | POA: Diagnosis not present

## 2021-02-08 DIAGNOSIS — I824Y2 Acute embolism and thrombosis of unspecified deep veins of left proximal lower extremity: Secondary | ICD-10-CM

## 2021-02-08 MED ORDER — APIXABAN 5 MG PO TABS
5.0000 mg | ORAL_TABLET | Freq: Two times a day (BID) | ORAL | 4 refills | Status: DC
  Filled 2021-02-08 – 2021-03-10 (×2): qty 60, 30d supply, fill #0
  Filled 2021-04-10: qty 60, 30d supply, fill #1
  Filled 2021-05-12: qty 60, 30d supply, fill #2
  Filled 2021-06-16: qty 60, 30d supply, fill #3
  Filled 2021-08-12: qty 60, 30d supply, fill #4

## 2021-02-08 MED FILL — Apixaban Tab 5 MG: ORAL | 30 days supply | Qty: 60 | Fill #2 | Status: AC

## 2021-02-08 NOTE — Progress Notes (Signed)
   Subjective:    Patient ID: [name], male    DOB: [birth year], 30 y.o.   MRN: [id number]  HPI Pt doing a follow up.   Pt states he did not know he was not suppose to go in sun while taking eliquis.  Patient had photosensitivity reaction  Patient states overall he seems to be doing fairly well he did is debilitated by his left foot that has a walking shoe on it.  He uses crutches to get around.  Finds himself feeling unstable.  Doing physical therapy.  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  At that point in time we will do ultrasound lab work and have consultation with hematology  More than likely will be walking shoe through September patient will follow-up in September  Patient denies being depressed.

## 2021-02-09 ENCOUNTER — Encounter: Payer: 59 | Admitting: Internal Medicine

## 2021-02-09 ENCOUNTER — Other Ambulatory Visit: Payer: Self-pay

## 2021-02-09 ENCOUNTER — Encounter (HOSPITAL_COMMUNITY): Payer: Self-pay | Admitting: Physical Therapy

## 2021-02-09 ENCOUNTER — Ambulatory Visit (HOSPITAL_COMMUNITY): Payer: 59 | Admitting: Physical Therapy

## 2021-02-09 DIAGNOSIS — L97522 Non-pressure chronic ulcer of other part of left foot with fat layer exposed: Secondary | ICD-10-CM | POA: Diagnosis not present

## 2021-02-09 DIAGNOSIS — T8131XA Disruption of external operation (surgical) wound, not elsewhere classified, initial encounter: Secondary | ICD-10-CM | POA: Diagnosis not present

## 2021-02-09 DIAGNOSIS — R262 Difficulty in walking, not elsewhere classified: Secondary | ICD-10-CM | POA: Diagnosis not present

## 2021-02-09 DIAGNOSIS — M6281 Muscle weakness (generalized): Secondary | ICD-10-CM | POA: Diagnosis not present

## 2021-02-09 DIAGNOSIS — T8133XA Disruption of traumatic injury wound repair, initial encounter: Secondary | ICD-10-CM | POA: Diagnosis not present

## 2021-02-09 NOTE — Progress Notes (Signed)
MAC, DOWDELL ([id number]) Visit Report for 02/09/2021 Arrival Information Details Patient Name: [name], [name]. Date of Service: 02/09/2021 11:15 AM Medical Record Number: [id number] Patient Account Number: [id number] Date of Birth/Sex: Apr 29, 1991 (29 y.o. M) Treating RN: Hansel Feinstein Primary Care Asaph Serena: Lilyan Punt Other Clinician: Referring Coraleigh Sheeran: Lilyan Punt Treating Shaela Boer/Extender: Altamese [place] in Treatment: 11 Visit Information History Since Last Visit Added or deleted any medications: No Patient Arrived: Crutches Had a fall or experienced change in No Arrival Time: 11:17 activities of daily living that may affect Accompanied By: mother risk of falls: Transfer Assistance: None Hospitalized since last visit: No Patient Identification Verified: Yes Has Dressing in Place as Prescribed: Yes Secondary Verification Process Completed: Yes Pain Present Now: Yes Patient Has Alerts: Yes Patient Alerts: Patient on Blood Thinner Eliquis NOT DIABETIC Electronic Signature(s) Signed: 02/09/2021 4:05:24 PM By: Hansel Feinstein Entered By: Hansel Feinstein on 02/09/2021 11:19:52 [name] ([id number]) -------------------------------------------------------------------------------- Clinic Level of Care Assessment Details Patient Name: Brian Hams A. Date of Service: 02/09/2021 11:15 AM Medical Record Number: [id number] Patient Account Number: [id number] Date of Birth/Sex: Dec 27, 1990 (29 y.o. M) Treating RN: Rogers Blocker Primary Care Elisabetta Mishra: Lilyan Punt Other Clinician: Referring Corryn Madewell: Lilyan Punt Treating Vincent Streater/Extender: Altamese [place] in Treatment: 11 Clinic Level of Care Assessment Items TOOL 4 Quantity Score X - Use when only an EandM is performed on FOLLOW-UP visit 1 0 ASSESSMENTS - Nursing Assessment / Reassessment X - Reassessment of Co-morbidities (includes updates in patient status) 1 10 X- 1 5 Reassessment of Adherence to  Treatment Plan ASSESSMENTS - Wound and Skin Assessment / Reassessment []  - Simple Wound Assessment / Reassessment - one wound 0 X- 2 5 Complex Wound Assessment / Reassessment - multiple wounds []  - 0 Dermatologic / Skin Assessment (not related to wound area) ASSESSMENTS - Focused Assessment []  - Circumferential Edema Measurements - multi extremities 0 []  - 0 Nutritional Assessment / Counseling / Intervention []  - 0 Lower Extremity Assessment (monofilament, tuning fork, pulses) []  - 0 Peripheral Arterial Disease Assessment (using hand held doppler) ASSESSMENTS - Ostomy and/or Continence Assessment and Care []  - Incontinence Assessment and Management 0 []  - 0 Ostomy Care Assessment and Management (repouching, etc.) PROCESS - Coordination of Care X - Simple Patient / Family Education for ongoing care 1 15 []  - 0 Complex (extensive) Patient / Family Education for ongoing care []  - 0 Staff obtains , Records, Test Results / Process Orders []  - 0 Staff telephones HHA, Nursing Homes / Clarify orders / etc []  - 0 Routine Transfer to another Facility (non-emergent condition) []  - 0 Routine Hospital Admission (non-emergent condition) []  - 0 New Admissions / / Ordering NPWT, Apligraf, etc. []  - 0 Emergency Hospital Admission (emergent condition) X- 1 10 Simple Discharge Coordination []  - 0 Complex (extensive) Discharge Coordination PROCESS - Special Needs []  - Pediatric / Minor Patient Management 0 []  - 0 Isolation Patient Management []  - 0 Hearing / Language / Visual special needs []  - 0 Assessment of Community assistance (transportation, D/C planning, etc.) []  - 0 Additional assistance / Altered mentation []  - 0 Support Surface(s) Assessment (bed, cushion, seat, etc.) INTERVENTIONS - Wound Cleansing / Measurement Lee, [name] ( ) []  - 0 Simple Wound Cleansing - one wound X- 2 5 Complex Wound Cleansing - multiple wounds X- 1  5 Wound Imaging (photographs - any number of wounds) []  - 0 Wound Tracing (instead of photographs) []  - 0 Simple Wound Measurement - one wound  X- 2 5 Complex Wound Measurement - multiple wounds INTERVENTIONS - Wound Dressings []  - Small Wound Dressing one or multiple wounds 0 X- 1 15 Medium Wound Dressing one or multiple wounds []  - 0 Large Wound Dressing one or multiple wounds []  - 0 Application of Medications - topical []  - 0 Application of Medications - injection INTERVENTIONS - Miscellaneous []  - External ear exam 0 []  - 0 Specimen Collection (cultures, biopsies, blood, body fluids, etc.) []  - 0 Specimen(s) / Culture(s) sent or taken to Lab for analysis []  - 0 Patient Transfer (multiple staff / Lift / Similar devices) []  - 0 Simple Staple / Suture removal (25 or less) []  - 0 Complex Staple / Suture removal (26 or more) []  - 0 Hypo / Hyperglycemic Management (close monitor of Blood Glucose) []  - 0 Ankle / Brachial Index (ABI) - do not check if billed separately X- 1 5 Vital Signs Has the patient been seen at the hospital within the last three years: Yes Total Score: 95 Level Of Care: New/Established - Level 3 Electronic Signature(s) Signed: 02/09/2021 4:33:50 PM By: , RN Entered By: , Kenia on 02/09/2021 11:46:50 ( ) -------------------------------------------------------------------------------- Encounter Discharge Information Details Patient Name: Brian Sites A. Date of Service: 02/09/2021 11:15 AM Medical Record Number: Patient Account Number: Date of Birth/Sex: May 12, 1991 (29 y.o. M) Treating RN: Phillis Haggis Primary Care Mry Lamia: Dondra Prader Other Clinician: Referring Melana Hingle: Phillis Haggis Treating Allahna Husband/Extender: 02/11/2021 in Treatment: 11 Encounter Discharge Information Items Discharge Condition: Stable Ambulatory Status:  Ambulatory Discharge Destination: Home Transportation: Private Auto Accompanied By: self Schedule Follow-up Appointment: Yes Clinical Summary of Care: Patient Declined Electronic Signature(s) Signed: 02/09/2021 4:27:57 PM By: [id number] RN Entered By: Brian Hams on 02/09/2021 12:08:53 [id number] ([id number]) -------------------------------------------------------------------------------- Lower Extremity Assessment Details Patient Name: 06/25/1991 A. Date of Service: 02/09/2021 11:15 AM Medical Record Number: Yevonne Pax Patient Account Number: Lilyan Punt Date of Birth/Sex: Dec 30, 1990 (29 y.o. M) Treating RN: 02/11/2021 Primary Care Elleanor Guyett: Yevonne Pax Other Clinician: Referring Fatema Rabe: Yevonne Pax Treating Deloyd Handy/Extender: 02/11/2021 Weeks in Treatment: 11 Edema Assessment Assessed: [Left: Yes] [Right: No] Edema: [Left: N] [Right: o] Electronic Signature(s) Signed: 02/09/2021 4:05:24 PM By: [id number] Entered By: Brian Hams on 02/09/2021 11:31:56 [id number] ([id number]) -------------------------------------------------------------------------------- Multi Wound Chart Details Patient Name: 06/25/1991 A. Date of Service: 02/09/2021 11:15 AM Medical Record Number: Hansel Feinstein Patient Account Number: Lilyan Punt Date of Birth/Sex: 09-12-1990 (29 y.o. M) Treating RN: 02/11/2021 Primary Care Zigmond Trela: Hansel Feinstein Other Clinician: Referring Guy Seese: Hansel Feinstein Treating Overton Boggus/Extender: 02/11/2021 in Treatment: 11 Vital Signs Height(in): 72 Pulse(bpm): 69 Weight(lbs): 185 Blood Pressure(mmHg): 110/72 Body Mass Index(BMI): 25 Temperature(F): 97.9 Respiratory Rate(breaths/min): 16 Photos: Wound Location: Left, Medial Foot Left, Lateral Foot Left, Distal Flank Wounding Event: Surgical Injury Surgical Injury Surgical Injury Primary Etiology: Trauma, Other Trauma, Other Open Surgical Wound Comorbid History: Neuropathy  Neuropathy Neuropathy Date Acquired: 06/04/2020 01/14/2021 12/31/2020 Weeks of Treatment: 11 3 2  Wound Status: Open Healed - Epithelialized Open Measurements L x W x D (cm) 0.5x0.3x0.5 0x0x0 0.5x6x0.1 Area (cm) : 0.118 0 2.356 Volume (cm) : 0.059 0 0.236 % Reduction in Area: 94.50% 100.00% 50.00% % Reduction in Volume: 93.10% 100.00% 49.90% Classification: Full Thickness Without Exposed Full Thickness Without Exposed Full Thickness Without Exposed Support Structures Support Structures Support Structures Exudate Amount: Medium None Present Medium Exudate Type: Serosanguineous N/A Serosanguineous Exudate Color: red, brown N/A red, brown  Granulation Amount: Small (1-33%) None Present (0%) Medium (34-66%) Granulation Quality: Pink N/A Red, Pink Necrotic Amount: Large (67-100%) None Present (0%) Medium (34-66%) Necrotic Tissue: Adherent Slough N/A Eschar, Adherent Slough Exposed Structures: Fat Layer (Subcutaneous Tissue): Fascia: No Fat Layer (Subcutaneous Tissue): Yes Fat Layer (Subcutaneous Tissue): Yes Fascia: No No Fascia: No Tendon: No Tendon: No Tendon: No Muscle: No Muscle: No Muscle: No Joint: No Joint: No Joint: No Bone: No Bone: No Bone: No Epithelialization: N/A Large (67-100%) Small (1-33%) Treatment Notes Wound #1 (Foot) Wound Laterality: Left, Medial Cleanser Normal Saline Discharge Instruction: Wash your hands with soap and water. Remove old dressing, discard into plastic bag and place into trash. Cleanse the wound with Normal Saline prior to applying a clean dressing using gauze sponges, not tissues or cotton balls. Do not scrub or use excessive force. Pat dry using gauze sponges, not tissue or cotton balls. Peri-Wound Care ALQUAN, MORRISH ([id number]) Topical Primary Dressing Endoform 2x2 (in/in) Discharge Instruction: Moisten with hydrogel, apply as directed Secondary Dressing ABD Pad 5x9 (in/in) Discharge Instruction: Cover with ABD pad Secured  With Sock Compression Wrap Compression Stockings Add-Ons Wound #5 (Flank) Wound Laterality: Left, Distal Cleanser Normal Saline Discharge Instruction: Wash your hands with soap and water. Remove old dressing, discard into plastic bag and place into trash. Cleanse the wound with Normal Saline prior to applying a clean dressing using gauze sponges, not tissues or cotton balls. Do not scrub or use excessive force. Pat dry using gauze sponges, not tissue or cotton balls. Peri-Wound Care Topical Primary Dressing Silvercel Small 2x2 (in/in) Discharge Instruction: Apply Silvercel Small 2x2 (in/in) as instructed Secondary Dressing ABD Pad 5x9 (in/in) Discharge Instruction: Cover with ABD pad Secured With 56M Medipore H Soft Cloth Surgical Tape, 2x2 (in/yd) Discharge Instruction: Secure ABD Compression Wrap Compression Stockings Add-Ons Electronic Signature(s) Signed: 02/09/2021 4:26:18 PM By: Baltazar Najjar MD Entered By: Baltazar Najjar on 02/09/2021 12:13:38 [name] ([id number]) -------------------------------------------------------------------------------- Multi-Disciplinary Care Plan Details Patient Name: Brian Hams A. Date of Service: 02/09/2021 11:15 AM Medical Record Number: [id number] Patient Account Number: [id number] Date of Birth/Sex: June 25, 1991 (29 y.o. M) Treating RN: Rogers Blocker Primary Care Tyanne Derocher: Lilyan Punt Other Clinician: Referring Rea Reser: Lilyan Punt Treating Ilissa Rosner/Extender: Altamese [place] in Treatment: 11 Active Inactive Peripheral Neuropathy Nursing Diagnoses: Knowledge deficit related to disease process and management of peripheral neurovascular dysfunction Goals: Patient/caregiver will verbalize understanding of disease process and disease management Date Initiated: 11/24/2020 Target Resolution Date: 12/01/2020 Goal Status: Active Interventions: Assess signs and symptoms of neuropathy upon admission and as  needed Notes: Wound/Skin Impairment Nursing Diagnoses: Impaired tissue integrity Goals: Patient/caregiver will verbalize understanding of skin care regimen Date Initiated: 11/24/2020 Target Resolution Date: 11/24/2020 Goal Status: Active Interventions: Assess patient/caregiver ability to obtain necessary supplies Assess ulceration(s) every visit Treatment Activities: Skin care regimen initiated : 11/24/2020 Topical wound management initiated : 11/24/2020 Notes: Electronic Signature(s) Signed: 02/09/2021 4:33:50 PM By: Phillis Haggis, Dondra Prader RN Entered By: Phillis Haggis, Dondra Prader on 02/09/2021 11:45:28 [name] ([id number]) -------------------------------------------------------------------------------- Pain Assessment Details Patient Name: Brian Hams A. Date of Service: 02/09/2021 11:15 AM Medical Record Number: [id number] Patient Account Number: [id number] Date of Birth/Sex: December 09, 1990 (29 y.o. M) Treating RN: Hansel Feinstein Primary Care Adonis Ryther: Lilyan Punt Other Clinician: Referring Galena Logie: Lilyan Punt Treating Sharad Vaneaton/Extender: Altamese [place] in Treatment: 11 Active Problems Location of Pain Severity and Description of Pain Patient Has Paino Yes Site Locations Pain Location: Pain in Ulcers Rate the pain. Current Pain Level: 1 Pain  Management and Medication Current Pain Management: Electronic Signature(s) Signed: 02/09/2021 4:05:24 PM By: Hansel Feinstein Entered By: Hansel Feinstein on 02/09/2021 11:20:08 [name] ([id number]) -------------------------------------------------------------------------------- Patient/Caregiver Education Details Patient Name: Brian Hams A. Date of Service: 02/09/2021 11:15 AM Medical Record Number: [id number] Patient Account Number: [id number] Date of Birth/Gender: 1991-05-01 (29 y.o. M) Treating RN: Rogers Blocker Primary Care Physician: Lilyan Punt Other Clinician: Referring Physician: Lilyan Punt Treating  Physician/Extender: Altamese [place] in Treatment: 11 Education Assessment Education Provided To: Patient Education Topics Provided Wound/Skin Impairment: Methods: Explain/Verbal Responses: State content correctly Electronic Signature(s) Signed: 02/09/2021 4:33:50 PM By: Phillis Haggis, Dondra Prader RN Entered By: Phillis Haggis, Dondra Prader on 02/09/2021 11:47:06 [name] ([id number]) -------------------------------------------------------------------------------- Wound Assessment Details Patient Name: Brian Hams A. Date of Service: 02/09/2021 11:15 AM Medical Record Number: [id number] Patient Account Number: [id number] Date of Birth/Sex: 08-18-1991 (29 y.o. M) Treating RN: Hansel Feinstein Primary Care Karsten Vaughn: Lilyan Punt Other Clinician: Referring Grizelda Piscopo: Lilyan Punt Treating Kenston Longton/Extender: Altamese [place] in Treatment: 11 Wound Status Wound Number: 1 Primary Etiology: Trauma, Other Wound Location: Left, Medial Foot Wound Status: Open Wounding Event: Surgical Injury Comorbid History: Neuropathy Date Acquired: 06/04/2020 Weeks Of Treatment: 11 Clustered Wound: No Photos Wound Measurements Length: (cm) 0.5 Width: (cm) 0.3 Depth: (cm) 0.5 Area: (cm) 0.118 Volume: (cm) 0.059 % Reduction in Area: 94.5% % Reduction in Volume: 93.1% Tunneling: No Undermining: No Wound Description Classification: Full Thickness Without Exposed Support Structures Exudate Amount: Medium Exudate Type: Serosanguineous Exudate Color: red, brown Foul Odor After Cleansing: No Slough/Fibrino Yes Wound Bed Granulation Amount: Small (1-33%) Exposed Structure Granulation Quality: Pink Fascia Exposed: No Necrotic Amount: Large (67-100%) Fat Layer (Subcutaneous Tissue) Exposed: Yes Necrotic Quality: Adherent Slough Tendon Exposed: No Muscle Exposed: No Joint Exposed: No Bone Exposed: No Treatment Notes Wound #1 (Foot) Wound Laterality: Left, Medial Cleanser Normal  Saline Discharge Instruction: Wash your hands with soap and water. Remove old dressing, discard into plastic bag and place into trash. Cleanse the wound with Normal Saline prior to applying a clean dressing using gauze sponges, not tissues or cotton balls. Do not scrub or use excessive force. Pat dry using gauze sponges, not tissue or cotton balls. [name], [name] ([id number]) Peri-Wound Care Topical Primary Dressing Endoform 2x2 (in/in) Discharge Instruction: Moisten with hydrogel, apply as directed Secondary Dressing ABD Pad 5x9 (in/in) Discharge Instruction: Cover with ABD pad Secured With Sock Compression Wrap Compression Stockings Add-Ons Electronic Signature(s) Signed: 02/09/2021 4:05:24 PM By: Hansel Feinstein Entered By: Hansel Feinstein on 02/09/2021 11:29:16 [name] ([id number]) -------------------------------------------------------------------------------- Wound Assessment Details Patient Name: Brian Hams A. Date of Service: 02/09/2021 11:15 AM Medical Record Number: [id number] Patient Account Number: [id number] Date of Birth/Sex: 01/17/1991 (29 y.o. M) Treating RN: Hansel Feinstein Primary Care Trygg Mantz: Lilyan Punt Other Clinician: Referring Othello Sgroi: Lilyan Punt Treating Shresta Risden/Extender: Altamese  in Treatment: 11 Wound Status Wound Number: 4 Primary Etiology: Trauma, Other Wound Location: Left, Lateral Foot Wound Status: Healed - Epithelialized Wounding Event: Surgical Injury Comorbid History: Neuropathy Date Acquired: 01/14/2021 Weeks Of Treatment: 3 Clustered Wound: No Photos Wound Measurements Length: (cm) Width: (cm) Depth: (cm) Area: (cm) Volume: (cm) 0 % Reduction in Area: 100% 0 % Reduction in Volume: 100% 0 Epithelialization: Large (67-100%) 0 Tunneling: No 0 Undermining: No Wound Description Classification: Full Thickness Without Exposed Support Structure Exudate Amount: None Present s Foul Odor After Cleansing:  No Slough/Fibrino No Wound Bed Granulation Amount: None Present (0%) Exposed Structure Necrotic Amount: None Present (0%) Fascia Exposed:  No Fat Layer (Subcutaneous Tissue) Exposed: No Tendon Exposed: No Muscle Exposed: No Joint Exposed: No Bone Exposed: No Electronic Signature(s) Signed: 02/09/2021 4:05:24 PM By: Hansel FeinsteinBishop, Joy Signed: 02/09/2021 4:33:50 PM By: Phillis HaggisSanchez Pereyda, Dondra PraderKenia RN Entered By: Phillis HaggisSanchez Pereyda, Kenia on 02/09/2021 11:45:13 Verna CzechWALKER, [name] ([id number]) -------------------------------------------------------------------------------- Wound Assessment Details Patient Name: Brian HamsWALKER, [name] Date of Service: 02/09/2021 11:15 AM Medical Record Number: [id number] Patient Account Number: [id number] Date of Birth/Sex: 06/20/1991 (29 y.o. M) Treating RN: Hansel FeinsteinBishop, Joy Primary Care Brenin Heidelberger: Lilyan PuntLuking, Scott Other Clinician: Referring Elvyn Krohn: Lilyan PuntLuking, Scott Treating Chizara Mena/Extender: Altamese CarolinaOBSON, MICHAEL G Weeks in Treatment: 11 Wound Status Wound Number: 5 Primary Etiology: Open Surgical Wound Wound Location: Left, Distal Flank Wound Status: Open Wounding Event: Surgical Injury Comorbid History: Neuropathy Date Acquired: 12/31/2020 Weeks Of Treatment: 2 Clustered Wound: No Photos Wound Measurements Length: (cm) 0.5 Width: (cm) 6 Depth: (cm) 0.1 Area: (cm) 2.356 Volume: (cm) 0.236 % Reduction in Area: 50% % Reduction in Volume: 49.9% Epithelialization: Small (1-33%) Tunneling: No Undermining: No Wound Description Classification: Full Thickness Without Exposed Support Structures Exudate Amount: Medium Exudate Type: Serosanguineous Exudate Color: red, brown Foul Odor After Cleansing: No Slough/Fibrino Yes Wound Bed Granulation Amount: Medium (34-66%) Exposed Structure Granulation Quality: Red, Pink Fascia Exposed: No Necrotic Amount: Medium (34-66%) Fat Layer (Subcutaneous Tissue) Exposed: Yes Necrotic Quality: Eschar, Adherent Slough Tendon Exposed:  No Muscle Exposed: No Joint Exposed: No Bone Exposed: No Treatment Notes Wound #5 (Flank) Wound Laterality: Left, Distal Cleanser Normal Saline Discharge Instruction: Wash your hands with soap and water. Remove old dressing, discard into plastic bag and place into trash. Cleanse the wound with Normal Saline prior to applying a clean dressing using gauze sponges, not tissues or cotton balls. Do not scrub or use excessive force. Pat dry using gauze sponges, not tissue or cotton balls. Verna CzechWALKER, [name] ([id number]) Peri-Wound Care Topical Primary Dressing Silvercel Small 2x2 (in/in) Discharge Instruction: Apply Silvercel Small 2x2 (in/in) as instructed Secondary Dressing ABD Pad 5x9 (in/in) Discharge Instruction: Cover with ABD pad Secured With 83M Medipore H Soft Cloth Surgical Tape, 2x2 (in/yd) Discharge Instruction: Secure ABD Compression Wrap Compression Stockings Add-Ons Electronic Signature(s) Signed: 02/09/2021 4:05:24 PM By: Hansel FeinsteinBishop, Joy Entered By: Hansel FeinsteinBishop, Joy on 02/09/2021 11:31:14 Verna CzechWALKER, [name] ([id number]) -------------------------------------------------------------------------------- Vitals Details Patient Name: Brian HamsWALKER, [name] Date of Service: 02/09/2021 11:15 AM Medical Record Number: [id number] Patient Account Number: [id number] Date of Birth/Sex: 03/02/1991 (29 y.o. M) Treating RN: Hansel FeinsteinBishop, Joy Primary Care Xavior Niazi: Lilyan PuntLuking, Scott Other Clinician: Referring Jeanifer Halliday: Lilyan PuntLuking, Scott Treating Mercedes Fort/Extender: Altamese CarolinaOBSON, MICHAEL G Weeks in Treatment: 11 Vital Signs Time Taken: 11:18 Temperature (F): 97.9 Height (in): 72 Pulse (bpm): 69 Weight (lbs): 185 Respiratory Rate (breaths/min): 16 Body Mass Index (BMI): 25.1 Blood Pressure (mmHg): 110/72 Reference Range: 80 - 120 mg / dl Electronic Signature(s) Signed: 02/09/2021 4:05:24 PM By: Hansel FeinsteinBishop, Joy Entered ByHansel Feinstein: Bishop, Joy on 02/09/2021 11:22:00
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
## 2021-02-15 ENCOUNTER — Ambulatory Visit (HOSPITAL_COMMUNITY): Payer: 59 | Admitting: Physical Therapy

## 2021-02-15 ENCOUNTER — Other Ambulatory Visit: Payer: Self-pay

## 2021-02-15 DIAGNOSIS — M6281 Muscle weakness (generalized): Secondary | ICD-10-CM | POA: Diagnosis not present

## 2021-02-15 DIAGNOSIS — R262 Difficulty in walking, not elsewhere classified: Secondary | ICD-10-CM | POA: Diagnosis not present
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
## 2021-02-16 ENCOUNTER — Encounter (HOSPITAL_BASED_OUTPATIENT_CLINIC_OR_DEPARTMENT_OTHER): Payer: 59 | Admitting: Internal Medicine

## 2021-02-16 DIAGNOSIS — L98428 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: Secondary | ICD-10-CM | POA: Diagnosis not present

## 2021-02-16 DIAGNOSIS — T8133XA Disruption of traumatic injury wound repair, initial encounter: Secondary | ICD-10-CM | POA: Diagnosis not present

## 2021-02-16 DIAGNOSIS — L97522 Non-pressure chronic ulcer of other part of left foot with fat layer exposed: Secondary | ICD-10-CM | POA: Diagnosis not present

## 2021-02-16 DIAGNOSIS — L97528 Non-pressure chronic ulcer of other part of left foot with other specified severity: Secondary | ICD-10-CM | POA: Diagnosis not present

## 2021-02-17 ENCOUNTER — Ambulatory Visit (HOSPITAL_COMMUNITY): Payer: 59 | Admitting: Physical Therapy

## 2021-02-17 ENCOUNTER — Telehealth (HOSPITAL_COMMUNITY): Payer: Self-pay | Admitting: Physical Therapy
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
## 2021-02-22 ENCOUNTER — Encounter (HOSPITAL_COMMUNITY): Payer: Self-pay | Admitting: Physical Therapy

## 2021-02-22 ENCOUNTER — Ambulatory Visit (HOSPITAL_COMMUNITY): Payer: 59

## 2021-02-22 ENCOUNTER — Other Ambulatory Visit: Payer: Self-pay

## 2021-02-22 DIAGNOSIS — M25632 Stiffness of left wrist, not elsewhere classified: Secondary | ICD-10-CM | POA: Insufficient documentation

## 2021-02-22 DIAGNOSIS — M7989 Other specified soft tissue disorders: Secondary | ICD-10-CM | POA: Diagnosis not present

## 2021-02-22 DIAGNOSIS — M79672 Pain in left foot: Secondary | ICD-10-CM | POA: Diagnosis not present

## 2021-02-22 DIAGNOSIS — Z86718 Personal history of other venous thrombosis and embolism: Secondary | ICD-10-CM | POA: Diagnosis not present

## 2021-02-22 DIAGNOSIS — Z7289 Other problems related to lifestyle: Secondary | ICD-10-CM | POA: Diagnosis not present

## 2021-02-22 DIAGNOSIS — R262 Difficulty in walking, not elsewhere classified: Secondary | ICD-10-CM

## 2021-02-22 DIAGNOSIS — M6281 Muscle weakness (generalized): Secondary | ICD-10-CM

## 2021-02-22 DIAGNOSIS — I82412 Acute embolism and thrombosis of left femoral vein: Secondary | ICD-10-CM | POA: Diagnosis not present

## 2021-02-22 DIAGNOSIS — M25572 Pain in left ankle and joints of left foot: Secondary | ICD-10-CM | POA: Diagnosis not present

## 2021-02-22 DIAGNOSIS — F17210 Nicotine dependence, cigarettes, uncomplicated: Secondary | ICD-10-CM | POA: Diagnosis not present

## 2021-02-22 DIAGNOSIS — M25622 Stiffness of left elbow, not elsewhere classified: Secondary | ICD-10-CM | POA: Insufficient documentation

## 2021-02-22 DIAGNOSIS — I2699 Other pulmonary embolism without acute cor pulmonale: Secondary | ICD-10-CM | POA: Diagnosis not present

## 2021-02-22 DIAGNOSIS — Z881 Allergy status to other antibiotic agents status: Secondary | ICD-10-CM | POA: Diagnosis not present

## 2021-02-22 DIAGNOSIS — Z79899 Other long term (current) drug therapy: Secondary | ICD-10-CM | POA: Diagnosis not present

## 2021-02-22 DIAGNOSIS — R269 Unspecified abnormalities of gait and mobility: Secondary | ICD-10-CM | POA: Diagnosis not present
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
## 2021-02-23 ENCOUNTER — Encounter: Payer: 59 | Attending: Internal Medicine | Admitting: Internal Medicine

## 2021-02-23 DIAGNOSIS — T8131XD Disruption of external operation (surgical) wound, not elsewhere classified, subsequent encounter: Secondary | ICD-10-CM | POA: Insufficient documentation

## 2021-02-23 DIAGNOSIS — Z89432 Acquired absence of left foot: Secondary | ICD-10-CM | POA: Diagnosis not present

## 2021-02-23 DIAGNOSIS — L98428 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: Secondary | ICD-10-CM | POA: Diagnosis not present

## 2021-02-23 DIAGNOSIS — Z87891 Personal history of nicotine dependence: Secondary | ICD-10-CM | POA: Diagnosis not present

## 2021-02-23 DIAGNOSIS — L97528 Non-pressure chronic ulcer of other part of left foot with other specified severity: Secondary | ICD-10-CM | POA: Diagnosis not present

## 2021-02-23 DIAGNOSIS — L97521 Non-pressure chronic ulcer of other part of left foot limited to breakdown of skin: Secondary | ICD-10-CM | POA: Diagnosis not present

## 2021-02-23 DIAGNOSIS — L97529 Non-pressure chronic ulcer of other part of left foot with unspecified severity: Secondary | ICD-10-CM | POA: Diagnosis present

## 2021-02-23 NOTE — Progress Notes (Signed)
[name], Seng A. ([id number]) Visit Report for 02/23/2021 Arrival Information Details Patient Name: [name], [name] Date of Service: 02/23/2021 2:00 PM Medical Record Number: [id number] Patient Account Number: [id number] Date of Birth/Sex: 11/12/1990 (29 y.o. M) Treating RN: Hansel FeinsteinBishop, Joy Primary Care Eulala Newcombe: Lilyan PuntLuking, Scott Other Clinician: Referring Peyson Postema: Lilyan PuntLuking, Scott Treating Kelcey Korus/Extender: Tilda FrancoHoffman, Jessica Weeks in Treatment: 13 Visit Information History Since Last Visit Added or deleted any medications: No Patient Arrived: Crutches Had a fall or experienced change in No Arrival Time: 13:59 activities of daily living that may affect Accompanied By: mother risk of falls: Transfer Assistance: None Hospitalized since last visit: No Patient Has Alerts: Yes Has Dressing in Place as Prescribed: Yes Patient Alerts: Patient on Blood Thinner Pain Present Now: No Eliquis NOT DIABETIC Electronic Signature(s) Signed: 02/23/2021 4:25:48 PM By: Hansel FeinsteinBishop, Joy Entered By: Hansel FeinsteinBishop, Joy on 02/23/2021 14:04:15 [name], [name] ([id number]) -------------------------------------------------------------------------------- Clinic Level of Care Assessment Details Patient Name: [name], [name] Date of Service: 02/23/2021 2:00 PM Medical Record Number: [id number] Patient Account Number: [id number] Date of Birth/Sex: 04/09/1991 (29 y.o. M) Treating RN: Hansel FeinsteinBishop, Joy Primary Care Alpha Chouinard: Lilyan PuntLuking, Scott Other Clinician: Referring Zaia Carre: Lilyan PuntLuking, Scott Treating Elverta Dimiceli/Extender: Tilda FrancoHoffman, Jessica Weeks in Treatment: 13 Clinic Level of Care Assessment Items TOOL 1 Quantity Score []  - Use when EandM and Procedure is performed on INITIAL visit 0 ASSESSMENTS - Nursing Assessment / Reassessment []  - General Physical Exam (combine w/ comprehensive assessment (listed just below) when performed on new 0 pt. evals) []  - 0 Comprehensive Assessment (HX, ROS, Risk Assessments, Wounds Hx, etc.) ASSESSMENTS -  Wound and Skin Assessment / Reassessment []  - Dermatologic / Skin Assessment (not related to wound area) 0 ASSESSMENTS - Ostomy and/or Continence Assessment and Care []  - Incontinence Assessment and Management 0 []  - 0 Ostomy Care Assessment and Management (repouching, etc.) PROCESS - Coordination of Care []  - Simple Patient / Family Education for ongoing care 0 []  - 0 Complex (extensive) Patient / Family Education for ongoing care []  - 0 Staff obtains ChiropractorConsents, Records, Test Results / Process Orders []  - 0 Staff telephones HHA, Nursing Homes / Clarify orders / etc []  - 0 Routine Transfer to another Facility (non-emergent condition) []  - 0 Routine Hospital Admission (non-emergent condition) []  - 0 New Admissions / Manufacturing engineernsurance Authorizations / Ordering NPWT, Apligraf, etc. []  - 0 Emergency Hospital Admission (emergent condition) PROCESS - Special Needs []  - Pediatric / Minor Patient Management 0 []  - 0 Isolation Patient Management []  - 0 Hearing / Language / Visual special needs []  - 0 Assessment of Community assistance (transportation, D/C planning, etc.) []  - 0 Additional assistance / Altered mentation []  - 0 Support Surface(s) Assessment (bed, cushion, seat, etc.) INTERVENTIONS - Miscellaneous []  - External ear exam 0 []  - 0 Patient Transfer (multiple staff / Nurse, adultHoyer Lift / Similar devices) []  - 0 Simple Staple / Suture removal (25 or less) []  - 0 Complex Staple / Suture removal (26 or more) []  - 0 Hypo/Hyperglycemic Management (do not check if billed separately) []  - 0 Ankle / Brachial Index (ABI) - do not check if billed separately Has the patient been seen at the hospital within the last three years: Yes Total Score: 0 Level Of Care: ____ [name], Davontae A. ([id number]) Electronic Signature(s) Signed: 02/23/2021 4:25:48 PM By: Hansel FeinsteinBishop, Joy Entered By: Hansel FeinsteinBishop, Joy on 02/23/2021 14:58:45 [name], Channin A.  ([id number]) -------------------------------------------------------------------------------- Encounter Discharge Information Details Patient Name: [name], [name] Date of Service: 02/23/2021 2:00 PM Medical Record Number: [id number] Patient Account  Number: [id number] Date of Birth/Sex: 08-31-1990 (29 y.o. M) Treating RN: Hansel Feinstein Primary Care Staci Carver: Lilyan Punt Other Clinician: Referring Azara Gemme: Lilyan Punt Treating Benjamyn Hestand/Extender: Tilda Franco in Treatment: 63 Encounter Discharge Information Items Post Procedure Vitals Discharge Condition: Stable Temperature (F): 98.3 Ambulatory Status: Crutches Pulse (bpm): 71 Discharge Destination: Home Respiratory Rate (breaths/min): 18 Transportation: Private Auto Blood Pressure (mmHg): 128/80 Accompanied By: MOTHER Schedule Follow-up Appointment: Yes Clinical Summary of Care: Electronic Signature(s) Signed: 02/23/2021 4:25:48 PM By: Hansel Feinstein Entered By: Hansel Feinstein on 02/23/2021 15:11:46 Brian Czech ([id number]) -------------------------------------------------------------------------------- Lower Extremity Assessment Details Patient Name: Brian Hams A. Date of Service: 02/23/2021 2:00 PM Medical Record Number: [id number] Patient Account Number: [id number] Date of Birth/Sex: 18-Aug-1991 (29 y.o. M) Treating RN: Hansel Feinstein Primary Care Harriette Tovey: Lilyan Punt Other Clinician: Referring Shauntelle Jamerson: Lilyan Punt Treating Taven Strite/Extender: Tilda Franco in Treatment: 13 Edema Assessment Assessed: [Left: Yes] [Right: No] Edema: [Left: N] [Right: o] Electronic Signature(s) Signed: 02/23/2021 4:25:48 PM By: Hansel Feinstein Entered By: Hansel Feinstein on 02/23/2021 14:16:42 Brian Czech ([id number]) -------------------------------------------------------------------------------- Multi Wound Chart Details Patient Name: Brian Hams A. Date of Service: 02/23/2021 2:00 PM Medical Record Number:  [id number] Patient Account Number: [id number] Date of Birth/Sex: 08-Aug-1991 (29 y.o. M) Treating RN: Hansel Feinstein Primary Care Nima Bamburg: Lilyan Punt Other Clinician: Referring Navaeh Kehres: Lilyan Punt Treating Gage Weant/Extender: Tilda Franco in Treatment: 13 Vital Signs Height(in): 72 Pulse(bpm): 71 Weight(lbs): 185 Blood Pressure(mmHg): 128/80 Body Mass Index(BMI): 25 Temperature(F): 98.3 Respiratory Rate(breaths/min): 18 Photos: [N/A:N/A] Wound Location: Left, Medial Foot Left, Distal Flank N/A Wounding Event: Surgical Injury Surgical Injury N/A Primary Etiology: Trauma, Other Open Surgical Wound N/A Comorbid History: Neuropathy Neuropathy N/A Date Acquired: 06/04/2020 12/31/2020 N/A Weeks of Treatment: 13 4 N/A Wound Status: Open Open N/A Measurements L x W x D (cm) 0.5x0.4x0.4 0.2x0.2x0.1 N/A Area (cm) : 0.157 0.031 N/A Volume (cm) : 0.063 0.003 N/A % Reduction in Area: 92.60% 99.30% N/A % Reduction in Volume: 92.60% 99.40% N/A Starting Position 1 (o'clock):9 Ending Position 1 (o'clock): 10 Maximum Distance 1 (cm): 0.2 Undermining: Yes N/A N/A Classification: Full Thickness Without Exposed Full Thickness Without Exposed N/A Support Structures Support Structures Exudate Amount: Medium Small N/A Exudate Type: Serosanguineous Serosanguineous N/A Exudate Color: red, brown red, brown N/A Granulation Amount: Small (1-33%) Large (67-100%) N/A Granulation Quality: Pink Red, Pink N/A Necrotic Amount: Large (67-100%) Small (1-33%) N/A Necrotic Tissue: Adherent Slough Eschar N/A Exposed Structures: Fat Layer (Subcutaneous Tissue): Fat Layer (Subcutaneous Tissue): N/A Yes Yes Fascia: No Fascia: No Tendon: No Tendon: No Muscle: No Muscle: No Joint: No Joint: No Bone: No Bone: No Epithelialization: N/A Small (1-33%) N/A Debridement: Debridement - Excisional N/A N/A Pre-procedure Verification/Time 14:44 N/A N/A Out Taken: Tissue Debrided: Subcutaneous, Slough  N/A N/A Level: Skin/Subcutaneous Tissue N/A N/A Debridement Area (sq cm): 0.2 N/A N/A Instrument: Curette N/A N/A Bleeding: Minimum N/A N/A Hemostasis Achieved: Pressure N/A N/A STAFFORD, RIVIERA A. ([id number]) Debridement Treatment Procedure was tolerated well N/A N/A Response: Post Debridement 0.9x0.7x0.5 N/A N/A Measurements L x W x D (cm) Post Debridement Volume: 0.247 N/A N/A (cm) Procedures Performed: Debridement N/A N/A Treatment Notes Electronic Signature(s) Signed: 02/23/2021 3:22:40 PM By: Geralyn Corwin DO Entered By: Geralyn Corwin on 02/23/2021 15:03:44 Brian Czech ([id number]) -------------------------------------------------------------------------------- Multi-Disciplinary Care Plan Details Patient Name: Brian Hams A. Date of Service: 02/23/2021 2:00 PM Medical Record Number: [id number] Patient Account Number: [id number] Date of Birth/Sex: 07/23/1991 (29 y.o. M) Treating RN: Hansel Feinstein Primary Care Jamus Loving: Lilyan Punt Other Clinician:  Referring Angla Delahunt: Lilyan Punt Treating Phinneas Shakoor/Extender: Tilda Franco in Treatment: 13 Active Inactive Electronic Signature(s) Signed: 02/23/2021 4:25:48 PM By: Hansel Feinstein Entered By: Hansel Feinstein on 02/23/2021 14:16:58 Brian Czech ([id number]) -------------------------------------------------------------------------------- Pain Assessment Details Patient Name: Brian Hams A. Date of Service: 02/23/2021 2:00 PM Medical Record Number: [id number] Patient Account Number: [id number] Date of Birth/Sex: 1990-10-16 (29 y.o. M) Treating RN: Hansel Feinstein Primary Care Kieth Hartis: Lilyan Punt Other Clinician: Referring Shelvy Perazzo: Lilyan Punt Treating Ladell Lea/Extender: Tilda Franco in Treatment: 13 Active Problems Location of Pain Severity and Description of Pain Patient Has Paino No Site Locations Rate the pain. Current Pain Level: 0 Pain Management and Medication Current Pain  Management: Electronic Signature(s) Signed: 02/23/2021 4:25:48 PM By: Hansel Feinstein Entered By: Hansel Feinstein on 02/23/2021 14:04:43 Brian Czech ([id number]) -------------------------------------------------------------------------------- Patient/Caregiver Education Details Patient Name: Brian Hams A. Date of Service: 02/23/2021 2:00 PM Medical Record Number: [id number] Patient Account Number: [id number] Date of Birth/Gender: 11-08-1990 (29 y.o. M) Treating RN: Hansel Feinstein Primary Care Physician: Lilyan Punt Other Clinician: Referring Physician: Lilyan Punt Treating Physician/Extender: Tilda Franco in Treatment: 13 Education Assessment Education Provided To: Patient and Caregiver mother Education Topics Provided Basic Hygiene: Wound/Skin Impairment: Electronic Signature(s) Signed: 02/23/2021 4:25:48 PM By: Hansel Feinstein Entered By: Hansel Feinstein on 02/23/2021 14:17:41 Brian Czech ([id number]) -------------------------------------------------------------------------------- Wound Assessment Details Patient Name: Brian Hams A. Date of Service: 02/23/2021 2:00 PM Medical Record Number: [id number] Patient Account Number: [id number] Date of Birth/Sex: [birth year] (29 y.o. M) Treating RN: Hansel Feinstein Primary Care Laniah Grimm: Lilyan Punt Other Clinician: Referring Krystall Kruckenberg: Lilyan Punt Treating Tel Hevia/Extender: Tilda Franco in Treatment: 13 Wound Status Wound Number: 1 Primary Etiology: Trauma, Other Wound Location: Left, Medial Foot Wound Status: Open Wounding Event: Surgical Injury Comorbid History: Neuropathy Date Acquired: 06/04/2020 Weeks Of Treatment: 13 Clustered Wound: No Photos Wound Measurements Length: (cm) 0.5 Width: (cm) 0.4 Depth: (cm) 0.4 Area: (cm) 0.157 Volume: (cm) 0.063 % Reduction in Area: 92.6% % Reduction in Volume: 92.6% Tunneling: No Undermining: Yes Starting Position (o'clock): 9 Ending Position (o'clock):  10 Maximum Distance: (cm) 0.2 Wound Description Classification: Full Thickness Without Exposed Support Structu Exudate Amount: Medium Exudate Type: Serosanguineous Exudate Color: red, brown res Foul Odor After Cleansing: No Slough/Fibrino Yes Wound Bed Granulation Amount: Small (1-33%) Exposed Structure Granulation Quality: Pink Fascia Exposed: No Necrotic Amount: Large (67-100%) Fat Layer (Subcutaneous Tissue) Exposed: Yes Necrotic Quality: Adherent Slough Tendon Exposed: No Muscle Exposed: No Joint Exposed: No Bone Exposed: No Treatment Notes Wound #1 (Foot) Wound Laterality: Left, Medial Cleanser Soap and Water ZYRELL, CARMEAN ([id number]) Discharge Instruction: Gently cleanse wound with antibacterial soap, rinse and pat dry prior to dressing wounds Peri-Wound Care Topical Primary Dressing Secondary Dressing ABD Pad 5x9 (in/in) Discharge Instruction: Cover with ABD pad Hydrofera Blue Ready Transfer Foam, 2.5x2.5 (in/in) Discharge Instruction: Apply to wound bed over non-stick dressing. Secured With 34M Medipore H Soft Cloth Surgical Tape, 2x2 (in/yd) Kerlix Roll Sterile or Non-Sterile 6-ply 4.5x4 (yd/yd) Discharge Instruction: Apply Kerlix as directed Sock Compression Wrap Compression Stockings Add-Ons Electronic Signature(s) Signed: 02/23/2021 4:25:48 PM By: Hansel Feinstein Entered By: Hansel Feinstein on 02/23/2021 14:15:01 Brian Czech ([id number]) -------------------------------------------------------------------------------- Wound Assessment Details Patient Name: Brian Hams A. Date of Service: 02/23/2021 2:00 PM Medical Record Number: [id number] Patient Account Number: [id number] Date of Birth/Sex: 08-08-1991 (29 y.o. M) Treating RN: Hansel Feinstein Primary Care Kanav Kazmierczak: Lilyan Punt Other Clinician: Referring Brina Umeda: Lilyan Punt Treating Dorin Stooksbury/Extender: Tilda Franco in Treatment: 13 Wound Status Wound Number:  5 Primary Etiology: Open  Surgical Wound Wound Location: Left, Distal Flank Wound Status: Open Wounding Event: Surgical Injury Comorbid History: Neuropathy Date Acquired: 12/31/2020 Weeks Of Treatment: 4 Clustered Wound: No Photos Wound Measurements Length: (cm) 0.2 Width: (cm) 0.2 Depth: (cm) 0.1 Area: (cm) 0.031 Volume: (cm) 0.003 % Reduction in Area: 99.3% % Reduction in Volume: 99.4% Epithelialization: Small (1-33%) Wound Description Classification: Full Thickness Without Exposed Support Structures Exudate Amount: Small Exudate Type: Serosanguineous Exudate Color: red, brown Foul Odor After Cleansing: No Slough/Fibrino Yes Wound Bed Granulation Amount: Large (67-100%) Exposed Structure Granulation Quality: Red, Pink Fascia Exposed: No Necrotic Amount: Small (1-33%) Fat Layer (Subcutaneous Tissue) Exposed: Yes Necrotic Quality: Eschar Tendon Exposed: No Muscle Exposed: No Joint Exposed: No Bone Exposed: No Treatment Notes Wound #5 (Flank) Wound Laterality: Left, Distal Cleanser Soap and Water Discharge Instruction: DIAL SOAP Gently cleanse wound with antibacterial soap, rinse and pat dry prior to dressing wounds Peri-Wound Care TRAVERS, GOODLEY ([id number]) Topical Primary Dressing Silvercel Small 2x2 (in/in) Discharge Instruction: Apply Silvercel Small 2x2 (in/in) as instructed Secondary Dressing ABD Pad 5x9 (in/in) Discharge Instruction: Cover with ABD pad Secured With 41M Medipore H Soft Cloth Surgical Tape, 2x2 (in/yd) Discharge Instruction: Secure ABD Compression Wrap Compression Stockings Add-Ons Electronic Signature(s) Signed: 02/23/2021 4:25:48 PM By: Hansel Feinstein Entered By: Hansel Feinstein on 02/23/2021 14:16:18 Brian Czech ([id number]) -------------------------------------------------------------------------------- Vitals Details Patient Name: Brian Hams A. Date of Service: 02/23/2021 2:00 PM Medical Record Number: [id number] Patient Account Number: [id number] Date  of Birth/Sex: September 15, 1990 (29 y.o. M) Treating RN: Hansel Feinstein Primary Care Brinton Brandel: Lilyan Punt Other Clinician: Referring Kyi Romanello: Lilyan Punt Treating Vonita Calloway/Extender: Tilda Franco in Treatment: 13 Vital Signs Time Taken: 14:04 Temperature (F): 98.3 Height (in): 72 Pulse (bpm): 71 Weight (lbs): 185 Respiratory Rate (breaths/min): 18 Body Mass Index (BMI): 25.1 Blood Pressure (mmHg): 128/80 Reference Range: 80 - 120 mg / dl Electronic Signature(s) Signed: 02/23/2021 4:25:48 PM By: Hansel Feinstein Entered ByHansel Feinstein on 02/23/2021 14:04:33
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
## 2021-02-24 ENCOUNTER — Other Ambulatory Visit: Payer: Self-pay

## 2021-02-24 ENCOUNTER — Ambulatory Visit (HOSPITAL_COMMUNITY): Payer: 59 | Admitting: Physical Therapy

## 2021-02-24 DIAGNOSIS — M79672 Pain in left foot: Secondary | ICD-10-CM | POA: Diagnosis not present

## 2021-02-24 DIAGNOSIS — M7989 Other specified soft tissue disorders: Secondary | ICD-10-CM | POA: Diagnosis not present

## 2021-02-24 DIAGNOSIS — S91302A Unspecified open wound, left foot, initial encounter: Secondary | ICD-10-CM | POA: Diagnosis not present

## 2021-02-24 DIAGNOSIS — I2699 Other pulmonary embolism without acute cor pulmonale: Secondary | ICD-10-CM | POA: Diagnosis not present

## 2021-02-24 DIAGNOSIS — Z7289 Other problems related to lifestyle: Secondary | ICD-10-CM | POA: Diagnosis not present

## 2021-02-24 DIAGNOSIS — I82412 Acute embolism and thrombosis of left femoral vein: Secondary | ICD-10-CM | POA: Diagnosis not present

## 2021-02-24 DIAGNOSIS — M6281 Muscle weakness (generalized): Secondary | ICD-10-CM

## 2021-02-24 DIAGNOSIS — Z86718 Personal history of other venous thrombosis and embolism: Secondary | ICD-10-CM | POA: Diagnosis not present

## 2021-02-24 DIAGNOSIS — R269 Unspecified abnormalities of gait and mobility: Secondary | ICD-10-CM | POA: Diagnosis not present

## 2021-02-24 DIAGNOSIS — M25572 Pain in left ankle and joints of left foot: Secondary | ICD-10-CM | POA: Diagnosis not present

## 2021-02-24 DIAGNOSIS — F17210 Nicotine dependence, cigarettes, uncomplicated: Secondary | ICD-10-CM | POA: Diagnosis not present

## 2021-02-24 DIAGNOSIS — R262 Difficulty in walking, not elsewhere classified: Secondary | ICD-10-CM
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
## 2021-03-01 ENCOUNTER — Ambulatory Visit (HOSPITAL_COMMUNITY): Payer: 59 | Admitting: Physical Therapy

## 2021-03-01 ENCOUNTER — Inpatient Hospital Stay (HOSPITAL_COMMUNITY): Payer: 59 | Attending: Hematology | Admitting: Physician Assistant

## 2021-03-01 ENCOUNTER — Other Ambulatory Visit: Payer: Self-pay

## 2021-03-01 VITALS — BP 133/68 | HR 71 | Temp 96.9°F | Resp 18

## 2021-03-01 DIAGNOSIS — M25572 Pain in left ankle and joints of left foot: Secondary | ICD-10-CM | POA: Insufficient documentation

## 2021-03-01 DIAGNOSIS — M79672 Pain in left foot: Secondary | ICD-10-CM | POA: Diagnosis not present

## 2021-03-01 DIAGNOSIS — M6281 Muscle weakness (generalized): Secondary | ICD-10-CM | POA: Insufficient documentation

## 2021-03-01 DIAGNOSIS — R262 Difficulty in walking, not elsewhere classified: Secondary | ICD-10-CM

## 2021-03-01 DIAGNOSIS — F17210 Nicotine dependence, cigarettes, uncomplicated: Secondary | ICD-10-CM | POA: Diagnosis not present

## 2021-03-01 DIAGNOSIS — I82412 Acute embolism and thrombosis of left femoral vein: Secondary | ICD-10-CM | POA: Diagnosis not present

## 2021-03-01 DIAGNOSIS — M25632 Stiffness of left wrist, not elsewhere classified: Secondary | ICD-10-CM | POA: Insufficient documentation

## 2021-03-01 DIAGNOSIS — Z79899 Other long term (current) drug therapy: Secondary | ICD-10-CM | POA: Insufficient documentation

## 2021-03-01 DIAGNOSIS — M7989 Other specified soft tissue disorders: Secondary | ICD-10-CM | POA: Diagnosis not present

## 2021-03-01 DIAGNOSIS — M25622 Stiffness of left elbow, not elsewhere classified: Secondary | ICD-10-CM | POA: Insufficient documentation

## 2021-03-01 DIAGNOSIS — I824Y2 Acute embolism and thrombosis of unspecified deep veins of left proximal lower extremity: Secondary | ICD-10-CM

## 2021-03-01 DIAGNOSIS — Z86718 Personal history of other venous thrombosis and embolism: Secondary | ICD-10-CM | POA: Diagnosis not present

## 2021-03-01 DIAGNOSIS — Z881 Allergy status to other antibiotic agents status: Secondary | ICD-10-CM | POA: Insufficient documentation

## 2021-03-01 DIAGNOSIS — R269 Unspecified abnormalities of gait and mobility: Secondary | ICD-10-CM | POA: Diagnosis not present

## 2021-03-01 DIAGNOSIS — Z7289 Other problems related to lifestyle: Secondary | ICD-10-CM | POA: Diagnosis not present

## 2021-03-01 DIAGNOSIS — I2699 Other pulmonary embolism without acute cor pulmonale: Secondary | ICD-10-CM | POA: Insufficient documentation

## 2021-03-01 NOTE — Patient Instructions (Signed)
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    At this time, we recommend that you continue taking your Eliquis 5 mg twice daily.  Before taking you off of Eliquis, your functional status will need to improve.  Additionally, I would like you to be seen by vascular surgery to evaluate any underlying abnormalities in your blood flow and vessel structure that may predispose you to blood clots in the future.  They may also be able to comment on any low blood flow that might be affecting the slow wound healing in your left foot.  We will reevaluate you in 6 months.  If your functional status has improved to the point that your risk for blood clots is lower, we will run further testing to see if you can safely come off of the Eliquis.  If you experience any significant bleeding while on Eliquis, please call our office, or if it is severe, seek immediate medical attention in the emergency department.  LABS: Return in 6 months for labs  OTHER: Referral to vascular surgery  MEDICATIONS: Continue Eliquis 5 mg twice daily  FOLLOW-UP APPOINTMENT: Office visit in 6 months   Thank you for choosing [facility] at [facility] to provide your oncology and hematology care.  To afford each patient quality time with our provider, please arrive at least 15 minutes before your scheduled appointment time.   If you have a lab appointment with the Cancer Center please come in thru the Main Entrance and check in at the main information desk.  You need to re-schedule your appointment should you arrive 10 or more minutes late.  We strive to give you quality time with our providers, and arriving late affects you and other patients whose appointments are after yours.  Also, if you no show three or more times for appointments you may be dismissed from the clinic at the providers  discretion.     Again, thank you for choosing [facility].  Our hope is that these requests will decrease the amount of time that you wait before being seen by our physicians.       _____________________________________________________________  Should you have questions after your visit to [facility], please contact our office at [phone] and follow the prompts.  Our office hours are 8:00 a.m. and 4:30 p.m. Monday - Friday.  Please note that voicemails left after 4:00 p.m. may not be returned until the following business day.  We are closed weekends and major holidays.  You do have access to a nurse 24-7, just call the main number to the clinic [phone] and do not press any options, hold on the line and a nurse will answer the phone.    For prescription refill requests, have your pharmacy contact our office and allow 72 hours.    Due to Covid, you will need to wear a mask upon entering the hospital. If you do not have a mask, a mask will be given to you at the Main Entrance upon arrival. For doctor visits, patients may have 1 support person age 30 or older with them. For treatment visits, patients can not have anyone with them due to social distancing guidelines and our immunocompromised population.

## 2021-03-01 NOTE — Patient Instructions (Signed)
Functional Quadriceps: Sit to Stand    Sit on edge of chair, feet flat on floor. Stand upright, extending knees fully.  Repeat __10__ times per set. _2_per day.  FUNCTIONAL MOBILITY: Squat    Stance: shoulder-width on floor. Bend hips and knees. Keep back straight. Do not allow knees to bend past toes. Squeeze glutes and quads to stand. _10__ reps per set, __2_ sets per day  Forward Step-Up    Move onto step with one foot, then the other. Step back off the same way.  Use __4__ inch step. Repeat _10___ times  _2___ sessions per day.   Forward Lunge    Standing with feet shoulder width apart and stomach tight, step forward with left leg.  Repeat _10___ times per set. Do __2__ sessions per day.
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
## 2021-03-02 ENCOUNTER — Encounter (HOSPITAL_BASED_OUTPATIENT_CLINIC_OR_DEPARTMENT_OTHER): Payer: 59 | Admitting: Internal Medicine

## 2021-03-02 DIAGNOSIS — L97528 Non-pressure chronic ulcer of other part of left foot with other specified severity: Secondary | ICD-10-CM | POA: Diagnosis not present

## 2021-03-02 DIAGNOSIS — Z87891 Personal history of nicotine dependence: Secondary | ICD-10-CM | POA: Diagnosis not present

## 2021-03-02 DIAGNOSIS — L97521 Non-pressure chronic ulcer of other part of left foot limited to breakdown of skin: Secondary | ICD-10-CM | POA: Diagnosis not present

## 2021-03-02 DIAGNOSIS — T8131XD Disruption of external operation (surgical) wound, not elsewhere classified, subsequent encounter: Secondary | ICD-10-CM | POA: Diagnosis not present

## 2021-03-02 DIAGNOSIS — Z89432 Acquired absence of left foot: Secondary | ICD-10-CM | POA: Diagnosis not present

## 2021-03-02 DIAGNOSIS — L98428 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: Secondary | ICD-10-CM | POA: Diagnosis not present

## 2021-03-03 ENCOUNTER — Other Ambulatory Visit: Payer: Self-pay

## 2021-03-03 ENCOUNTER — Ambulatory Visit (HOSPITAL_COMMUNITY): Payer: 59 | Admitting: Physical Therapy

## 2021-03-03 ENCOUNTER — Other Ambulatory Visit (HOSPITAL_COMMUNITY): Payer: Self-pay

## 2021-03-03 DIAGNOSIS — R269 Unspecified abnormalities of gait and mobility: Secondary | ICD-10-CM | POA: Diagnosis not present

## 2021-03-03 DIAGNOSIS — I82412 Acute embolism and thrombosis of left femoral vein: Secondary | ICD-10-CM | POA: Diagnosis not present

## 2021-03-03 DIAGNOSIS — F17210 Nicotine dependence, cigarettes, uncomplicated: Secondary | ICD-10-CM | POA: Diagnosis not present

## 2021-03-03 DIAGNOSIS — M7989 Other specified soft tissue disorders: Secondary | ICD-10-CM | POA: Diagnosis not present

## 2021-03-03 DIAGNOSIS — Z86718 Personal history of other venous thrombosis and embolism: Secondary | ICD-10-CM | POA: Diagnosis not present

## 2021-03-03 DIAGNOSIS — I2699 Other pulmonary embolism without acute cor pulmonale: Secondary | ICD-10-CM | POA: Diagnosis not present

## 2021-03-03 DIAGNOSIS — M79672 Pain in left foot: Secondary | ICD-10-CM | POA: Diagnosis not present

## 2021-03-03 DIAGNOSIS — R262 Difficulty in walking, not elsewhere classified: Secondary | ICD-10-CM

## 2021-03-03 DIAGNOSIS — Z7289 Other problems related to lifestyle: Secondary | ICD-10-CM | POA: Diagnosis not present

## 2021-03-03 DIAGNOSIS — M6281 Muscle weakness (generalized): Secondary | ICD-10-CM

## 2021-03-03 DIAGNOSIS — M25572 Pain in left ankle and joints of left foot: Secondary | ICD-10-CM | POA: Diagnosis not present
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
## 2021-03-03 NOTE — Progress Notes (Addendum)
SAHITH, NURSE ([id number]) Visit Report for 03/02/2021 Arrival Information Details Patient Name: [name], [name]. Date of Service: 03/02/2021 2:00 PM Medical Record Number: [id number] Patient Account Number: [id number] Date of Birth/Sex: 1991/06/08 (29 y.o. M) Treating RN: [name] Primary Care [name]: [name] Other Clinician: Referring [name]: [name] Treating [name]/Extender: [name] in Treatment: 14 Visit Information History Since Last Visit Added or deleted any medications: No Patient Arrived: Crutches Had a fall or experienced change in No Arrival Time: 14:09 activities of daily living that may affect Accompanied By: mother risk of falls: Transfer Assistance: None Hospitalized since last visit: No Patient Identification Verified: Yes Has Dressing in Place as Prescribed: Yes Secondary Verification Process Completed: Yes Pain Present Now: Yes Patient Has Alerts: Yes Patient Alerts: Patient on Blood Thinner Eliquis NOT DIABETIC Electronic Signature(s) Signed: 03/03/2021 4:28:40 PM By: [name] Entered By: [name] on 03/02/2021 14:12:33 [name] ([id number]) -------------------------------------------------------------------------------- Clinic Level of Care Assessment Details Patient Name: [name] A. Date of Service: 03/02/2021 2:00 PM Medical Record Number: [id number] Patient Account Number: [id number] Date of Birth/Sex: [birth year] (29 y.o. M) Treating RN: [name] Primary Care Davine Coba: [name] Other Clinician: Referring [name]: [name] Treating [name]/Extender: [name] in Treatment: 14 Clinic Level of Care Assessment Items TOOL 4 Quantity Score []  - Use when only an EandM is performed on FOLLOW-UP visit 0 ASSESSMENTS - Nursing Assessment / Reassessment []  - Reassessment of Co-morbidities (includes updates in patient status) 0 []  - 0 Reassessment of Adherence to Treatment  Plan ASSESSMENTS - Wound and Skin Assessment / Reassessment []  - Simple Wound Assessment / Reassessment - one wound 0 X- 2 5 Complex Wound Assessment / Reassessment - multiple wounds []  - 0 Dermatologic / Skin Assessment (not related to wound area) ASSESSMENTS - Focused Assessment []  - Circumferential Edema Measurements - multi extremities 0 []  - 0 Nutritional Assessment / Counseling / Intervention []  - 0 Lower Extremity Assessment (monofilament, tuning fork, pulses) []  - 0 Peripheral Arterial Disease Assessment (using hand held doppler) ASSESSMENTS - Ostomy and/or Continence Assessment and Care []  - Incontinence Assessment and Management 0 []  - 0 Ostomy Care Assessment and Management (repouching, etc.) PROCESS - Coordination of Care X - Simple Patient / Family Education for ongoing care 1 15 []  - 0 Complex (extensive) Patient / Family Education for ongoing care []  - 0 Staff obtains , Records, Test Results / Process Orders []  - 0 Staff telephones HHA, Nursing Homes / Clarify orders / etc []  - 0 Routine Transfer to another Facility (non-emergent condition) []  - 0 Routine Hospital Admission (non-emergent condition) []  - 0 New Admissions / / Ordering NPWT, Apligraf, etc. []  - 0 Emergency Hospital Admission (emergent condition) X- 1 10 Simple Discharge Coordination []  - 0 Complex (extensive) Discharge Coordination PROCESS - Special Needs []  - Pediatric / Minor Patient Management 0 []  - 0 Isolation Patient Management []  - 0 Hearing / Language / Visual special needs []  - 0 Assessment of Community assistance (transportation, D/C planning, etc.) []  - 0 Additional assistance / Altered mentation []  - 0 Support Surface(s) Assessment (bed, cushion, seat, etc.) INTERVENTIONS - Wound Cleansing / Measurement [name], [name] ( ) X- 1 5 Simple Wound Cleansing - one wound []  - 0 Complex Wound Cleansing - multiple wounds []  - 0 Wound  Imaging (photographs - any number of wounds) []  - 0 Wound Tracing (instead of photographs) []  - 0 Simple Wound Measurement - one wound []  - 0 Complex  Wound Measurement - multiple wounds INTERVENTIONS - Wound Dressings X - Small Wound Dressing one or multiple wounds 1 10 []  - 0 Medium Wound Dressing one or multiple wounds []  - 0 Large Wound Dressing one or multiple wounds []  - 0 Application of Medications - topical []  - 0 Application of Medications - injection INTERVENTIONS - Miscellaneous []  - External ear exam 0 []  - 0 Specimen Collection (cultures, biopsies, blood, body fluids, etc.) []  - 0 Specimen(s) / Culture(s) sent or taken to Lab for analysis []  - 0 Patient Transfer (multiple staff / Nurse, adultHoyer Lift / Similar devices) []  - 0 Simple Staple / Suture removal (25 or less) []  - 0 Complex Staple / Suture removal (26 or more) []  - 0 Hypo / Hyperglycemic Management (close monitor of Blood Glucose) []  - 0 Ankle / Brachial Index (ABI) - do not check if billed separately X- 1 5 Vital Signs Has the patient been seen at the hospital within the last three years: Yes Total Score: 55 Level Of Care: New/Established - Level 2 Electronic Signature(s) Signed: 03/03/2021 4:28:40 PM By: Brian FeinsteinBishop, Lee Entered By: Brian FeinsteinBishop, Lee on 03/02/2021 14:53:44 Brian CzechWALKER, [name] ([id number]) -------------------------------------------------------------------------------- Encounter Discharge Information Details Patient Name: Brian HamsWALKER, [name] Date of Service: 03/02/2021 2:00 PM Medical Record Number: [id number] Patient Account Number: [id number] Date of Birth/Sex: 11/19/1990 (29 y.o. M) Treating RN: Brian FeinsteinBishop, Lee Primary Care Teondra Newburg: Brian PuntLuking, Brian Other Clinician: Referring [name]: Brian PuntLuking, Brian Treating Nataya Bastedo/Extender: Brian FrancoHoffman, [name] in Treatment: 14 Encounter Discharge Information Items Discharge Condition: Stable Ambulatory Status: Crutches Discharge Destination: Home Transportation:  Private Auto Accompanied By: mother Schedule Follow-up Appointment: Yes Clinical Summary of Care: Electronic Signature(s) Signed: 03/03/2021 4:28:40 PM By: Brian FeinsteinBishop, Lee Entered By: Brian FeinsteinBishop, Lee on 03/02/2021 15:18:29 Brian CzechWALKER, [name] ([id number]) -------------------------------------------------------------------------------- Lower Extremity Assessment Details Patient Name: Brian HamsWALKER, [name] Date of Service: 03/02/2021 2:00 PM Medical Record Number: [id number] Patient Account Number: [id number] Date of Birth/Sex: 11/28/1990 (29 y.o. M) Treating RN: Brian FeinsteinBishop, Lee Primary Care Prajwal Fellner: Brian PuntLuking, Brian Other Clinician: Referring Kittie Krizan: Brian PuntLuking, Brian Treating Patrice Moates/Extender: Brian FrancoHoffman, [name] in Treatment: 14 Edema Assessment Assessed: [Left: Yes] [Right: No] Edema: [Left: N] [Right: o] Electronic Signature(s) Signed: 03/03/2021 4:28:40 PM By: Brian FeinsteinBishop, Lee Entered By: Brian FeinsteinBishop, Lee on 03/02/2021 14:23:33 Brian CzechWALKER, [name] ([id number]) -------------------------------------------------------------------------------- Multi Wound Chart Details Patient Name: Brian HamsWALKER, [name] Date of Service: 03/02/2021 2:00 PM Medical Record Number: [id number] Patient Account Number: [id number] Date of Birth/Sex: 03/26/1991 (29 y.o. M) Treating RN: Brian FeinsteinBishop, Lee Primary Care Trilby Way: Brian PuntLuking, Brian Other Clinician: Referring Nou Chard: Brian PuntLuking, Brian Treating Malene Blaydes/Extender: Brian FrancoHoffman, [name] in Treatment: 14 Vital Signs Height(in): 72 Pulse(bpm): 80 Weight(lbs): 185 Blood Pressure(mmHg): 124/70 Body Mass Index(BMI): 25 Temperature(F): 98.2 Respiratory Rate(breaths/min): 16 Photos: [N/A:N/A] Wound Location: Left, Medial Foot Left, Distal Flank N/A Wounding Event: Surgical Injury Surgical Injury N/A Primary Etiology: Trauma, Other Open Surgical Wound N/A Comorbid History: Neuropathy Neuropathy N/A Date Acquired: 06/04/2020 12/31/2020 N/A Lee of Treatment: 14 5 N/A Wound Status: Open Open  N/A Measurements L x W x D (cm) 0.6x0.6x0.4 0x0x0 N/A Area (cm) : 0.283 0 N/A Volume (cm) : 0.113 0 N/A % Reduction in Area: 86.80% 100.00% N/A % Reduction in Volume: 86.80% 100.00% N/A Starting Position 1 (o'clock): 9 Ending Position 1 (o'clock): 10 Maximum Distance 1 (cm): 0.2 Undermining: Yes No N/A Classification: Full Thickness Without Exposed Full Thickness Without Exposed N/A Support Structures Support Structures Exudate Amount: Medium None Present N/A Exudate Type: Serosanguineous N/A N/A Exudate Color: red, brown N/A N/A Granulation Amount: Large (67-100%)  None Present (0%) N/A Granulation Quality: Pink N/A N/A Necrotic Amount: Small (1-33%) None Present (0%) N/A Exposed Structures: Fat Layer (Subcutaneous Tissue): Fat Layer (Subcutaneous Tissue): N/A Yes Yes Fascia: No Fascia: No Tendon: No Tendon: No Muscle: No Muscle: No Joint: No Joint: No Bone: No Bone: No Epithelialization: N/A Large (67-100%) N/A Treatment Notes Wound #1 (Foot) Wound Laterality: Left, Medial Cleanser Soap and Water Discharge Instruction: Gently cleanse wound with antibacterial soap, rinse and pat dry prior to dressing wounds [name], [name] ([id number]) Peri-Wound Care Topical Primary Dressing Prisma 4.34 (in) Discharge Instruction: Moisten with KY jelly/hydrogel; Cover wound as directed. Do not remove from wound bed. Secondary Dressing ABD Pad 5x9 (in/in) Discharge Instruction: Cover with ABD pad Secured With 23M Medipore H Soft Cloth Surgical Tape, 2x2 (in/yd) Kerlix Roll Sterile or Non-Sterile 6-ply 4.5x4 (yd/yd) Discharge Instruction: Apply Kerlix as directed Tubigrip Size D, 3x10 (in/yd) Discharge Instruction: Use on top for light compression Compression Wrap Compression Stockings Add-Ons Wound #5 (Flank) Wound Laterality: Left, Distal Cleanser Peri-Wound Care Topical Primary Dressing Secondary Dressing Secured With Compression Wrap Compression  Stockings Add-Ons Electronic Signature(s) Signed: 03/04/2021 2:55:14 PM By: Geralyn Corwin DO Previous Signature: 03/03/2021 4:28:40 PM Version By: [name] Entered By: Geralyn Corwin on 03/04/2021 14:49:01 [name] ([id number]) -------------------------------------------------------------------------------- Multi-Disciplinary Care Plan Details Patient Name: [name] A. Date of Service: 03/02/2021 2:00 PM Medical Record Number: [id number] Patient Account Number: [id number] Date of Birth/Sex: 11-24-1990 (29 y.o. M) Treating RN: [name] Primary Care Collin Hendley: [name] Other Clinician: Referring Niomie Englert: [name] Treating Daney Moor/Extender: [name] in Treatment: 14 Active Inactive Electronic Signature(s) Signed: 03/03/2021 4:28:40 PM By: [name] Entered By: [name] on 03/02/2021 14:23:41 [name] ([id number]) -------------------------------------------------------------------------------- Pain Assessment Details Patient Name: [name] A. Date of Service: 03/02/2021 2:00 PM Medical Record Number: [id number] Patient Account Number: [id number] Date of Birth/Sex: Dec 07, 1990 (29 y.o. M) Treating RN: [name] Primary Care Sarabella Caprio: [name] Other Clinician: Referring Josceline Chenard: [name] Treating Deloma Spindle/Extender: [name] in Treatment: 14 Active Problems Location of Pain Severity and Description of Pain Patient Has Paino Yes Site Locations Pain Location: Pain in Ulcers Rate the pain. Current Pain Level: 3 Pain Management and Medication Current Pain Management: Electronic Signature(s) Signed: 03/03/2021 4:28:40 PM By: [name] Entered By: [name] on 03/02/2021 14:15:39 [name] ([id number]) -------------------------------------------------------------------------------- Patient/Caregiver Education Details Patient Name: [name] A. Date of Service: 03/02/2021 2:00  PM Medical Record Number: [id number] Patient Account Number: [id number] Date of Birth/Gender: 1990-09-16 (29 y.o. M) Treating RN: [name] Primary Care Physician: [name] Other Clinician: Referring Physician: Lilyan Lee Treating Physician/Extender: [name] in Treatment: 14 Education Assessment Education Provided To: Patient and Caregiver Education Topics Provided Basic Hygiene: Nutrition: Offloading: Wound/Skin Impairment: Electronic Signature(s) Signed: 03/03/2021 4:28:40 PM By: [name] Entered By: [name] on 03/02/2021 14:54:22 [name] ([id number]) -------------------------------------------------------------------------------- Wound Assessment Details Patient Name: [name] A. Date of Service: 03/02/2021 2:00 PM Medical Record Number: [id number] Patient Account Number: [id number] Date of Birth/Sex: 31-Mar-1991 (29 y.o. M) Treating RN: [name] Primary Care Alashia Brownfield: [name] Other Clinician: Referring Taesha Goodell: [name] Treating Alianys Chacko/Extender: [name] in Treatment: 14 Wound Status Wound Number: 1 Primary Etiology: Trauma, Other Wound Location: Left, Medial Foot Wound Status: Open Wounding Event: Surgical Injury Comorbid History: Neuropathy Date Acquired: 06/04/2020 Lee Of Treatment: 14 Clustered Wound: No Photos Wound Measurements Length: (cm) 0.6 Width: (cm) 0.6 Depth: (cm) 0.4 Area: (cm) 0.283 Volume: (cm) 0.113 %  Reduction in Area: 86.8% % Reduction in Volume: 86.8% Tunneling: No Undermining: Yes Starting Position (o'clock): 9 Ending Position (o'clock): 10 Maximum Distance: (cm) 0.2 Wound Description Classification: Full Thickness Without Exposed Support Structu Exudate Amount: Medium Exudate Type: Serosanguineous Exudate Color: red, brown res Foul Odor After Cleansing: No Slough/Fibrino Yes Wound Bed Granulation Amount: Large (67-100%) Exposed Structure Granulation  Quality: Pink Fascia Exposed: No Necrotic Amount: Small (1-33%) Fat Layer (Subcutaneous Tissue) Exposed: Yes Necrotic Quality: Adherent Slough Tendon Exposed: No Muscle Exposed: No Joint Exposed: No Bone Exposed: No Treatment Notes Wound #1 (Foot) Wound Laterality: Left, Medial Cleanser Soap and Water [name], [name] ([id number]) Discharge Instruction: Gently cleanse wound with antibacterial soap, rinse and pat dry prior to dressing wounds Peri-Wound Care Topical Primary Dressing Prisma 4.34 (in) Discharge Instruction: Moisten with KY jelly/hydrogel; Cover wound as directed. Do not remove from wound bed. Secondary Dressing ABD Pad 5x9 (in/in) Discharge Instruction: Cover with ABD pad Secured With 49M Medipore H Soft Cloth Surgical Tape, 2x2 (in/yd) Kerlix Roll Sterile or Non-Sterile 6-ply 4.5x4 (yd/yd) Discharge Instruction: Apply Kerlix as directed Tubigrip Size D, 3x10 (in/yd) Discharge Instruction: Use on top for light compression Compression Wrap Compression Stockings Add-Ons Electronic Signature(s) Signed: 03/03/2021 4:28:40 PM By: [name] Entered By: [name] on 03/02/2021 14:22:31 [name] ([id number]) -------------------------------------------------------------------------------- Wound Assessment Details Patient Name: [name] A. Date of Service: 03/02/2021 2:00 PM Medical Record Number: [id number] Patient Account Number: [id number] Date of Birth/Sex: 19-Dec-1990 (29 y.o. M) Treating RN: [name] Primary Care Alvine Mostafa: [name] Other Clinician: Referring Atticus Lemberger: [name] Treating Khiyan Crace/Extender: [name] in Treatment: 14 Wound Status Wound Number: 5 Primary Etiology: Open Surgical Wound Wound Location: Left, Distal Flank Wound Status: Open Wounding Event: Surgical Injury Comorbid History: Neuropathy Date Acquired: 12/31/2020 Lee Of Treatment: 5 Clustered Wound: No Photos Wound Measurements Length:  (cm) Width: (cm) Depth: (cm) Area: (cm) Volume: (cm) 0 % Reduction in Area: 100% 0 % Reduction in Volume: 100% 0 Epithelialization: Large (67-100%) 0 Tunneling: No 0 Undermining: No Wound Description Classification: Full Thickness Without Exposed Support Structure Exudate Amount: None Present s Foul Odor After Cleansing: No Slough/Fibrino No Wound Bed Granulation Amount: None Present (0%) Exposed Structure Necrotic Amount: None Present (0%) Fascia Exposed: No Fat Layer (Subcutaneous Tissue) Exposed: Yes Tendon Exposed: No Muscle Exposed: No Joint Exposed: No Bone Exposed: No Electronic Signature(s) Signed: 03/03/2021 4:28:40 PM By: [name] Entered By: [name] on 03/02/2021 14:23:12 [name] ([id number]) -------------------------------------------------------------------------------- Vitals Details Patient Name: [name] A. Date of Service: 03/02/2021 2:00 PM Medical Record Number: [id number] Patient Account Number: [id number] Date of Birth/Sex: 02/12/1991 (29 y.o. M) Treating RN: [name] Primary Care Scarlettrose Costilow: [name] Other Clinician: Referring Keili Hasten: [name] Treating Emojean Gertz/Extender: [name] in Treatment: 14 Vital Signs Time Taken: 14:14 Temperature (F): 98.2 Height (in): 72 Pulse (bpm): 80 Weight (lbs): 185 Respiratory Rate (breaths/min): 16 Body Mass Index (BMI): 25.1 Blood Pressure (mmHg): 124/70 Reference Range: 80 - 120 mg / dl Electronic Signature(s) Signed: 03/03/2021 4:28:40 PM By: [name] Entered ByHansel Lee on 03/02/2021 14:15:28

## 2021-03-03 NOTE — Progress Notes (Addendum)
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

## 2021-03-07 NOTE — Progress Notes (Signed)
[name] ([id number]) Visit Report for 02/16/2021 Arrival Information Details Patient Name: [name], [name]. Date of Service: 02/16/2021 2:00 PM Medical Record Number: [id number] Patient Account Number: [id number] Date of Birth/Sex: 1990-10-01 (29 y.o. M) Treating RN: [name] Primary Care Larrie Lucia: [name] Other Clinician: Referring [name]: [name] Treating [name]/Extender: [name] in Treatment: 12 Visit Information History Since Last Visit Added or deleted any medications: No Patient Arrived: Crutches Had a fall or experienced change in No Arrival Time: 13:56 activities of daily living that may affect Accompanied By: mother risk of falls: Transfer Assistance: None Hospitalized since last visit: No Patient Has Alerts: Yes Has Dressing in Place as Prescribed: Yes Patient Alerts: Patient on Blood Thinner Pain Present Now: No Eliquis NOT DIABETIC Electronic Signature(s) Signed: 02/16/2021 3:28:20 PM By: [name] Entered By: [name] on 02/16/2021 13:57:05 [name] ([id number]) -------------------------------------------------------------------------------- Encounter Discharge Information Details Patient Name: Brian Hams A. Date of Service: 02/16/2021 2:00 PM Medical Record Number: [id number] Patient Account Number: [id number] Date of Birth/Sex: 04-29-1991 (29 y.o. M) Treating RN: [name] Primary Care Vivyan Biggers: [name] Other Clinician: Referring [name]: [name] Treating [name]/Extender: [name] in Treatment: 12 Encounter Discharge Information Items Post Procedure Vitals Discharge Condition: Stable Temperature (F): 98.6 Ambulatory Status: Crutches Pulse (bpm): 66 Discharge Destination: Home Respiratory Rate (breaths/min): 16 Transportation: Private Auto Blood Pressure (mmHg): 123/79 Accompanied By: self Schedule Follow-up Appointment: Yes Clinical Summary of Care: Patient  Declined Electronic Signature(s) Signed: 03/07/2021 1:36:15 PM By: [name], BSN, RN, CWS, Kim RN, BSN Entered By: [name], BSN, RN, CWS, [name] on 02/16/2021 15:03:23 [name] ([id number]) -------------------------------------------------------------------------------- Multi Wound Chart Details Patient Name: Brian Hams A. Date of Service: 02/16/2021 2:00 PM Medical Record Number: [id number] Patient Account Number: [id number] Date of Birth/Sex: 01-15-1991 (29 y.o. M) Treating RN: [name] Primary Care Aleesia Henney: [name] Other Clinician: Referring [name]: [name] Treating Shannen Vernon/Extender: [name] in Treatment: 12 Vital Signs Height(in): 72 Pulse(bpm): 66 Weight(lbs): 185 Blood Pressure(mmHg): 123/79 Body Mass Index(BMI): 25 Temperature(F): 98.6 Respiratory Rate(breaths/min): 16 Photos: [1:No Photos] [N/A:N/A] Wound Location: Left, Medial Foot Left, Distal Flank N/A Wounding Event: Surgical Injury Surgical Injury N/A Primary Etiology: Trauma, Other Open Surgical Wound N/A Comorbid History: Neuropathy Neuropathy N/A Date Acquired: 06/04/2020 12/31/2020 N/A Weeks of Treatment: 12 3 N/A Wound Status: Open Open N/A Measurements L x W x D (cm) 0.4x0.3x0.4 0.1x0.2x0.1 N/A Area (cm) : 0.094 0.016 N/A Volume (cm) : 0.038 0.002 N/A % Reduction in Area: 95.60% 99.70% N/A % Reduction in Volume: 95.60% 99.60% N/A Classification: Full Thickness Without Exposed Full Thickness Without Exposed N/A Support Structures Support Structures Exudate Amount: Medium Small N/A Exudate Type: Serosanguineous Serosanguineous N/A Exudate Color: red, brown red, brown N/A Granulation Amount: Small (1-33%) Large (67-100%) N/A Granulation Quality: Pink Red, Pink N/A Necrotic Amount: Large (67-100%) Small (1-33%) N/A Necrotic Tissue: Adherent Slough Eschar N/A Exposed Structures: Fat Layer (Subcutaneous Tissue): Fat Layer (Subcutaneous Tissue): N/A Yes Yes Fascia:  No Fascia: No Tendon: No Tendon: No Muscle: No Muscle: No Joint: No Joint: No Bone: No Bone: No Epithelialization: N/A Small (1-33%) N/A Debridement: Debridement - Excisional N/A N/A Pre-procedure Verification/Time 14:47 N/A N/A Out Taken: Tissue Debrided: Subcutaneous, Slough N/A N/A Level: Skin/Subcutaneous Tissue N/A N/A Debridement Area (sq cm): 0.12 N/A N/A Instrument: Curette N/A N/A Bleeding: Minimum N/A N/A Hemostasis Achieved: Pressure N/A N/A Debridement Treatment Procedure was tolerated well N/A N/A Response: Post Debridement 0.4x0.3x0.4 N/A N/A Measurements L x W x D (cm)  KNOX, CERVI ([id number]) Post Debridement Volume: 0.038 N/A N/A (cm) Procedures Performed: Debridement N/A N/A Treatment Notes Electronic Signature(s) Signed: 02/16/2021 2:58:35 PM By: Geralyn Corwin DO Entered By: Geralyn Corwin on 02/16/2021 14:51:06 [name] ([id number]) -------------------------------------------------------------------------------- Multi-Disciplinary Care Plan Details Patient Name: Brian Hams A. Date of Service: 02/16/2021 2:00 PM Medical Record Number: [id number] Patient Account Number: [id number] Date of Birth/Sex: May 26, 1991 (29 y.o. M) Treating RN: [name] Primary Care Zeus Marquis: [name] Other Clinician: Referring [name]: [name] Treating [name]/Extender: [name] in Treatment: 12 Active Inactive Peripheral Neuropathy Nursing Diagnoses: Knowledge deficit related to disease process and management of peripheral neurovascular dysfunction Goals: Patient/caregiver will verbalize understanding of disease process and disease management Date Initiated: 11/24/2020 Target Resolution Date: 12/01/2020 Goal Status: Active Interventions: Assess signs and symptoms of neuropathy upon admission and as needed Notes: Wound/Skin Impairment Nursing Diagnoses: Impaired tissue integrity Goals: Patient/caregiver will verbalize  understanding of skin care regimen Date Initiated: 11/24/2020 Target Resolution Date: 11/24/2020 Goal Status: Active Interventions: Assess patient/caregiver ability to obtain necessary supplies Assess ulceration(s) every visit Treatment Activities: Skin care regimen initiated : 11/24/2020 Topical wound management initiated : 11/24/2020 Notes: Electronic Signature(s) Signed: 03/07/2021 1:36:15 PM By: [name], BSN, RN, CWS, Kim RN, BSN Entered By: [name], BSN, RN, CWS, [name] on 02/16/2021 14:46:54 [name] ([id number]) -------------------------------------------------------------------------------- Pain Assessment Details Patient Name: Brian Hams A. Date of Service: 02/16/2021 2:00 PM Medical Record Number: [id number] Patient Account Number: [id number] Date of Birth/Sex: October 03, 1990 (29 y.o. M) Treating RN: [name] Primary Care Baila Rouse: [name] Other Clinician: Referring Josimar Corning: [name] Treating [place] Gopal/Extender: [name] in Treatment: 12 Active Problems Location of Pain Severity and Description of Pain Patient Has Paino No Site Locations Rate the pain. Current Pain Level: 0 Pain Management and Medication Current Pain Management: Electronic Signature(s) Signed: 02/16/2021 3:28:20 PM By: [name] Entered By: [name] on 02/16/2021 14:03:56 [name] ([id number]) -------------------------------------------------------------------------------- Patient/Caregiver Education Details Patient Name: Brian Hams A. Date of Service: 02/16/2021 2:00 PM Medical Record Number: [id number] Patient Account Number: [id number] Date of Birth/Gender: Jan 01, 1991 (29 y.o. M) Treating RN: [name] Primary Care Physician: [name] Other Clinician: Referring Physician: Lilyan Lee Treating Physician/Extender: [name] in Treatment: 12 Education Assessment Education Provided To: Patient Education Topics Provided Wound  Debridement: Handouts: Wound Debridement Methods: Demonstration, Explain/Verbal Responses: State content correctly Electronic Signature(s) Signed: 03/07/2021 1:36:15 PM By: [name], BSN, RN, CWS, Kim RN, BSN Entered By: [name], BSN, RN, CWS, [name] on 02/16/2021 14:49:50 [name] ([id number]) -------------------------------------------------------------------------------- Wound Assessment Details Patient Name: Brian Hams A. Date of Service: 02/16/2021 2:00 PM Medical Record Number: [id number] Patient Account Number: [id number] Date of Birth/Sex: 05/11/1991 (29 y.o. M) Treating RN: [name] Primary Care Cato Liburd: [name] Other Clinician: Referring Joannah Gitlin: [name] Treating Tashauna Caisse/Extender: [name] in Treatment: 12 Wound Status Wound Number: 1 Primary Etiology: Trauma, Other Wound Location: Left, Medial Foot Wound Status: Open Wounding Event: Surgical Injury Comorbid History: Neuropathy Date Acquired: 06/04/2020 Weeks Of Treatment: 12 Clustered Wound: No Wound Measurements Length: (cm) 0.4 Width: (cm) 0.3 Depth: (cm) 0.4 Area: (cm) 0.094 Volume: (cm) 0.038 % Reduction in Area: 95.6% % Reduction in Volume: 95.6% Wound Description Classification: Full Thickness Without Exposed Support Structu Exudate Amount: Medium Exudate Type: Serosanguineous Exudate Color: red, brown res Foul Odor After Cleansing: No Slough/Fibrino Yes Wound Bed Granulation Amount: Small (1-33%) Exposed Structure Granulation Quality: Pink Fascia Exposed: No Necrotic Amount: Large (67-100%) Fat Layer (Subcutaneous Tissue) Exposed: Yes Necrotic Quality:  Adherent Slough Tendon Exposed: No Muscle Exposed: No Joint Exposed: No Bone Exposed: No Electronic Signature(s) Signed: 02/16/2021 3:28:20 PM By: [name] Entered By: [name] on 02/16/2021 14:07:26 [name]  ([id number]) -------------------------------------------------------------------------------- Wound Assessment Details Patient Name: Brian Hams A. Date of Service: 02/16/2021 2:00 PM Medical Record Number: [id number] Patient Account Number: [id number] Date of Birth/Sex: 05-13-1991 (29 y.o. M) Treating RN: [name] Primary Care Katharina Jehle: [name] Other Clinician: Referring Dorinda Stehr: [name] Treating Iris Hairston/Extender: [name] in Treatment: 12 Wound Status Wound Number: 5 Primary Etiology: Open Surgical Wound Wound Location: Left, Distal Flank Wound Status: Open Wounding Event: Surgical Injury Comorbid History: Neuropathy Date Acquired: 12/31/2020 Weeks Of Treatment: 3 Clustered Wound: No Photos Wound Measurements Length: (cm) 0.1 Width: (cm) 0.2 Depth: (cm) 0.1 Area: (cm) 0.016 Volume: (cm) 0.002 % Reduction in Area: 99.7% % Reduction in Volume: 99.6% Epithelialization: Small (1-33%) Tunneling: No Undermining: No Wound Description Classification: Full Thickness Without Exposed Support Structu Exudate Amount: Small Exudate Type: Serosanguineous Exudate Color: red, brown res Foul Odor After Cleansing: No Slough/Fibrino Yes Wound Bed Granulation Amount: Large (67-100%) Exposed Structure Granulation Quality: Red, Pink Fascia Exposed: No Necrotic Amount: Small (1-33%) Fat Layer (Subcutaneous Tissue) Exposed: Yes Necrotic Quality: Eschar Tendon Exposed: No Muscle Exposed: No Joint Exposed: No Bone Exposed: No Electronic Signature(s) Signed: 02/16/2021 3:28:20 PM By: [name] Entered By: [name] on 02/16/2021 14:05:00 [name] ([id number]) -------------------------------------------------------------------------------- Vitals Details Patient Name: Brian Hams A. Date of Service: 02/16/2021 2:00 PM Medical Record Number: [id number] Patient Account Number: [id number] Date of Birth/Sex: [birth year] (29 y.o. M) Treating RN:  [name] Primary Care Alisabeth Selkirk: [name] Other Clinician: Referring Gabriel Conry: [name] Treating Celina Shiley/Extender: [name] in Treatment: 12 Vital Signs Time Taken: 14:05 Temperature (F): 98.6 Height (in): 72 Pulse (bpm): 66 Weight (lbs): 185 Respiratory Rate (breaths/min): 16 Body Mass Index (BMI): 25.1 Blood Pressure (mmHg): 123/79 Reference Range: 80 - 120 mg / dl Electronic Signature(s) Signed: 02/16/2021 3:28:20 PM By: [name] Entered ByHansel Lee on 02/16/2021 14:03:42

## 2021-03-07 NOTE — Progress Notes (Signed)
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 02/16/2021 14:56:50 [name] ([id number]) -------------------------------------------------------------------------------- ROS/PFSH Details Patient Name: Brian Hams A. Date of Service: 02/16/2021 2:00 PM Medical Record Number: [id number] Patient Account Number: [id number] Date of Birth/Sex: 02-05-1991 (30 y.o. M) Treating RN: Huel Coventry Primary Care Provider: Lilyan Punt Other Clinician: Referring Provider: Lilyan Punt Treating Provider/Extender: Tilda Franco in Treatment: 12 Information Obtained From Patient Endocrine Medical History: Negative for: Type I Diabetes; Type II Diabetes Neurologic Medical History: Positive for: Neuropathy Immunizations Pneumococcal Vaccine: Received Pneumococcal Vaccination: No Implantable Devices None Hospitalization / Surgery History Type of Hospitalization/Surgery January 22, 2020 Family and Social History Former smoker - tobacco; Marital Status - Single; Alcohol Use: Rarely; Drug Use: Prior History; Caffeine Use:  Daily Electronic Signature(s) Signed: 02/16/2021 2:58:35 PM By: Geralyn Corwin DO Signed: 03/07/2021 1:36:15 PM By: Elliot Gurney, BSN, RN, CWS, Kim RN, BSN Entered By: Geralyn Corwin on 02/16/2021 14:53:49 [name] ([id number]) -------------------------------------------------------------------------------- SuperBill Details Patient Name: Brian Hams A. Date of Service: 02/16/2021 Medical Record Number: [id number] Patient Account Number: [id number] Date of Birth/Sex: June 06, 1991 (30 y.o. 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 Facility Procedures CPT4 Code: 41324401 Description: 11042 - DEB SUBQ TISSUE 20 SQ CM/< Modifier: Quantity: 1 CPT4 Code: Description: ICD-10 Diagnosis Description L97.528 Non-pressure chronic ulcer of other part of left foot with other specified 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

## 2021-03-08 ENCOUNTER — Other Ambulatory Visit (HOSPITAL_COMMUNITY): Payer: Self-pay

## 2021-03-08 ENCOUNTER — Encounter (HOSPITAL_COMMUNITY): Payer: 59 | Admitting: Physical Therapy

## 2021-03-08 DIAGNOSIS — S82252A Displaced comminuted fracture of shaft of left tibia, initial encounter for closed fracture: Secondary | ICD-10-CM | POA: Diagnosis not present

## 2021-03-08 DIAGNOSIS — S82252N Displaced comminuted fracture of shaft of left tibia, subsequent encounter for open fracture type IIIA, IIIB, or IIIC with nonunion: Secondary | ICD-10-CM | POA: Diagnosis not present

## 2021-03-08 DIAGNOSIS — S82442K Displaced spiral fracture of shaft of left fibula, subsequent encounter for closed fracture with nonunion: Secondary | ICD-10-CM | POA: Diagnosis not present

## 2021-03-08 MED ORDER — TRAMADOL HCL 50 MG PO TABS
50.0000 mg | ORAL_TABLET | Freq: Two times a day (BID) | ORAL | 1 refills | Status: DC | PRN
  Filled 2021-03-08: qty 40, 20d supply, fill #0

## 2021-03-09 ENCOUNTER — Encounter (HOSPITAL_BASED_OUTPATIENT_CLINIC_OR_DEPARTMENT_OTHER): Payer: 59 | Admitting: Internal Medicine

## 2021-03-09 ENCOUNTER — Encounter (HOSPITAL_COMMUNITY): Payer: 59 | Admitting: Occupational Therapy

## 2021-03-09 ENCOUNTER — Other Ambulatory Visit: Payer: Self-pay

## 2021-03-09 DIAGNOSIS — Z87891 Personal history of nicotine dependence: Secondary | ICD-10-CM | POA: Diagnosis not present

## 2021-03-09 DIAGNOSIS — L97528 Non-pressure chronic ulcer of other part of left foot with other specified severity: Secondary | ICD-10-CM | POA: Diagnosis not present

## 2021-03-09 DIAGNOSIS — L97521 Non-pressure chronic ulcer of other part of left foot limited to breakdown of skin: Secondary | ICD-10-CM | POA: Diagnosis not present

## 2021-03-09 DIAGNOSIS — Z89432 Acquired absence of left foot: Secondary | ICD-10-CM | POA: Diagnosis not present

## 2021-03-09 DIAGNOSIS — T8131XD Disruption of external operation (surgical) wound, not elsewhere classified, subsequent encounter: Secondary | ICD-10-CM | POA: Diagnosis not present

## 2021-03-09 DIAGNOSIS — L98428 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: Secondary | ICD-10-CM | POA: Diagnosis not present

## 2021-03-10 ENCOUNTER — Other Ambulatory Visit (HOSPITAL_COMMUNITY): Payer: Self-pay

## 2021-03-10 ENCOUNTER — Encounter (HOSPITAL_COMMUNITY): Payer: 59 | Admitting: Physical Therapy

## 2021-03-10 ENCOUNTER — Ambulatory Visit (HOSPITAL_COMMUNITY): Payer: 59 | Admitting: Physical Therapy

## 2021-03-10 DIAGNOSIS — M25572 Pain in left ankle and joints of left foot: Secondary | ICD-10-CM | POA: Diagnosis not present

## 2021-03-10 DIAGNOSIS — S91302A Unspecified open wound, left foot, initial encounter: Secondary | ICD-10-CM | POA: Diagnosis not present

## 2021-03-10 DIAGNOSIS — I82412 Acute embolism and thrombosis of left femoral vein: Secondary | ICD-10-CM | POA: Diagnosis not present

## 2021-03-10 DIAGNOSIS — F17210 Nicotine dependence, cigarettes, uncomplicated: Secondary | ICD-10-CM | POA: Diagnosis not present

## 2021-03-10 DIAGNOSIS — M79672 Pain in left foot: Secondary | ICD-10-CM | POA: Diagnosis not present

## 2021-03-10 DIAGNOSIS — R262 Difficulty in walking, not elsewhere classified: Secondary | ICD-10-CM

## 2021-03-10 DIAGNOSIS — I2699 Other pulmonary embolism without acute cor pulmonale: Secondary | ICD-10-CM | POA: Diagnosis not present

## 2021-03-10 DIAGNOSIS — Z86718 Personal history of other venous thrombosis and embolism: Secondary | ICD-10-CM | POA: Diagnosis not present

## 2021-03-10 DIAGNOSIS — M6281 Muscle weakness (generalized): Secondary | ICD-10-CM

## 2021-03-10 DIAGNOSIS — R269 Unspecified abnormalities of gait and mobility: Secondary | ICD-10-CM | POA: Diagnosis not present

## 2021-03-10 DIAGNOSIS — Z7289 Other problems related to lifestyle: Secondary | ICD-10-CM | POA: Diagnosis not present

## 2021-03-10 DIAGNOSIS — M7989 Other specified soft tissue disorders: Secondary | ICD-10-CM | POA: Diagnosis not present

## 2021-03-10 NOTE — Progress Notes (Addendum)
USHER, HEDBERG ([id number]) Visit Report for 03/09/2021 Arrival Information Details Patient Name: [name], [name]. Date of Service: 03/09/2021 2:00 PM Medical Record Number: [id number] Patient Account Number: [id number] Date of Birth/Sex: 15-Dec-1990 (30 y.o. M) Treating RN: Hansel Feinstein Primary Care Christoph Copelan: Lilyan Punt Other Clinician: Referring Dontreal Miera: Lilyan Punt Treating Denice Cardon/Extender: Tilda Franco in Treatment: 15 Visit Information History Since Last Visit Added or deleted any medications: No Patient Arrived: Crutches Had a fall or experienced change in No Arrival Time: 14:15 activities of daily living that may affect Accompanied By: mother risk of falls: Transfer Assistance: None Hospitalized since last visit: No Patient Identification Verified: Yes Has Dressing in Place as Prescribed: Yes Secondary Verification Process Completed: Yes Pain Present Now: Yes Patient Has Alerts: Yes Patient Alerts: Patient on Blood Thinner Eliquis NOT DIABETIC Electronic Signature(s) Signed: 03/10/2021 10:03:03 AM By: Hansel Feinstein Entered By: Hansel Feinstein on 03/09/2021 14:17:56 [name] ([id number]) -------------------------------------------------------------------------------- Clinic Level of Care Assessment Details Patient Name: [name]. Date of Service: 03/09/2021 2:00 PM Medical Record Number: [id number] Patient Account Number: [id number] Date of Birth/Sex: March 20, 1991 (30 y.o. M) Treating RN: Hansel Feinstein Primary Care Devansh Riese: Lilyan Punt Other Clinician: Referring Justice Aguirre: Lilyan Punt Treating Youlanda Tomassetti/Extender: Tilda Franco in Treatment: 15 Clinic Level of Care Assessment Items TOOL 4 Quantity Score []  - Use when only an EandM is performed on FOLLOW-UP visit 0 ASSESSMENTS - Nursing Assessment / Reassessment []  - Reassessment of Co-morbidities (includes updates in patient status) 0 []  - 0 Reassessment of Adherence to Treatment  Plan ASSESSMENTS - Wound and Skin Assessment / Reassessment X - Simple Wound Assessment / Reassessment - one wound 1 5 []  - 0 Complex Wound Assessment / Reassessment - multiple wounds []  - 0 Dermatologic / Skin Assessment (not related to wound area) ASSESSMENTS - Focused Assessment []  - Circumferential Edema Measurements - multi extremities 0 []  - 0 Nutritional Assessment / Counseling / Intervention []  - 0 Lower Extremity Assessment (monofilament, tuning fork, pulses) []  - 0 Peripheral Arterial Disease Assessment (using hand held doppler) ASSESSMENTS - Ostomy and/or Continence Assessment and Care []  - Incontinence Assessment and Management 0 []  - 0 Ostomy Care Assessment and Management (repouching, etc.) PROCESS - Coordination of Care X - Simple Patient / Family Education for ongoing care 1 15 []  - 0 Complex (extensive) Patient / Family Education for ongoing care []  - 0 Staff obtains , Records, Test Results / Process Orders []  - 0 Staff telephones HHA, Nursing Homes / Clarify orders / etc []  - 0 Routine Transfer to another Facility (non-emergent condition) []  - 0 Routine Hospital Admission (non-emergent condition) []  - 0 New Admissions / / Ordering NPWT, Apligraf, etc. []  - 0 Emergency Hospital Admission (emergent condition) X- 1 10 Simple Discharge Coordination []  - 0 Complex (extensive) Discharge Coordination PROCESS - Special Needs []  - Pediatric / Minor Patient Management 0 []  - 0 Isolation Patient Management []  - 0 Hearing / Language / Visual special needs []  - 0 Assessment of Community assistance (transportation, D/C planning, etc.) []  - 0 Additional assistance / Altered mentation []  - 0 Support Surface(s) Assessment (bed, cushion, seat, etc.) INTERVENTIONS - Wound Cleansing / Measurement Marzan, Javaris A. ( ) X- 1 5 Simple Wound Cleansing - one wound []  - 0 Complex Wound Cleansing - multiple wounds []  - 0 Wound  Imaging (photographs - any number of wounds) []  - 0 Wound Tracing (instead of photographs) X- 1 5 Simple Wound Measurement - one wound []  - 0  Complex Wound Measurement - multiple wounds INTERVENTIONS - Wound Dressings X - Small Wound Dressing one or multiple wounds 1 10 []  - 0 Medium Wound Dressing one or multiple wounds []  - 0 Large Wound Dressing one or multiple wounds []  - 0 Application of Medications - topical []  - 0 Application of Medications - injection INTERVENTIONS - Miscellaneous []  - External ear exam 0 []  - 0 Specimen Collection (cultures, biopsies, blood, body fluids, etc.) []  - 0 Specimen(s) / Culture(s) sent or taken to Lab for analysis []  - 0 Patient Transfer (multiple staff / / Similar devices) []  - 0 Simple Staple / Suture removal (25 or less) []  - 0 Complex Staple / Suture removal (26 or more) []  - 0 Hypo / Hyperglycemic Management (close monitor of Blood Glucose) []  - 0 Ankle / Brachial Index (ABI) - do not check if billed separately X- 1 5 Vital Signs Has the patient been seen at the hospital within the last three years: Yes Total Score: 55 Level Of Care: New/Established - Level 2 Electronic Signature(s) Signed: 03/10/2021 10:03:03 AM By: Entered By: on 03/09/2021 14:35:37 ( ) -------------------------------------------------------------------------------- Complex / Palliative Patient Assessment Details Patient Name: A. Date of Service: 03/09/2021 2:00 PM Medical Record Number: Patient Account Number: Date of Birth/Sex: 1990-12-24 (30 y.o. M) Treating RN: 03/12/2021 Primary Care Jacolby Risby: Hansel Feinstein Other Clinician: Referring Eimi Viney: Hansel Feinstein Treating Gustin Zobrist/Extender: 03/11/2021 in Treatment: 15 Palliative Management Criteria Complex Wound Management Criteria Patient has remarkable or complex co-morbidities requiring  medications or treatments that extend wound healing times. Examples: o Diabetes mellitus with chronic renal failure or end stage renal disease requiring dialysis o Advanced or poorly controlled rheumatoid arthritis o Diabetes mellitus and end stage chronic obstructive pulmonary disease o Active cancer with current chemo- or radiation therapy MVA with partial foot amputation, artery damage Care Approach Wound Care Plan: Complex Wound Management Electronic Signature(s) Signed: 03/14/2021 11:05:12 AM By: [id number] Signed: 03/23/2021 4:42:29 PM By: 03/11/2021 DO Previous Signature: 03/14/2021 11:05:06 AM Version By: [id number] Entered By: 06/25/1991 on 03/14/2021 11:05:11 Hansel Feinstein (Lilyan Punt) -------------------------------------------------------------------------------- Encounter Discharge Information Details Patient Name: Lilyan Punt A. Date of Service: 03/09/2021 2:00 PM Medical Record Number: 03/16/2021 Patient Account Number: Hansel Feinstein Date of Birth/Sex: 1991-03-22 (30 y.o. M) Treating RN: 03/16/2021 Primary Care Itzamara Casas: Hansel Feinstein Other Clinician: Referring Haru Shaff: Hansel Feinstein Treating Dorsey Charette/Extender: 03/16/2021 in Treatment: 15 Encounter Discharge Information Items Discharge Condition: Stable Ambulatory Status: Crutches Discharge Destination: Home Transportation: Private Auto Accompanied By: mother Schedule Follow-up Appointment: Yes Clinical Summary of Care: Electronic Signature(s) Signed: 03/10/2021 10:03:03 AM By: [id number] Entered By: [name] on 03/09/2021 14:48:23 [id number] ([id number]) -------------------------------------------------------------------------------- Lower Extremity Assessment Details Patient Name: 06/25/1991 A. Date of Service: 03/09/2021 2:00 PM Medical Record Number: Hansel Feinstein Patient Account Number: Lilyan Punt Date of Birth/Sex: [birth year] (30 y.o. M) Treating RN: 03/12/2021 Primary  Care Nisreen Guise: Hansel Feinstein Other Clinician: Referring Amelya Mabry: Hansel Feinstein Treating Remell Giaimo/Extender: 03/11/2021 in Treatment: 15 Edema Assessment Assessed: [Left: Yes] [Right: No] [Left: Edema] [Right: :] Electronic Signature(s) Signed: 03/10/2021 10:03:03 AM By: [id number] Entered By: [name] on 03/09/2021 14:28:11 [id number] ([id number]) -------------------------------------------------------------------------------- Multi Wound Chart Details Patient Name: 06/25/1991 A. Date of Service: 03/09/2021 2:00 PM Medical Record Number: Hansel Feinstein Patient Account Number: Lilyan Punt Date of Birth/Sex: August 05, 1991 (30 y.o. M) Treating RN: 03/12/2021 Primary Care Yaritzy Huser: Hansel Feinstein  Other Clinician: Referring Jessiah Steinhart: Lilyan PuntLuking, Scott Treating Kiril Hippe/Extender: Tilda FrancoHoffman, Jessica Weeks in Treatment: 15 Vital Signs Height(in): 72 Pulse(bpm): 76 Weight(lbs): 185 Blood Pressure(mmHg): 125/79 Body Mass Index(BMI): 25 Temperature(F): 98 Respiratory Rate(breaths/min): 16 Photos: [N/A:N/A] Wound Location: Left, Medial Foot N/A N/A Wounding Event: Surgical Injury N/A N/A Primary Etiology: Trauma, Other N/A N/A Comorbid History: Neuropathy N/A N/A Date Acquired: 06/04/2020 N/A N/A Weeks of Treatment: 15 N/A N/A Wound Status: Open N/A N/A Measurements L x W x D (cm) 0.5x0.5x0.4 N/A N/A Area (cm) : 0.196 N/A N/A Volume (cm) : 0.079 N/A N/A % Reduction in Area: 90.80% N/A N/A % Reduction in Volume: 90.80% N/A N/A Classification: Full Thickness Without Exposed N/A N/A Support Structures Exudate Amount: Medium N/A N/A Exudate Type: Serosanguineous N/A N/A Exudate Color: red, brown N/A N/A Granulation Amount: Large (67-100%) N/A N/A Granulation Quality: Pink N/A N/A Necrotic Amount: Small (1-33%) N/A N/A Exposed Structures: Fat Layer (Subcutaneous Tissue): N/A N/A Yes Fascia: No Tendon: No Muscle: No Joint: No Bone: No Treatment Notes Wound #1  (Foot) Wound Laterality: Left, Medial Cleanser Soap and Water Discharge Instruction: Gently cleanse wound with antibacterial soap, rinse and pat dry prior to dressing wounds Peri-Wound Care Topical Primary Dressing [name], Colson A. ([id number]) Prisma 4.34 (in) Discharge Instruction: Moisten with KY jelly/hydrogel; Cover wound as directed. Do not remove from wound bed. Secondary Dressing ABD Pad 5x9 (in/in) Discharge Instruction: Cover with ABD pad Secured With 68M Medipore H Soft Cloth Surgical Tape, 2x2 (in/yd) Kerlix Roll Sterile or Non-Sterile 6-ply 4.5x4 (yd/yd) Discharge Instruction: Apply Kerlix as directed Tubigrip Size D, 3x10 (in/yd) Discharge Instruction: Use on top for light compression Compression Wrap Compression Stockings Add-Ons Electronic Signature(s) Signed: 03/09/2021 3:05:11 PM By: Geralyn CorwinHoffman, Jessica DO Entered By: Geralyn CorwinHoffman, Jessica on 03/09/2021 14:56:21 Verna CzechWALKER, Jadrien A. ([id number]) -------------------------------------------------------------------------------- Multi-Disciplinary Care Plan Details Patient Name: [name], Cheo A. Date of Service: 03/09/2021 2:00 PM Medical Record Number: [id number] Patient Account Number: [id number] Date of Birth/Sex: 01/09/1991 (30 y.o. M) Treating RN: Hansel FeinsteinBishop, Joy Primary Care Kaiyon Hynes: Lilyan PuntLuking, Scott Other Clinician: Referring Charmain Diosdado: Lilyan PuntLuking, Scott Treating Lessie Funderburke/Extender: Tilda FrancoHoffman, Jessica Weeks in Treatment: 15 Active Inactive Electronic Signature(s) Signed: 03/10/2021 10:03:03 AM By: Hansel FeinsteinBishop, Joy Entered By: Hansel FeinsteinBishop, Joy on 03/09/2021 14:30:20 Verna CzechWALKER, Caydin A. ([id number]) -------------------------------------------------------------------------------- Pain Assessment Details Patient Name: [name], Dhilan A. Date of Service: 03/09/2021 2:00 PM Medical Record Number: [id number] Patient Account Number: [id number] Date of Birth/Sex: 01/29/1991 (30 y.o. M) Treating RN: Hansel FeinsteinBishop, Joy Primary Care Nahal Wanless: Lilyan PuntLuking, Scott Other  Clinician: Referring Shalice Woodring: Lilyan PuntLuking, Scott Treating Ameyah Bangura/Extender: Tilda FrancoHoffman, Jessica Weeks in Treatment: 15 Active Problems Location of Pain Severity and Description of Pain Patient Has Paino Yes Site Locations Pain Location: Pain in Ulcers Rate the pain. Current Pain Level: 3 Pain Management and Medication Current Pain Management: Electronic Signature(s) Signed: 03/10/2021 10:03:03 AM By: Hansel FeinsteinBishop, Joy Entered By: Hansel FeinsteinBishop, Joy on 03/09/2021 14:21:34 Verna CzechWALKER, Zayvian A. ([id number]) -------------------------------------------------------------------------------- Patient/Caregiver Education Details Patient Name: [name], Fuquan A. Date of Service: 03/09/2021 2:00 PM Medical Record Number: [id number] Patient Account Number: [id number] Date of Birth/Gender: 02/05/1991 (30 y.o. M) Treating RN: Hansel FeinsteinBishop, Joy Primary Care Physician: Lilyan PuntLuking, Scott Other Clinician: Referring Physician: Lilyan PuntLuking, Scott Treating Physician/Extender: Tilda FrancoHoffman, Jessica Weeks in Treatment: 15 Education Assessment Education Provided To: Patient Education Topics Provided Basic Hygiene: Pressure: Wound/Skin Impairment: Electronic Signature(s) Signed: 03/10/2021 10:03:03 AM By: Hansel FeinsteinBishop, Joy Entered By: Hansel FeinsteinBishop, Joy on 03/09/2021 14:35:57 Verna CzechWALKER, Rebel A. ([id number]) -------------------------------------------------------------------------------- Wound Assessment Details Patient Name: [name], Gavyn A. Date of Service: 03/09/2021 2:00 PM Medical Record Number: [id number] Patient Account  Number: [id number] Date of Birth/Sex: 11/16/1990 (30 y.o. M) Treating RN: Hansel Feinstein Primary Care Vicenta Olds: Lilyan Punt Other Clinician: Referring Bensen Chadderdon: Lilyan Punt Treating Kenza Munar/Extender: Tilda Franco in Treatment: 15 Wound Status Wound Number: 1 Primary Etiology: Trauma, Other Wound Location: Left, Medial Foot Wound Status: Open Wounding Event: Surgical Injury Comorbid History: Neuropathy Date Acquired:  06/04/2020 Weeks Of Treatment: 15 Clustered Wound: No Photos Wound Measurements Length: (cm) 0.5 Width: (cm) 0.5 Depth: (cm) 0.4 Area: (cm) 0.196 Volume: (cm) 0.079 % Reduction in Area: 90.8% % Reduction in Volume: 90.8% Tunneling: No Undermining: No Wound Description Classification: Full Thickness Without Exposed Support Structu Exudate Amount: Medium Exudate Type: Serosanguineous Exudate Color: red, brown res Foul Odor After Cleansing: No Slough/Fibrino Yes Wound Bed Granulation Amount: Large (67-100%) Exposed Structure Granulation Quality: Pink Fascia Exposed: No Necrotic Amount: Small (1-33%) Fat Layer (Subcutaneous Tissue) Exposed: Yes Necrotic Quality: Adherent Slough Tendon Exposed: No Muscle Exposed: No Joint Exposed: No Bone Exposed: No Electronic Signature(s) Signed: 03/10/2021 10:03:03 AM By: Hansel Feinstein Entered By: Hansel Feinstein on 03/09/2021 14:27:45 [name] ([id number]) -------------------------------------------------------------------------------- Vitals Details Patient Name: [name] Date of Service: 03/09/2021 2:00 PM Medical Record Number: [id number] Patient Account Number: [id number] Date of Birth/Sex: 1991-02-11 (30 y.o. M) Treating RN: Hansel Feinstein Primary Care Shelvy Heckert: Lilyan Punt Other Clinician: Referring Evora Schechter: Lilyan Punt Treating Jammie Troup/Extender: Tilda Franco in Treatment: 15 Vital Signs Time Taken: 14:20 Temperature (F): 98 Height (in): 72 Pulse (bpm): 76 Weight (lbs): 185 Respiratory Rate (breaths/min): 16 Body Mass Index (BMI): 25.1 Blood Pressure (mmHg): 125/79 Reference Range: 80 - 120 mg / dl Electronic Signature(s) Signed: 03/10/2021 10:03:03 AM By: Hansel Feinstein Entered ByHansel Feinstein on 03/09/2021 14:21:25
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
## 2021-03-15 ENCOUNTER — Ambulatory Visit (HOSPITAL_COMMUNITY): Payer: 59 | Admitting: Physical Therapy

## 2021-03-15 ENCOUNTER — Encounter (HOSPITAL_COMMUNITY): Payer: Self-pay | Admitting: Physical Therapy

## 2021-03-15 ENCOUNTER — Encounter (HOSPITAL_COMMUNITY): Payer: 59 | Admitting: Physical Therapy

## 2021-03-15 ENCOUNTER — Other Ambulatory Visit: Payer: Self-pay

## 2021-03-15 DIAGNOSIS — M6281 Muscle weakness (generalized): Secondary | ICD-10-CM

## 2021-03-15 DIAGNOSIS — F17210 Nicotine dependence, cigarettes, uncomplicated: Secondary | ICD-10-CM | POA: Diagnosis not present

## 2021-03-15 DIAGNOSIS — M25572 Pain in left ankle and joints of left foot: Secondary | ICD-10-CM | POA: Diagnosis not present

## 2021-03-15 DIAGNOSIS — Z7289 Other problems related to lifestyle: Secondary | ICD-10-CM | POA: Diagnosis not present

## 2021-03-15 DIAGNOSIS — R269 Unspecified abnormalities of gait and mobility: Secondary | ICD-10-CM | POA: Diagnosis not present

## 2021-03-15 DIAGNOSIS — M79672 Pain in left foot: Secondary | ICD-10-CM | POA: Diagnosis not present

## 2021-03-15 DIAGNOSIS — Z86718 Personal history of other venous thrombosis and embolism: Secondary | ICD-10-CM | POA: Diagnosis not present

## 2021-03-15 DIAGNOSIS — R262 Difficulty in walking, not elsewhere classified: Secondary | ICD-10-CM

## 2021-03-15 DIAGNOSIS — M7989 Other specified soft tissue disorders: Secondary | ICD-10-CM | POA: Diagnosis not present

## 2021-03-15 DIAGNOSIS — I82412 Acute embolism and thrombosis of left femoral vein: Secondary | ICD-10-CM | POA: Diagnosis not present

## 2021-03-15 DIAGNOSIS — I2699 Other pulmonary embolism without acute cor pulmonale: Secondary | ICD-10-CM | POA: Diagnosis not present
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
## 2021-03-16 ENCOUNTER — Encounter (HOSPITAL_BASED_OUTPATIENT_CLINIC_OR_DEPARTMENT_OTHER): Payer: 59 | Admitting: Internal Medicine

## 2021-03-16 DIAGNOSIS — L97528 Non-pressure chronic ulcer of other part of left foot with other specified severity: Secondary | ICD-10-CM | POA: Diagnosis not present

## 2021-03-16 DIAGNOSIS — L97521 Non-pressure chronic ulcer of other part of left foot limited to breakdown of skin: Secondary | ICD-10-CM | POA: Diagnosis not present

## 2021-03-16 DIAGNOSIS — T8131XD Disruption of external operation (surgical) wound, not elsewhere classified, subsequent encounter: Secondary | ICD-10-CM

## 2021-03-16 DIAGNOSIS — Z87891 Personal history of nicotine dependence: Secondary | ICD-10-CM | POA: Diagnosis not present

## 2021-03-16 DIAGNOSIS — L98428 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: Secondary | ICD-10-CM | POA: Diagnosis not present

## 2021-03-16 DIAGNOSIS — Z89432 Acquired absence of left foot: Secondary | ICD-10-CM | POA: Diagnosis not present

## 2021-03-16 NOTE — Progress Notes (Signed)
[name] ([id number]) Visit Report for 03/16/2021 Arrival Information Details Patient Name: [name], [name]. Date of Service: 03/16/2021 2:00 PM Medical Record Number: [id number] Patient Account Number: [id number] Date of Birth/Sex: Aug 31, 1990 (30 y.o. M) Treating RN: [name] Primary Care [name]: [name] Other Clinician: Referring [name]: [name] Treating Damyia Strider/Extender: [name] in Treatment: 16 Visit Information History Since Last Visit Added or deleted any medications: No Patient Arrived: Crutches Had a fall or experienced change in No Arrival Time: 13:39 activities of daily living that may affect Accompanied By: mother risk of falls: Transfer Assistance: None Hospitalized since last visit: No Patient Identification Verified: Yes Has Dressing in Place as Prescribed: Yes Secondary Verification Process Completed: Yes Pain Present Now: Yes Patient Has Alerts: Yes Patient Alerts: Patient on Blood Thinner Eliquis NOT DIABETIC Electronic Signature(s) Signed: 03/16/2021 2:38:55 PM By: [name] Entered By: [name] on 03/16/2021 13:39:57 [name] ([id number]) -------------------------------------------------------------------------------- Clinic Level of Care Assessment Details Patient Name: [name]. Date of Service: 03/16/2021 2:00 PM Medical Record Number: [id number] Patient Account Number: [id number] Date of Birth/Sex: [birth year] (30 y.o. M) Treating RN: [name] Primary Care [name]: [name] Other Clinician: Referring [name]: [name] Treating Zeynab Klett/Extender: [name] in Treatment: 16 Clinic Level of Care Assessment Items TOOL 4 Quantity Score []  - Use when only an EandM is performed on FOLLOW-UP visit 0 ASSESSMENTS - Nursing Assessment / Reassessment []  - Reassessment of Co-morbidities (includes updates in patient status) 0 []  - 0 Reassessment of Adherence to Treatment  Plan ASSESSMENTS - Wound and Skin Assessment / Reassessment X - Simple Wound Assessment / Reassessment - one wound 1 5 []  - 0 Complex Wound Assessment / Reassessment - multiple wounds []  - 0 Dermatologic / Skin Assessment (not related to wound area) ASSESSMENTS - Focused Assessment []  - Circumferential Edema Measurements - multi extremities 0 []  - 0 Nutritional Assessment / Counseling / Intervention []  - 0 Lower Extremity Assessment (monofilament, tuning fork, pulses) []  - 0 Peripheral Arterial Disease Assessment (using hand held doppler) ASSESSMENTS - Ostomy and/or Continence Assessment and Care []  - Incontinence Assessment and Management 0 []  - 0 Ostomy Care Assessment and Management (repouching, etc.) PROCESS - Coordination of Care X - Simple Patient / Family Education for ongoing care 1 15 []  - 0 Complex (extensive) Patient / Family Education for ongoing care []  - 0 Staff obtains , Records, Test Results / Process Orders []  - 0 Staff telephones HHA, Nursing Homes / Clarify orders / etc []  - 0 Routine Transfer to another Facility (non-emergent condition) []  - 0 Routine Hospital Admission (non-emergent condition) []  - 0 New Admissions / / Ordering NPWT, Apligraf, etc. []  - 0 Emergency Hospital Admission (emergent condition) X- 1 10 Simple Discharge Coordination []  - 0 Complex (extensive) Discharge Coordination PROCESS - Special Needs []  - Pediatric / Minor Patient Management 0 []  - 0 Isolation Patient Management []  - 0 Hearing / Language / Visual special needs []  - 0 Assessment of Community assistance (transportation, D/C planning, etc.) []  - 0 Additional assistance / Altered mentation []  - 0 Support Surface(s) Assessment (bed, cushion, seat, etc.) INTERVENTIONS - Wound Cleansing / Measurement Dade, [name] ( ) X- 1 5 Simple Wound Cleansing - one wound []  - 0 Complex Wound Cleansing - multiple wounds []  - 0 Wound  Imaging (photographs - any number of wounds) []  - 0 Wound Tracing (instead of photographs) X- 1 5 Simple Wound Measurement - one wound []  - 0  Complex Wound Measurement - multiple wounds INTERVENTIONS - Wound Dressings X - Small Wound Dressing one or multiple wounds 1 10 []  - 0 Medium Wound Dressing one or multiple wounds []  - 0 Large Wound Dressing one or multiple wounds []  - 0 Application of Medications - topical []  - 0 Application of Medications - injection INTERVENTIONS - Miscellaneous []  - External ear exam 0 []  - 0 Specimen Collection (cultures, biopsies, blood, body fluids, etc.) []  - 0 Specimen(s) / Culture(s) sent or taken to Lab for analysis []  - 0 Patient Transfer (multiple staff / / Similar devices) []  - 0 Simple Staple / Suture removal (25 or less) []  - 0 Complex Staple / Suture removal (26 or more) []  - 0 Hypo / Hyperglycemic Management (close monitor of Blood Glucose) []  - 0 Ankle / Brachial Index (ABI) - do not check if billed separately X- 1 5 Vital Signs Has the patient been seen at the hospital within the last three years: Yes Total Score: 55 Level Of Care: New/Established - Level 2 Electronic Signature(s) Signed: 03/16/2021 2:38:55 PM By: Entered By: on 03/16/2021 14:00:39 ( ) -------------------------------------------------------------------------------- Encounter Discharge Information Details Patient Name: A. Date of Service: 03/16/2021 2:00 PM Medical Record Number: Patient Account Number: Date of Birth/Sex: May 30, 1991 (30 y.o. M) Treating RN: 03/18/2021 Primary Care Shajuana Mclucas: [name] Other Clinician: Referring Suleyma Wafer: [name] Treating Abou Sterkel/Extender: 03/18/2021 in Treatment: 16 Encounter Discharge Information Items Discharge Condition: Stable Ambulatory Status: Crutches Discharge Destination: Home Transportation:  Private Auto Accompanied By: mother Schedule Follow-up Appointment: Yes Clinical Summary of Care: Electronic Signature(s) Signed: 03/16/2021 2:38:55 PM By: [id number] Entered By: Brian Hams on 03/16/2021 14:14:28 [id number] ([id number]) -------------------------------------------------------------------------------- Lower Extremity Assessment Details Patient Name: 06/25/1991 A. Date of Service: 03/16/2021 2:00 PM Medical Record Number: [name] Patient Account Number: [name] Date of Birth/Sex: May 14, 1991 (29 y.o. M) Treating RN: 03/18/2021 Primary Care Misty Foutz: [name] Other Clinician: Referring Julann Mcgilvray: [name] Treating Gemayel Mascio/Extender: 03/18/2021 in Treatment: 16 Edema Assessment Assessed: [Left: Yes] [Right: No] Edema: [Left: N] [Right: o] Electronic Signature(s) Signed: 03/16/2021 2:38:55 PM By: [id number] Entered By: Brian Hams on 03/16/2021 13:48:26 [id number] ([id number]) -------------------------------------------------------------------------------- Multi Wound Chart Details Patient Name: 06/25/1991 A. Date of Service: 03/16/2021 2:00 PM Medical Record Number: [name] Patient Account Number: [name] Date of Birth/Sex: Jan 14, 1991 (29 y.o. M) Treating RN: 03/18/2021 Primary Care Makhai Fulco: [name] Other Clinician: Referring Allen Basista: [name] Treating Ardena Gangl/Extender: 03/18/2021 in Treatment: 16 Vital Signs Height(in): 72 Pulse(bpm): 74 Weight(lbs): 185 Blood Pressure(mmHg): 133/86 Body Mass Index(BMI): 25 Temperature(F): 98.4 Respiratory Rate(breaths/min): 16 Photos: [N/A:N/A] Wound Location: Left, Medial Foot N/A N/A Wounding Event: Surgical Injury N/A N/A Primary Etiology: Trauma, Other N/A N/A Comorbid History: Neuropathy N/A N/A Date Acquired: 06/04/2020 N/A N/A Weeks of Treatment: 16 N/A N/A Wound Status: Open N/A N/A Measurements L x W x D (cm) 0.4x0.3x0.2 N/A N/A Area  (cm) : 0.094 N/A N/A Volume (cm) : 0.019 N/A N/A % Reduction in Area: 95.60% N/A N/A % Reduction in Volume: 97.80% N/A N/A Classification: Full Thickness Without Exposed N/A N/A Support Structures Exudate Amount: Medium N/A N/A Exudate Type: Serosanguineous N/A N/A Exudate Color: red, brown N/A N/A Granulation Amount: Large (67-100%) N/A N/A Granulation Quality: Pink N/A N/A Necrotic Amount: Small (1-33%) N/A N/A Exposed Structures: Fat Layer (Subcutaneous Tissue): N/A N/A Yes Fascia: No Tendon: No Muscle: No Joint:  No Bone: No Epithelialization: Small (1-33%) N/A N/A Treatment Notes Electronic Signature(s) Signed: 03/16/2021 2:14:06 PM By: Geralyn CorwinHoffman, Jessica DO Entered By: Geralyn CorwinHoffman, Jessica on 03/16/2021 14:04:53 Brian CzechWALKER, [name] ([id number]) -------------------------------------------------------------------------------- Multi-Disciplinary Care Plan Details Patient Name: [name], [name] Date of Service: 03/16/2021 2:00 PM Medical Record Number: [id number] Patient Account Number: [id number] Date of Birth/Sex: 06/15/1991 (29 y.o. M) Treating RN: Brian FeinsteinBishop, Joy Primary Care Irene Mitcham: Brian PuntLuking, Scott Other Clinician: Referring Dekota Kirlin: Brian PuntLuking, Scott Treating Jawann Urbani/Extender: Brian FrancoHoffman, Jessica Weeks in Treatment: 16 Active Inactive Electronic Signature(s) Signed: 03/16/2021 2:38:55 PM By: Brian FeinsteinBishop, Joy Entered By: Brian FeinsteinBishop, Joy on 03/16/2021 13:48:43 Brian CzechWALKER, [name] ([id number]) -------------------------------------------------------------------------------- Pain Assessment Details Patient Name: [name], [name] Date of Service: 03/16/2021 2:00 PM Medical Record Number: [id number] Patient Account Number: [id number] Date of Birth/Sex: 10/01/1990 (29 y.o. M) Treating RN: Brian FeinsteinBishop, Joy Primary Care Ravon Mcilhenny: Brian PuntLuking, Scott Other Clinician: Referring Bindu Docter: Brian PuntLuking, Scott Treating Mirai Greenwood/Extender: Brian FrancoHoffman, Jessica Weeks in Treatment: 16 Active Problems Location of Pain Severity and  Description of Pain Patient Has Paino No Site Locations Rate the pain. Current Pain Level: 0 Pain Management and Medication Current Pain Management: Electronic Signature(s) Signed: 03/16/2021 2:38:55 PM By: Brian FeinsteinBishop, Joy Entered By: Brian FeinsteinBishop, Joy on 03/16/2021 13:42:40 Brian CzechWALKER, [name] ([id number]) -------------------------------------------------------------------------------- Patient/Caregiver Education Details Patient Name: [name], Tyri A. Date of Service: 03/16/2021 2:00 PM Medical Record Number: [id number] Patient Account Number: [id number] Date of Birth/Gender: 06/23/1991 (29 y.o. M) Treating RN: Brian FeinsteinBishop, Joy Primary Care Physician: Brian PuntLuking, Scott Other Clinician: Referring Physician: Lilyan PuntLuking, Scott Treating Physician/Extender: Brian FrancoHoffman, Jessica Weeks in Treatment: 16 Education Assessment Education Provided To: Patient and Caregiver Education Topics Provided Basic Hygiene: Smoking and Wound Healing: Wound/Skin Impairment: Electronic Signature(s) Signed: 03/16/2021 2:38:55 PM By: Brian FeinsteinBishop, Joy Entered By: Brian FeinsteinBishop, Joy on 03/16/2021 13:56:30 Brian CzechWALKER, [name] ([id number]) -------------------------------------------------------------------------------- Wound Assessment Details Patient Name: [name], [name] Date of Service: 03/16/2021 2:00 PM Medical Record Number: [id number] Patient Account Number: [id number] Date of Birth/Sex: 04/17/1991 (29 y.o. M) Treating RN: Brian FeinsteinBishop, Joy Primary Care Neshia Mckenzie: Brian PuntLuking, Scott Other Clinician: Referring Neelam Tiggs: Brian PuntLuking, Scott Treating Che Rachal/Extender: Brian FrancoHoffman, Jessica Weeks in Treatment: 16 Wound Status Wound Number: 1 Primary Etiology: Trauma, Other Wound Location: Left, Medial Foot Wound Status: Open Wounding Event: Surgical Injury Comorbid History: Neuropathy Date Acquired: 06/04/2020 Weeks Of Treatment: 16 Clustered Wound: No Photos Wound Measurements Length: (cm) 0.4 Width: (cm) 0.3 Depth: (cm) 0.2 Area: (cm) 0.094 Volume: (cm)  0.019 % Reduction in Area: 95.6% % Reduction in Volume: 97.8% Epithelialization: Small (1-33%) Tunneling: No Undermining: No Wound Description Classification: Full Thickness Without Exposed Support Structu Exudate Amount: Medium Exudate Type: Serosanguineous Exudate Color: red, brown res Foul Odor After Cleansing: No Slough/Fibrino Yes Wound Bed Granulation Amount: Large (67-100%) Exposed Structure Granulation Quality: Pink Fascia Exposed: No Necrotic Amount: Small (1-33%) Fat Layer (Subcutaneous Tissue) Exposed: Yes Necrotic Quality: Adherent Slough Tendon Exposed: No Muscle Exposed: No Joint Exposed: No Bone Exposed: No Treatment Notes Wound #1 (Foot) Wound Laterality: Left, Medial Cleanser Soap and Water Discharge Instruction: Gently cleanse wound with antibacterial soap, rinse and pat dry prior to dressing wounds Peri-Wound Care [name], [name] ([id number]) Topical Primary Dressing Prisma 4.34 (in) Discharge Instruction: Moisten with KY jelly/hydrogel; Cover wound as directed. Do not remove from wound bed. Secondary Dressing ABD Pad 5x9 (in/in) Discharge Instruction: Cover with ABD pad Secured With 52M Medipore H Soft Cloth Surgical Tape, 2x2 (in/yd) Kerlix Roll Sterile or Non-Sterile 6-ply 4.5x4 (yd/yd) Discharge Instruction: Apply Kerlix as directed Tubigrip Size E, 3.5x10 (in/yds) Discharge Instruction: Apply 3 Tubigrip E 3-finger-widths below  knee to base of toes to secure dressing and/or for swelling. Compression Wrap Compression Stockings Add-Ons Electronic Signature(s) Signed: 03/16/2021 2:38:55 PM By: [name] Entered By: [name] on 03/16/2021 13:47:45 [name] ([id number]) -------------------------------------------------------------------------------- Vitals Details Patient Name: Brian Hams A. Date of Service: 03/16/2021 2:00 PM Medical Record Number: [id number] Patient Account Number: [id number] Date of Birth/Sex: 07-15-1991 (29  y.o. M) Treating RN: [name] Primary Care Serena Petterson: [name] Other Clinician: Referring Pellegrino Kennard: [name] Treating Eythan Jayne/Extender: [name] in Treatment: 16 Vital Signs Time Taken: 13:40 Temperature (F): 98.4 Height (in): 72 Pulse (bpm): 74 Weight (lbs): 185 Respiratory Rate (breaths/min): 16 Body Mass Index (BMI): 25.1 Blood Pressure (mmHg): 133/86 Reference Range: 80 - 120 mg / dl Electronic Signature(s) Signed: 03/16/2021 2:38:55 PM By: [name] Entered ByHansel Lee on 03/16/2021 13:42:27

## 2021-03-17 ENCOUNTER — Ambulatory Visit (HOSPITAL_COMMUNITY): Payer: 59 | Admitting: Physical Therapy

## 2021-03-17 ENCOUNTER — Other Ambulatory Visit: Payer: Self-pay

## 2021-03-17 DIAGNOSIS — M7989 Other specified soft tissue disorders: Secondary | ICD-10-CM | POA: Diagnosis not present

## 2021-03-17 DIAGNOSIS — M25632 Stiffness of left wrist, not elsewhere classified: Secondary | ICD-10-CM

## 2021-03-17 DIAGNOSIS — M25622 Stiffness of left elbow, not elsewhere classified: Secondary | ICD-10-CM

## 2021-03-17 DIAGNOSIS — Z7289 Other problems related to lifestyle: Secondary | ICD-10-CM | POA: Diagnosis not present

## 2021-03-17 DIAGNOSIS — R269 Unspecified abnormalities of gait and mobility: Secondary | ICD-10-CM | POA: Diagnosis not present

## 2021-03-17 DIAGNOSIS — Z86718 Personal history of other venous thrombosis and embolism: Secondary | ICD-10-CM | POA: Diagnosis not present

## 2021-03-17 DIAGNOSIS — F17210 Nicotine dependence, cigarettes, uncomplicated: Secondary | ICD-10-CM | POA: Diagnosis not present

## 2021-03-17 DIAGNOSIS — M6281 Muscle weakness (generalized): Secondary | ICD-10-CM

## 2021-03-17 DIAGNOSIS — M25572 Pain in left ankle and joints of left foot: Secondary | ICD-10-CM | POA: Diagnosis not present

## 2021-03-17 DIAGNOSIS — R262 Difficulty in walking, not elsewhere classified: Secondary | ICD-10-CM

## 2021-03-17 DIAGNOSIS — M79672 Pain in left foot: Secondary | ICD-10-CM | POA: Diagnosis not present

## 2021-03-17 DIAGNOSIS — I2699 Other pulmonary embolism without acute cor pulmonale: Secondary | ICD-10-CM | POA: Diagnosis not present

## 2021-03-17 DIAGNOSIS — I82412 Acute embolism and thrombosis of left femoral vein: Secondary | ICD-10-CM | POA: Diagnosis not present

## 2021-03-17 NOTE — Progress Notes (Signed)
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
## 2021-03-21 ENCOUNTER — Other Ambulatory Visit: Payer: Self-pay

## 2021-03-21 DIAGNOSIS — I824Y2 Acute embolism and thrombosis of unspecified deep veins of left proximal lower extremity: Secondary | ICD-10-CM

## 2021-03-22 ENCOUNTER — Other Ambulatory Visit: Payer: Self-pay

## 2021-03-22 ENCOUNTER — Encounter (HOSPITAL_COMMUNITY): Payer: Self-pay | Admitting: Physical Therapy

## 2021-03-22 ENCOUNTER — Ambulatory Visit (HOSPITAL_COMMUNITY): Payer: 59 | Attending: Orthopedic Surgery | Admitting: Physical Therapy

## 2021-03-22 DIAGNOSIS — M6281 Muscle weakness (generalized): Secondary | ICD-10-CM | POA: Diagnosis not present

## 2021-03-22 DIAGNOSIS — R262 Difficulty in walking, not elsewhere classified: Secondary | ICD-10-CM | POA: Diagnosis not present

## 2021-03-22 DIAGNOSIS — R29898 Other symptoms and signs involving the musculoskeletal system: Secondary | ICD-10-CM | POA: Diagnosis not present

## 2021-03-22 DIAGNOSIS — M79662 Pain in left lower leg: Secondary | ICD-10-CM | POA: Diagnosis not present
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
## 2021-03-24 ENCOUNTER — Ambulatory Visit (HOSPITAL_COMMUNITY): Payer: 59 | Admitting: Physical Therapy

## 2021-03-24 ENCOUNTER — Encounter (HOSPITAL_COMMUNITY): Payer: Self-pay | Admitting: Physical Therapy

## 2021-03-24 ENCOUNTER — Other Ambulatory Visit: Payer: Self-pay

## 2021-03-24 DIAGNOSIS — M6281 Muscle weakness (generalized): Secondary | ICD-10-CM | POA: Diagnosis not present

## 2021-03-24 DIAGNOSIS — R262 Difficulty in walking, not elsewhere classified: Secondary | ICD-10-CM | POA: Diagnosis not present

## 2021-03-24 DIAGNOSIS — M79662 Pain in left lower leg: Secondary | ICD-10-CM | POA: Diagnosis not present

## 2021-03-24 DIAGNOSIS — R29898 Other symptoms and signs involving the musculoskeletal system: Secondary | ICD-10-CM | POA: Diagnosis not present
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
## 2021-03-28 ENCOUNTER — Ambulatory Visit (HOSPITAL_COMMUNITY): Payer: 59

## 2021-03-28 ENCOUNTER — Other Ambulatory Visit: Payer: Self-pay

## 2021-03-28 DIAGNOSIS — R262 Difficulty in walking, not elsewhere classified: Secondary | ICD-10-CM

## 2021-03-28 DIAGNOSIS — M6281 Muscle weakness (generalized): Secondary | ICD-10-CM | POA: Diagnosis not present

## 2021-03-28 DIAGNOSIS — R29898 Other symptoms and signs involving the musculoskeletal system: Secondary | ICD-10-CM

## 2021-03-28 DIAGNOSIS — M79662 Pain in left lower leg: Secondary | ICD-10-CM
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
## 2021-03-31 ENCOUNTER — Ambulatory Visit (INDEPENDENT_AMBULATORY_CARE_PROVIDER_SITE_OTHER)
Admission: RE | Admit: 2021-03-31 | Discharge: 2021-03-31 | Disposition: A | Discharge status: Home / Self Care | Payer: 59 | Source: Ambulatory Visit | Attending: Vascular Surgery | Admitting: Vascular Surgery

## 2021-03-31 ENCOUNTER — Ambulatory Visit (INDEPENDENT_AMBULATORY_CARE_PROVIDER_SITE_OTHER): Payer: 59 | Admitting: Vascular Surgery

## 2021-03-31 ENCOUNTER — Ambulatory Visit (HOSPITAL_COMMUNITY)
Admission: RE | Admit: 2021-03-31 | Discharge: 2021-03-31 | Disposition: A | Discharge status: Home / Self Care | Payer: 59 | Source: Ambulatory Visit | Attending: Vascular Surgery | Admitting: Vascular Surgery

## 2021-03-31 ENCOUNTER — Encounter: Payer: Self-pay | Admitting: Vascular Surgery

## 2021-03-31 ENCOUNTER — Other Ambulatory Visit: Payer: Self-pay

## 2021-03-31 VITALS — BP 133/89 | HR 63 | Temp 98.4°F | Resp 20 | Ht 71.0 in | Wt 222.0 lb

## 2021-03-31 DIAGNOSIS — I824Y2 Acute embolism and thrombosis of unspecified deep veins of left proximal lower extremity: Secondary | ICD-10-CM

## 2021-03-31 DIAGNOSIS — M7989 Other specified soft tissue disorders: Secondary | ICD-10-CM
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
## 2021-04-01 ENCOUNTER — Ambulatory Visit (HOSPITAL_COMMUNITY): Payer: 59

## 2021-04-01 ENCOUNTER — Encounter (HOSPITAL_COMMUNITY): Payer: Self-pay

## 2021-04-01 DIAGNOSIS — M6281 Muscle weakness (generalized): Secondary | ICD-10-CM | POA: Diagnosis not present

## 2021-04-01 DIAGNOSIS — M79662 Pain in left lower leg: Secondary | ICD-10-CM | POA: Diagnosis not present

## 2021-04-01 DIAGNOSIS — R262 Difficulty in walking, not elsewhere classified: Secondary | ICD-10-CM | POA: Diagnosis not present

## 2021-04-01 DIAGNOSIS — R29898 Other symptoms and signs involving the musculoskeletal system: Secondary | ICD-10-CM | POA: Diagnosis not present
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
## 2021-04-04 ENCOUNTER — Ambulatory Visit (HOSPITAL_COMMUNITY): Payer: 59

## 2021-04-04 ENCOUNTER — Other Ambulatory Visit: Payer: Self-pay

## 2021-04-04 ENCOUNTER — Encounter (HOSPITAL_COMMUNITY): Payer: Self-pay

## 2021-04-04 DIAGNOSIS — M6281 Muscle weakness (generalized): Secondary | ICD-10-CM | POA: Diagnosis not present

## 2021-04-04 DIAGNOSIS — R29898 Other symptoms and signs involving the musculoskeletal system: Secondary | ICD-10-CM | POA: Diagnosis not present

## 2021-04-04 DIAGNOSIS — M79662 Pain in left lower leg: Secondary | ICD-10-CM | POA: Diagnosis not present

## 2021-04-04 DIAGNOSIS — R262 Difficulty in walking, not elsewhere classified: Secondary | ICD-10-CM
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
## 2021-04-05 ENCOUNTER — Encounter: Payer: 59 | Attending: Physician Assistant | Admitting: Physician Assistant

## 2021-04-05 DIAGNOSIS — T8131XD Disruption of external operation (surgical) wound, not elsewhere classified, subsequent encounter: Secondary | ICD-10-CM | POA: Insufficient documentation

## 2021-04-05 DIAGNOSIS — L97529 Non-pressure chronic ulcer of other part of left foot with unspecified severity: Secondary | ICD-10-CM | POA: Diagnosis present

## 2021-04-05 DIAGNOSIS — L97528 Non-pressure chronic ulcer of other part of left foot with other specified severity: Secondary | ICD-10-CM | POA: Insufficient documentation

## 2021-04-05 DIAGNOSIS — L97521 Non-pressure chronic ulcer of other part of left foot limited to breakdown of skin: Secondary | ICD-10-CM | POA: Diagnosis not present

## 2021-04-05 DIAGNOSIS — L98428 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: Secondary | ICD-10-CM | POA: Insufficient documentation

## 2021-04-05 DIAGNOSIS — Z87891 Personal history of nicotine dependence: Secondary | ICD-10-CM | POA: Diagnosis not present

## 2021-04-05 DIAGNOSIS — Z89432 Acquired absence of left foot: Secondary | ICD-10-CM | POA: Diagnosis not present

## 2021-04-05 DIAGNOSIS — T8131XA Disruption of external operation (surgical) wound, not elsewhere classified, initial encounter: Secondary | ICD-10-CM | POA: Diagnosis not present

## 2021-04-05 NOTE — Progress Notes (Addendum)
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 his legs much as possible. This will help with edema control though I think he is always can have some issues with edema due to the injury on this left leg. We will see patient back for reevaluation in 1 week here in the clinic. If anything worsens or changes patient will contact our office for additional recommendations. Electronic Signature(s) Signed: 04/05/2021 5:18:42 PM By: Lenda Kelp PA-C Entered By: Lenda Kelp on 04/05/2021 17:18:42 [name] ([id number]) -------------------------------------------------------------------------------- SuperBill Details Patient Name: [name] A. Date of Service: 04/05/2021 Medical Record Number: [id number] Patient Account Number: [id number] Date of Birth/Sex: 1991-02-28 (29 y.o. 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 Facility Procedures CPT4 Code:  46962952 Description: 84132 - WOUND CARE VISIT-LEV 2 EST PT Modifier: Quantity: 1 Physician Procedures CPT4 Code Description: 4401027 99214 - WC PHYS LEVEL 4 - EST PT Modifier: Quantity: 1 CPT4 Code Description: ICD-10 Diagnosis Description L97.528 Non-pressure chronic ulcer of other part of left foot with other specif L97.521 Non-pressure chronic ulcer of other part of left foot limited to breakd T81.31XD Disruption of external operation  (surgical) wound, not elsewhere classi 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

## 2021-04-05 NOTE — Progress Notes (Addendum)
KACETON, VIEAU ([id number]) Visit Report for 04/05/2021 Arrival Information Details Patient Name: [name], [name]. Date of Service: 04/05/2021 2:15 PM Medical Record Number: [id number] Patient Account Number: [id number] Date of Birth/Sex: November 05, 1990 (30 y.o. M) Treating RN: Hansel Feinstein Primary Care Revanth Neidig: Lilyan Punt Other Clinician: Referring Karlis Cregg: Lilyan Punt Treating Cherica Heiden/Extender: Rowan Blase in Treatment: 18 Visit Information History Since Last Visit Added or deleted any medications: No Patient Arrived: Crutches Had Lee fall or experienced change in No Arrival Time: 14:31 activities of daily living that may affect Accompanied By: mother risk of falls: Transfer Assistance: None Hospitalized since last visit: No Patient Identification Verified: Yes Has Dressing in Place as Prescribed: Yes Secondary Verification Process Completed: Yes Pain Present Now: No Patient Has Alerts: Yes Patient Alerts: Patient on Blood Thinner Eliquis NOT DIABETIC Electronic Signature(s) Signed: 04/05/2021 3:29:40 PM By: Hansel Feinstein Entered By: Hansel Feinstein on 04/05/2021 14:33:52 [name] ([id number]) -------------------------------------------------------------------------------- Clinic Level of Care Assessment Details Patient Name: [name] Lee. Date of Service: 04/05/2021 2:15 PM Medical Record Number: [id number] Patient Account Number: [id number] Date of Birth/Sex: [birth year] (30 y.o. M) Treating RN: Hansel Feinstein Primary Care Adalee Kathan: Lilyan Punt Other Clinician: Referring Angelisa Winthrop: Lilyan Punt Treating Lilyauna Miedema/Extender: Rowan Blase in Treatment: 18 Clinic Level of Care Assessment Items TOOL 4 Quantity Score []  - Use when only an EandM is performed on FOLLOW-UP visit 0 ASSESSMENTS - Nursing Assessment / Reassessment []  - Reassessment of Co-morbidities (includes updates in patient status) 0 []  - 0 Reassessment of Adherence to Treatment  Plan ASSESSMENTS - Wound and Skin Assessment / Reassessment X - Simple Wound Assessment / Reassessment - one wound 1 5 []  - 0 Complex Wound Assessment / Reassessment - multiple wounds []  - 0 Dermatologic / Skin Assessment (not related to wound area) ASSESSMENTS - Focused Assessment []  - Circumferential Edema Measurements - multi extremities 0 []  - 0 Nutritional Assessment / Counseling / Intervention []  - 0 Lower Extremity Assessment (monofilament, tuning fork, pulses) []  - 0 Peripheral Arterial Disease Assessment (using hand held doppler) ASSESSMENTS - Ostomy and/or Continence Assessment and Care []  - Incontinence Assessment and Management 0 []  - 0 Ostomy Care Assessment and Management (repouching, etc.) PROCESS - Coordination of Care X - Simple Patient / Family Education for ongoing care 1 15 []  - 0 Complex (extensive) Patient / Family Education for ongoing care X- 1 10 Staff obtains , Records, Test Results / Process Orders []  - 0 Staff telephones HHA, Nursing Homes / Clarify orders / etc []  - 0 Routine Transfer to another Facility (non-emergent condition) []  - 0 Routine Hospital Admission (non-emergent condition) []  - 0 New Admissions / / Ordering NPWT, Apligraf, etc. []  - 0 Emergency Hospital Admission (emergent condition) X- 1 10 Simple Discharge Coordination []  - 0 Complex (extensive) Discharge Coordination PROCESS - Special Needs []  - Pediatric / Minor Patient Management 0 []  - 0 Isolation Patient Management []  - 0 Hearing / Language / Visual special needs []  - 0 Assessment of Community assistance (transportation, D/C planning, etc.) []  - 0 Additional assistance / Altered mentation []  - 0 Support Surface(s) Assessment (bed, cushion, seat, etc.) INTERVENTIONS - Wound Cleansing / Measurement Conant, Caron Lee. ( ) X- 1 5 Simple Wound Cleansing - one wound []  - 0 Complex Wound Cleansing - multiple wounds X- 1 5 Wound  Imaging (photographs - any number of wounds) []  - 0 Wound Tracing (instead of photographs) X- 1 5 Simple Wound Measurement - one wound []  - 0  Complex Wound Measurement - multiple wounds INTERVENTIONS - Wound Dressings X - Small Wound Dressing one or multiple wounds 1 10 []  - 0 Medium Wound Dressing one or multiple wounds []  - 0 Large Wound Dressing one or multiple wounds []  - 0 Application of Medications - topical []  - 0 Application of Medications - injection INTERVENTIONS - Miscellaneous []  - External ear exam 0 []  - 0 Specimen Collection (cultures, biopsies, blood, body fluids, etc.) []  - 0 Specimen(s) / Culture(s) sent or taken to Lab for analysis []  - 0 Patient Transfer (multiple staff / Nurse, adultHoyer Lift / Similar devices) []  - 0 Simple Staple / Suture removal (25 or less) []  - 0 Complex Staple / Suture removal (26 or more) []  - 0 Hypo / Hyperglycemic Management (close monitor of Blood Glucose) []  - 0 Ankle / Brachial Index (ABI) - do not check if billed separately X- 1 5 Vital Signs Has the patient been seen at the hospital within the last three years: Yes Total Score: 70 Level Of Care: New/Established - Level 2 Electronic Signature(s) Signed: 04/05/2021 3:29:40 PM By: Hansel FeinsteinBishop, Joy Entered By: Hansel FeinsteinBishop, Joy on 04/05/2021 15:18:11 Verna CzechWALKER, Delson Lee. ([id number]) -------------------------------------------------------------------------------- Encounter Discharge Information Details Patient Name: Brian HamsWALKER, Erian Lee. Date of Service: 04/05/2021 2:15 PM Medical Record Number: [id number] Patient Account Number: [id number] Date of Birth/Sex: 07/05/1991 (30 y.o. M) Treating RN: Hansel FeinsteinBishop, Joy Primary Care Lanis Storlie: Lilyan PuntLuking, Scott Other Clinician: Referring Rocklyn Mayberry: Lilyan PuntLuking, Scott Treating Veniamin Kincaid/Extender: Rowan BlaseStone, Hoyt Weeks in Treatment: 18 Encounter Discharge Information Items Discharge Condition: Stable Ambulatory Status: Crutches Discharge Destination: Home Transportation:  Private Auto Accompanied By: mother Schedule Follow-up Appointment: Yes Clinical Summary of Care: Electronic Signature(s) Signed: 04/05/2021 3:29:40 PM By: Hansel FeinsteinBishop, Joy Entered By: Hansel FeinsteinBishop, Joy on 04/05/2021 15:29:14 Verna CzechWALKER, Sabas Lee. ([id number]) -------------------------------------------------------------------------------- Lower Extremity Assessment Details Patient Name: Brian HamsWALKER, Rayon Lee. Date of Service: 04/05/2021 2:15 PM Medical Record Number: [id number] Patient Account Number: [id number] Date of Birth/Sex: 03/28/1991 (29 y.o. M) Treating RN: Hansel FeinsteinBishop, Joy Primary Care Moranda Billiot: Lilyan PuntLuking, Scott Other Clinician: Referring Margherita Collyer: Lilyan PuntLuking, Scott Treating Tyronne Blann/Extender: Allen DerryStone, Hoyt Weeks in Treatment: 18 Edema Assessment Assessed: [Left: Yes] [Right: No] Edema: [Left: N] [Right: o] Electronic Signature(s) Signed: 04/05/2021 3:29:40 PM By: Hansel FeinsteinBishop, Joy Entered By: Hansel FeinsteinBishop, Joy on 04/05/2021 14:42:24 Verna CzechWALKER, Clayson Lee. ([id number]) -------------------------------------------------------------------------------- Multi Wound Chart Details Patient Name: Brian HamsWALKER, Rosalie Lee. Date of Service: 04/05/2021 2:15 PM Medical Record Number: [id number] Patient Account Number: [id number] Date of Birth/Sex: 11/27/1990 (29 y.o. M) Treating RN: Hansel FeinsteinBishop, Joy Primary Care Janay Canan: Lilyan PuntLuking, Scott Other Clinician: Referring Makell Drohan: Lilyan PuntLuking, Scott Treating Sharlon Pfohl/Extender: Rowan BlaseStone, Hoyt Weeks in Treatment: 18 Vital Signs Height(in): 72 Pulse(bpm): 64 Weight(lbs): 185 Blood Pressure(mmHg): 128/82 Body Mass Index(BMI): 25 Temperature(F): 98.2 Respiratory Rate(breaths/min): 16 Photos: [N/Lee:N/Lee] Wound Location: Left, Medial Foot N/Lee N/Lee Wounding Event: Surgical Injury N/Lee N/Lee Primary Etiology: Trauma, Other N/Lee N/Lee Comorbid History: Neuropathy N/Lee N/Lee Date Acquired: 06/04/2020 N/Lee N/Lee Weeks of Treatment: 18 N/Lee N/Lee Wound Status: Open N/Lee N/Lee Measurements L x W x D (cm) 0.2x0.2x0.1 N/Lee N/Lee Area (cm) :  0.031 N/Lee N/Lee Volume (cm) : 0.003 N/Lee N/Lee % Reduction in Area: 98.50% N/Lee N/Lee % Reduction in Volume: 99.60% N/Lee N/Lee Classification: Full Thickness Without Exposed N/Lee N/Lee Support Structures Exudate Amount: Medium N/Lee N/Lee Exudate Type: Serosanguineous N/Lee N/Lee Exudate Color: red, brown N/Lee N/Lee Granulation Amount: Large (67-100%) N/Lee N/Lee Granulation Quality: Pink N/Lee N/Lee Necrotic Amount: Small (1-33%) N/Lee N/Lee Exposed Structures: Fat Layer (Subcutaneous Tissue): N/Lee N/Lee Yes Fascia: No Tendon: No Muscle: No Joint:  No Bone: No Epithelialization: Small (1-33%) N/Lee N/Lee Treatment Notes Electronic Signature(s) Signed: 04/05/2021 3:29:40 PM By: Hansel Feinstein Entered By: Hansel Feinstein on 04/05/2021 14:43:12 [name] ([id number]) -------------------------------------------------------------------------------- Multi-Disciplinary Care Plan Details Patient Name: [name] Lee. Date of Service: 04/05/2021 2:15 PM Medical Record Number: [id number] Patient Account Number: [id number] Date of Birth/Sex: 05/16/1991 (29 y.o. M) Treating RN: Hansel Feinstein Primary Care Shacoya Burkhammer: Lilyan Punt Other Clinician: Referring Sapna Padron: Lilyan Punt Treating Duc Crocket/Extender: Rowan Blase in Treatment: 18 Active Inactive Electronic Signature(s) Signed: 04/05/2021 3:29:40 PM By: Hansel Feinstein Entered By: Hansel Feinstein on 04/05/2021 14:42:36 [name] ([id number]) -------------------------------------------------------------------------------- Pain Assessment Details Patient Name: [name] Lee. Date of Service: 04/05/2021 2:15 PM Medical Record Number: [id number] Patient Account Number: [id number] Date of Birth/Sex: Jul 24, 1991 (29 y.o. M) Treating RN: Hansel Feinstein Primary Care Valia Wingard: Lilyan Punt Other Clinician: Referring Telitha Plath: Lilyan Punt Treating Tydus Sanmiguel/Extender: Rowan Blase in Treatment: 18 Active Problems Location of Pain Severity and Description of Pain Patient  Has Paino No Site Locations Rate the pain. Current Pain Level: 0 Pain Management and Medication Current Pain Management: Electronic Signature(s) Signed: 04/05/2021 3:29:40 PM By: Hansel Feinstein Entered By: Hansel Feinstein on 04/05/2021 14:35:48 [name] ([id number]) -------------------------------------------------------------------------------- Patient/Caregiver Education Details Patient Name: [name] Lee. Date of Service: 04/05/2021 2:15 PM Medical Record Number: [id number] Patient Account Number: [id number] Date of Birth/Gender: 1990-10-17 (29 y.o. M) Treating RN: Hansel Feinstein Primary Care Physician: Lilyan Punt Other Clinician: Referring Physician: Lilyan Punt Treating Physician/Extender: Rowan Blase in Treatment: 23 Education Assessment Education Provided To: Patient Education Topics Provided Basic Hygiene: Wound/Skin Impairment: Electronic Signature(s) Signed: 04/05/2021 3:29:40 PM By: Hansel Feinstein Entered By: Hansel Feinstein on 04/05/2021 15:28:24 [name] ([id number]) -------------------------------------------------------------------------------- Wound Assessment Details Patient Name: [name] Lee. Date of Service: 04/05/2021 2:15 PM Medical Record Number: [id number] Patient Account Number: [id number] Date of Birth/Sex: [birth year] (29 y.o. M) Treating RN: Hansel Feinstein Primary Care Antwaun Buth: Lilyan Punt Other Clinician: Referring Baylie Drakes: Lilyan Punt Treating Kristol Almanzar/Extender: Rowan Blase in Treatment: 18 Wound Status Wound Number: 1 Primary Etiology: Trauma, Other Wound Location: Left, Medial Foot Wound Status: Open Wounding Event: Surgical Injury Comorbid History: Neuropathy Date Acquired: 06/04/2020 Weeks Of Treatment: 18 Clustered Wound: No Photos Wound Measurements Length: (cm) 0.2 Width: (cm) 0.2 Depth: (cm) 0.1 Area: (cm) 0.031 Volume: (cm) 0.003 % Reduction in Area: 98.5% % Reduction in Volume:  99.6% Epithelialization: Small (1-33%) Tunneling: No Undermining: No Wound Description Classification: Full Thickness Without Exposed Support Structu Exudate Amount: Medium Exudate Type: Serosanguineous Exudate Color: red, brown res Foul Odor After Cleansing: No Slough/Fibrino Yes Wound Bed Granulation Amount: Large (67-100%) Exposed Structure Granulation Quality: Pink Fascia Exposed: No Necrotic Amount: Small (1-33%) Fat Layer (Subcutaneous Tissue) Exposed: Yes Necrotic Quality: Adherent Slough Tendon Exposed: No Muscle Exposed: No Joint Exposed: No Bone Exposed: No Treatment Notes Wound #1 (Foot) Wound Laterality: Left, Medial Cleanser Soap and Water Discharge Instruction: Gently cleanse wound with antibacterial soap, rinse and pat dry prior to dressing wounds Peri-Wound Care JONNY, DEARDEN Lee. ([id number]) Topical Primary Dressing Prisma 4.34 (in) Discharge Instruction: Moisten with KY jelly/hydrogel; Cover wound as directed. Do not remove from wound bed. Secondary Dressing Mepilex Border Flex, 4x4 (in/in) Discharge Instruction: Apply to wound as directed. Do not cut. Secured With Tubigrip Size E, 3.5x10 (in/yds) Discharge Instruction: Apply 3 Tubigrip E 3-finger-widths below knee to base of toes to secure dressing and/or for swelling. Compression Wrap Compression Stockings Add-Ons Electronic Signature(s) Signed: 04/05/2021 3:29:40 PM By: Hansel Feinstein  Entered By: Hansel Feinstein on 04/05/2021 14:42:08 [name] ([id number]) -------------------------------------------------------------------------------- Vitals Details Patient Name: [name] Lee. Date of Service: 04/05/2021 2:15 PM Medical Record Number: [id number] Patient Account Number: [id number] Date of Birth/Sex: Feb 26, 1991 (29 y.o. M) Treating RN: Hansel Feinstein Primary Care Rhyann Berton: Lilyan Punt Other Clinician: Referring Keo Schirmer: Lilyan Punt Treating Eria Lozoya/Extender: Rowan Blase in Treatment:  18 Vital Signs Time Taken: 14:35 Temperature (F): 98.2 Height (in): 72 Pulse (bpm): 64 Weight (lbs): 185 Respiratory Rate (breaths/min): 16 Body Mass Index (BMI): 25.1 Blood Pressure (mmHg): 128/82 Reference Range: 80 - 120 mg / dl Electronic Signature(s) Signed: 04/05/2021 3:29:40 PM By: Hansel Feinstein Entered ByHansel Feinstein on 04/05/2021 14:34:43

## 2021-04-06 ENCOUNTER — Ambulatory Visit: Payer: 59 | Admitting: Internal Medicine

## 2021-04-06 ENCOUNTER — Ambulatory Visit (HOSPITAL_COMMUNITY): Payer: 59 | Admitting: Physical Therapy

## 2021-04-08 DIAGNOSIS — S91302A Unspecified open wound, left foot, initial encounter: Secondary | ICD-10-CM | POA: Diagnosis not present

## 2021-04-10 ENCOUNTER — Other Ambulatory Visit (HOSPITAL_COMMUNITY): Payer: Self-pay

## 2021-04-11 ENCOUNTER — Other Ambulatory Visit (HOSPITAL_COMMUNITY): Payer: Self-pay

## 2021-04-11 ENCOUNTER — Ambulatory Visit (HOSPITAL_COMMUNITY): Payer: 59 | Admitting: Physical Therapy

## 2021-04-11 ENCOUNTER — Other Ambulatory Visit: Payer: Self-pay

## 2021-04-11 ENCOUNTER — Encounter (HOSPITAL_COMMUNITY): Payer: Self-pay | Admitting: Physical Therapy

## 2021-04-11 DIAGNOSIS — R262 Difficulty in walking, not elsewhere classified: Secondary | ICD-10-CM | POA: Diagnosis not present

## 2021-04-11 DIAGNOSIS — M6281 Muscle weakness (generalized): Secondary | ICD-10-CM

## 2021-04-11 DIAGNOSIS — R29898 Other symptoms and signs involving the musculoskeletal system: Secondary | ICD-10-CM

## 2021-04-11 DIAGNOSIS — M79662 Pain in left lower leg: Secondary | ICD-10-CM | POA: Diagnosis not present
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
## 2021-04-13 ENCOUNTER — Other Ambulatory Visit: Payer: Self-pay

## 2021-04-13 ENCOUNTER — Ambulatory Visit (HOSPITAL_COMMUNITY): Payer: 59 | Admitting: Physical Therapy

## 2021-04-13 DIAGNOSIS — M6281 Muscle weakness (generalized): Secondary | ICD-10-CM | POA: Diagnosis not present

## 2021-04-13 DIAGNOSIS — M79662 Pain in left lower leg: Secondary | ICD-10-CM | POA: Diagnosis not present

## 2021-04-13 DIAGNOSIS — R29898 Other symptoms and signs involving the musculoskeletal system: Secondary | ICD-10-CM | POA: Diagnosis not present

## 2021-04-13 DIAGNOSIS — R262 Difficulty in walking, not elsewhere classified: Secondary | ICD-10-CM
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
## 2021-04-18 ENCOUNTER — Encounter (HOSPITAL_COMMUNITY): Payer: 59 | Admitting: Physical Therapy

## 2021-04-21 ENCOUNTER — Encounter (HOSPITAL_COMMUNITY): Payer: 59 | Admitting: Physical Therapy

## 2021-04-26 ENCOUNTER — Other Ambulatory Visit: Payer: Self-pay

## 2021-04-26 ENCOUNTER — Ambulatory Visit (HOSPITAL_COMMUNITY): Payer: 59 | Attending: Hematology | Admitting: Physical Therapy

## 2021-04-26 DIAGNOSIS — M6281 Muscle weakness (generalized): Secondary | ICD-10-CM | POA: Diagnosis not present

## 2021-04-26 DIAGNOSIS — Z89422 Acquired absence of other left toe(s): Secondary | ICD-10-CM | POA: Diagnosis not present

## 2021-04-26 DIAGNOSIS — Z89412 Acquired absence of left great toe: Secondary | ICD-10-CM | POA: Diagnosis not present

## 2021-04-26 DIAGNOSIS — M21372 Foot drop, left foot: Secondary | ICD-10-CM | POA: Diagnosis not present

## 2021-04-26 DIAGNOSIS — R29898 Other symptoms and signs involving the musculoskeletal system: Secondary | ICD-10-CM | POA: Diagnosis not present

## 2021-04-26 DIAGNOSIS — M79662 Pain in left lower leg: Secondary | ICD-10-CM | POA: Insufficient documentation

## 2021-04-26 DIAGNOSIS — R262 Difficulty in walking, not elsewhere classified: Secondary | ICD-10-CM | POA: Insufficient documentation
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
## 2021-04-27 ENCOUNTER — Other Ambulatory Visit (HOSPITAL_COMMUNITY): Payer: Self-pay

## 2021-04-27 ENCOUNTER — Encounter: Payer: 59 | Attending: Internal Medicine | Admitting: Internal Medicine

## 2021-04-27 DIAGNOSIS — X58XXXD Exposure to other specified factors, subsequent encounter: Secondary | ICD-10-CM | POA: Insufficient documentation

## 2021-04-27 DIAGNOSIS — S81802A Unspecified open wound, left lower leg, initial encounter: Secondary | ICD-10-CM | POA: Diagnosis not present

## 2021-04-27 DIAGNOSIS — B353 Tinea pedis: Secondary | ICD-10-CM | POA: Diagnosis not present

## 2021-04-27 DIAGNOSIS — L97528 Non-pressure chronic ulcer of other part of left foot with other specified severity: Secondary | ICD-10-CM | POA: Diagnosis not present

## 2021-04-27 DIAGNOSIS — Z87891 Personal history of nicotine dependence: Secondary | ICD-10-CM | POA: Insufficient documentation

## 2021-04-27 DIAGNOSIS — T8131XD Disruption of external operation (surgical) wound, not elsewhere classified, subsequent encounter: Secondary | ICD-10-CM | POA: Diagnosis not present

## 2021-04-27 DIAGNOSIS — Z89422 Acquired absence of other left toe(s): Secondary | ICD-10-CM | POA: Diagnosis not present

## 2021-04-27 DIAGNOSIS — S91302A Unspecified open wound, left foot, initial encounter: Secondary | ICD-10-CM | POA: Diagnosis not present

## 2021-04-27 MED ORDER — KETOCONAZOLE 2 % EX CREA
1.0000 "application " | TOPICAL_CREAM | Freq: Every day | CUTANEOUS | 1 refills | Status: DC
  Filled 2021-04-27: qty 60, 30d supply, fill #0

## 2021-04-27 NOTE — Progress Notes (Signed)
Brian, Lee ([id number]) Visit Report for 04/27/2021 Arrival Information Details Patient Name: [name], [name]. Date of Service: 04/27/2021 8:45 AM Medical Record Number: [id number] Patient Account Number: [id number] Date of Birth/Sex: 1990/10/01 (30 y.o. M) Treating RN: Brian Feinstein Primary Care Bevin Mayall: Lilyan Punt Other Clinician: Referring Gianni Mihalik: Lilyan Punt Treating Dawsen Krieger/Extender: Tilda Franco in Treatment: 22 Visit Information History Since Last Visit Added or deleted any medications: No Patient Arrived: Crutches Had a fall or experienced change in No Arrival Time: 08:53 activities of daily living that may affect Accompanied By: mother risk of falls: Transfer Assistance: None Hospitalized since last visit: No Patient Identification Verified: Yes Has Dressing in Place as Prescribed: Yes Secondary Verification Process Completed: Yes Pain Present Now: No Patient Has Alerts: Yes Patient Alerts: Patient on Blood Thinner Eliquis NOT DIABETIC Electronic Signature(s) Signed: 04/27/2021 11:13:00 AM By: Brian Feinstein Entered By: Brian Feinstein on 04/27/2021 08:54:38 [name] ([id number]) -------------------------------------------------------------------------------- Clinic Level of Care Assessment Details Patient Name: [name]. Date of Service: 04/27/2021 8:45 AM Medical Record Number: [id number] Patient Account Number: [id number] Date of Birth/Sex: 12/15/1990 (30 y.o. M) Treating RN: Brian Feinstein Primary Care Albertha Beattie: Lilyan Punt Other Clinician: Referring Huber Mathers: Lilyan Punt Treating Avanni Turnbaugh/Extender: Tilda Franco in Treatment: 22 Clinic Level of Care Assessment Items TOOL 4 Quantity Score []  - Use when only an EandM is performed on FOLLOW-UP visit 0 ASSESSMENTS - Nursing Assessment / Reassessment []  - Reassessment of Co-morbidities (includes updates in patient status) 0 []  - 0 Reassessment of Adherence to Treatment  Plan ASSESSMENTS - Wound and Skin Assessment / Reassessment []  - Simple Wound Assessment / Reassessment - one wound 0 X- 2 5 Complex Wound Assessment / Reassessment - multiple wounds []  - 0 Dermatologic / Skin Assessment (not related to wound area) ASSESSMENTS - Focused Assessment []  - Circumferential Edema Measurements - multi extremities 0 []  - 0 Nutritional Assessment / Counseling / Intervention []  - 0 Lower Extremity Assessment (monofilament, tuning fork, pulses) []  - 0 Peripheral Arterial Disease Assessment (using hand held doppler) ASSESSMENTS - Ostomy and/or Continence Assessment and Care []  - Incontinence Assessment and Management 0 []  - 0 Ostomy Care Assessment and Management (repouching, etc.) PROCESS - Coordination of Care X - Simple Patient / Family Education for ongoing care 1 15 []  - 0 Complex (extensive) Patient / Family Education for ongoing care []  - 0 Staff obtains , Records, Test Results / Process Orders []  - 0 Staff telephones HHA, Nursing Homes / Clarify orders / etc []  - 0 Routine Transfer to another Facility (non-emergent condition) []  - 0 Routine Hospital Admission (non-emergent condition) []  - 0 New Admissions / / Ordering NPWT, Apligraf, etc. []  - 0 Emergency Hospital Admission (emergent condition) X- 1 10 Simple Discharge Coordination []  - 0 Complex (extensive) Discharge Coordination PROCESS - Special Needs []  - Pediatric / Minor Patient Management 0 []  - 0 Isolation Patient Management []  - 0 Hearing / Language / Visual special needs []  - 0 Assessment of Community assistance (transportation, D/C planning, etc.) []  - 0 Additional assistance / Altered mentation []  - 0 Support Surface(s) Assessment (bed, cushion, seat, etc.) INTERVENTIONS - Wound Cleansing / Measurement [name], [name] ( ) []  - 0 Simple Wound Cleansing - one wound X- 2 5 Complex Wound Cleansing - multiple wounds []  - 0 Wound  Imaging (photographs - any number of wounds) []  - 0 Wound Tracing (instead of photographs) []  - 0 Simple Wound Measurement - one wound X- 2 5 Complex  Wound Measurement - multiple wounds INTERVENTIONS - Wound Dressings []  - Small Wound Dressing one or multiple wounds 0 X- 2 15 Medium Wound Dressing one or multiple wounds []  - 0 Large Wound Dressing one or multiple wounds X- 1 5 Application of Medications - topical []  - 0 Application of Medications - injection INTERVENTIONS - Miscellaneous []  - External ear exam 0 []  - 0 Specimen Collection (cultures, biopsies, blood, body fluids, etc.) []  - 0 Specimen(s) / Culture(s) sent or taken to Lab for analysis []  - 0 Patient Transfer (multiple staff / / Similar devices) []  - 0 Simple Staple / Suture removal (25 or less) []  - 0 Complex Staple / Suture removal (26 or more) []  - 0 Hypo / Hyperglycemic Management (close monitor of Blood Glucose) []  - 0 Ankle / Brachial Index (ABI) - do not check if billed separately X- 1 5 Vital Signs Has the patient been seen at the hospital within the last three years: Yes Total Score: 95 Level Of Care: New/Established - Level 3 Electronic Signature(s) Signed: 04/27/2021 11:13:00 AM By: Entered By: on 04/27/2021 09:33:07 ( ) -------------------------------------------------------------------------------- Encounter Discharge Information Details Patient Name: [name], [name] Date of Service: 04/27/2021 8:45 AM Medical Record Number: Patient Account Number: Date of Birth/Sex: 1991-04-07 (30 y.o. M) Treating RN: Brian Feinstein Primary Care Kwesi Sangha: Brian Feinstein Other Clinician: Referring Siris Hoos: 06/27/2021 Treating May Ozment/Extender: [name] in Treatment: 22 Encounter Discharge Information Items Discharge Condition: Stable Ambulatory Status: Crutches Discharge Destination: Home Transportation:  Other Accompanied By: mother Schedule Follow-up Appointment: Yes Clinical Summary of Care: Electronic Signature(s) Signed: 04/27/2021 11:13:00 AM By: [name] Entered By: 06/27/2021 on 04/27/2021 09:41:23 [id number] (06/25/1991) -------------------------------------------------------------------------------- Lower Extremity Assessment Details Patient Name: 05-01-2000 A. Date of Service: 04/27/2021 8:45 AM Medical Record Number: Lilyan Punt Patient Account Number: Lilyan Punt Date of Birth/Sex: [birth year] (30 y.o. M) Treating RN: Brian Feinstein Primary Care Bhavana Kady: Brian Feinstein Other Clinician: Referring Elliet Goodnow: 06/27/2021 Treating Jawaan Adachi/Extender: [name] in Treatment: 22 Edema Assessment Assessed: [Left: Yes] [Right: No] Edema: [Left: N] [Right: o] Notes Just came out of his orthotic boot 9/6 and started wearing an orthotic with a shoe Electronic Signature(s) Signed: 04/27/2021 11:13:00 AM By: 06/27/2021 Entered By: [id number] on 04/27/2021 09:04:57 06/25/1991 (05-01-2000) -------------------------------------------------------------------------------- Multi Wound Chart Details Patient Name: Brian Feinstein A. Date of Service: 04/27/2021 8:45 AM Medical Record Number: Lilyan Punt Patient Account Number: Tilda Franco Date of Birth/Sex: [birth year] (30 y.o. M) Treating RN: Brian Feinstein Primary Care Sweet Jarvis: Brian Feinstein Other Clinician: Referring Brynda Heick: 06/27/2021 Treating Rayn Enderson/Extender: [name] in Treatment: 22 Vital Signs Height(in): 72 Pulse(bpm): 64 Weight(lbs): 185 Blood Pressure(mmHg): 129/80 Body Mass Index(BMI): 25 Temperature(F): 97.7 Respiratory Rate(breaths/min): 16 Photos: [N/A:N/A] Wound Location: Left, Medial Foot Left, Posterior Ankle N/A Wounding Event: Surgical Injury Gradually Appeared N/A Primary Etiology: Trauma, Other Abrasion N/A Comorbid History: Neuropathy Neuropathy N/A Date Acquired:  06/04/2020 04/13/2021 N/A Weeks of Treatment: 22 0 N/A Wound Status: Open Open N/A Measurements L x W x D (cm) 0.2x0.2x0.2 0.3x0.1x0.1 N/A Area (cm) : 0.031 0.024 N/A Volume (cm) : 0.006 0.002 N/A % Reduction in Area: 98.50% N/A N/A % Reduction in Volume: 99.30% N/A N/A Classification: Full Thickness Without Exposed Full Thickness Without Exposed N/A Support Structures Support Structures Exudate Amount: Medium Medium N/A Exudate Type: Serosanguineous Serosanguineous N/A Exudate Color: red, brown red, brown N/A Granulation Amount: Large (67-100%) Small (1-33%) N/A Granulation Quality: Red,  Pink Red N/A Necrotic Amount: Small (1-33%) Large (67-100%) N/A Necrotic Tissue: Adherent Slough Eschar N/A Exposed Structures: Fat Layer (Subcutaneous Tissue): Fat Layer (Subcutaneous Tissue): N/A Yes Yes Fascia: No Fascia: No Tendon: No Tendon: No Muscle: No Muscle: No Joint: No Joint: No Bone: No Bone: No Epithelialization: Small (1-33%) N/A N/A Assessment Notes: N/A former surgery site friction/abrasion N/A appeared to have formed with friction wearing boot he stated Treatment Notes Wound #1 (Foot) Wound Laterality: Left, Medial Cleanser Soap and Water Discharge Instruction: Gently cleanse wound with antibacterial soap, rinse and pat dry prior to dressing wounds LABRANDON, KNOCH A. ([id number]) Peri-Wound Care Topical Primary Dressing Prisma 4.34 (in) Discharge Instruction: Moisten with KY jelly/hydrogel; Cover wound as directed. Do not remove from wound bed. Secondary Dressing Gauze Secured With 73M Medipore H Soft Cloth Surgical Tape, 2x2 (in/yd) Tubigrip Size E, 3.5x10 (in/yds) Discharge Instruction: Apply 3 Tubigrip E 3-finger-widths below knee to base of toes to secure dressing and/or for swelling. Compression Wrap Compression Stockings Add-Ons Wound #6 (Ankle) Wound Laterality: Left, Posterior Cleanser Soap and Water Discharge Instruction: Gently cleanse wound with  antibacterial soap, rinse and pat dry prior to dressing wounds Wound Cleanser Discharge Instruction: Wash your hands with soap and water. Remove old dressing, discard into plastic bag and place into trash. Cleanse the wound with Wound Cleanser prior to applying a clean dressing using gauze sponges, not tissues or cotton balls. Do not scrub or use excessive force. Pat dry using gauze sponges, not tissue or cotton balls. Peri-Wound Care Topical Primary Dressing Silvercel Small 2x2 (in/in) Discharge Instruction: Apply Silvercel Small 2x2 (in/in) as instructed Secondary Dressing Coverlet Latex-Free Fabric Adhesive Dressings Discharge Instruction: 1.5 x 2 Secured With Compression Wrap Compression Stockings Add-Ons Electronic Signature(s) Signed: 04/27/2021 9:49:45 AM By: Geralyn Corwin DO Entered By: Geralyn Corwin on 04/27/2021 09:48:37 [name] ([id number]) -------------------------------------------------------------------------------- Multi-Disciplinary Care Plan Details Patient Name: [name] A. Date of Service: 04/27/2021 8:45 AM Medical Record Number: [id number] Patient Account Number: [id number] Date of Birth/Sex: March 14, 1991 (30 y.o. M) Treating RN: Brian Feinstein Primary Care Zackari Ruane: Lilyan Punt Other Clinician: Referring Ahliya Glatt: Lilyan Punt Treating Abimbola Aki/Extender: Tilda Franco in Treatment: 22 Active Inactive Electronic Signature(s) Signed: 04/27/2021 11:13:00 AM By: Brian Feinstein Entered By: Brian Feinstein on 04/27/2021 09:05:04 [name] ([id number]) -------------------------------------------------------------------------------- Pain Assessment Details Patient Name: [name] A. Date of Service: 04/27/2021 8:45 AM Medical Record Number: [id number] Patient Account Number: [id number] Date of Birth/Sex: [birth year] (30 y.o. M) Treating RN: Brian Feinstein Primary Care Evana Runnels: Lilyan Punt Other Clinician: Referring Intisar Claudio: Lilyan Punt Treating Jaskarn Schweer/Extender: Tilda Franco in Treatment: 22 Active Problems Location of Pain Severity and Description of Pain Patient Has Paino No Site Locations Rate the pain. Current Pain Level: 0 Pain Management and Medication Current Pain Management: Electronic Signature(s) Signed: 04/27/2021 11:13:00 AM By: Brian Feinstein Entered By: Brian Feinstein on 04/27/2021 08:57:26 [name] ([id number]) -------------------------------------------------------------------------------- Patient/Caregiver Education Details Patient Name: [name] A. Date of Service: 04/27/2021 8:45 AM Medical Record Number: [id number] Patient Account Number: [id number] Date of Birth/Gender: Nov 06, 1990 (30 y.o. M) Treating RN: Brian Feinstein Primary Care Physician: Lilyan Punt Other Clinician: Referring Physician: Lilyan Punt Treating Physician/Extender: Tilda Franco in Treatment: 22 Education Assessment Education Provided To: Patient Education Topics Provided Basic Hygiene: Wound/Skin Impairment: Electronic Signature(s) Signed: 04/27/2021 11:13:00 AM By: Brian Feinstein Entered By: Brian Feinstein on 04/27/2021 09:05:33 [name] ([id number]) -------------------------------------------------------------------------------- Wound Assessment Details Patient Name: [name] A. Date of Service: 04/27/2021 8:45 AM Medical Record  Number: [id number] Patient Account Number: [id number] Date of Birth/Sex: 02/13/1991 (30 y.o. M) Treating RN: Brian FeinsteinBishop, Joy Primary Care Jacquelynn Friend: Lilyan PuntLuking, Scott Other Clinician: Referring Chaseton Yepiz: Lilyan PuntLuking, Scott Treating Pelagia Iacobucci/Extender: Tilda FrancoHoffman, Jessica Weeks in Treatment: 22 Wound Status Wound Number: 1 Primary Etiology: Trauma, Other Wound Location: Left, Medial Foot Wound Status: Open Wounding Event: Surgical Injury Comorbid History: Neuropathy Date Acquired: 06/04/2020 Weeks Of Treatment: 22 Clustered Wound: No Photos Wound  Measurements Length: (cm) 0.2 Width: (cm) 0.2 Depth: (cm) 0.2 Area: (cm) 0.031 Volume: (cm) 0.006 % Reduction in Area: 98.5% % Reduction in Volume: 99.3% Epithelialization: Small (1-33%) Tunneling: No Undermining: No Wound Description Classification: Full Thickness Without Exposed Support Structu Exudate Amount: Medium Exudate Type: Serosanguineous Exudate Color: red, brown res Foul Odor After Cleansing: No Slough/Fibrino Yes Wound Bed Granulation Amount: Large (67-100%) Exposed Structure Granulation Quality: Red, Pink Fascia Exposed: No Necrotic Amount: Small (1-33%) Fat Layer (Subcutaneous Tissue) Exposed: Yes Necrotic Quality: Adherent Slough Tendon Exposed: No Muscle Exposed: No Joint Exposed: No Bone Exposed: No Treatment Notes Wound #1 (Foot) Wound Laterality: Left, Medial Cleanser Soap and Water Discharge Instruction: Gently cleanse wound with antibacterial soap, rinse and pat dry prior to dressing wounds Peri-Wound Care Brian HamsWALKER, [name] ([id number]) Topical Primary Dressing Prisma 4.34 (in) Discharge Instruction: Moisten with KY jelly/hydrogel; Cover wound as directed. Do not remove from wound bed. Secondary Dressing Gauze Secured With 35M Medipore H Soft Cloth Surgical Tape, 2x2 (in/yd) Tubigrip Size E, 3.5x10 (in/yds) Discharge Instruction: Apply 3 Tubigrip E 3-finger-widths below knee to base of toes to secure dressing and/or for swelling. Compression Wrap Compression Stockings Add-Ons Electronic Signature(s) Signed: 04/27/2021 11:13:00 AM By: Brian FeinsteinBishop, Joy Entered By: Brian FeinsteinBishop, Joy on 04/27/2021 09:04:19 Brian CzechWALKER, [name] ([id number]) -------------------------------------------------------------------------------- Wound Assessment Details Patient Name: Brian HamsWALKER, [name] Date of Service: 04/27/2021 8:45 AM Medical Record Number: [id number] Patient Account Number: [id number] Date of Birth/Sex: 03/26/1991 (30 y.o. M) Treating RN: Brian FeinsteinBishop, Joy Primary Care  Ashlynne Shetterly: Lilyan PuntLuking, Scott Other Clinician: Referring Cobain Morici: Lilyan PuntLuking, Scott Treating Mattia Liford/Extender: Tilda FrancoHoffman, Jessica Weeks in Treatment: 22 Wound Status Wound Number: 6 Primary Etiology: Abrasion Wound Location: Left, Posterior Ankle Wound Status: Open Wounding Event: Gradually Appeared Comorbid History: Neuropathy Date Acquired: 04/13/2021 Weeks Of Treatment: 0 Clustered Wound: No Photos Wound Measurements Length: (cm) 0.3 Width: (cm) 0.1 Depth: (cm) 0.1 Area: (cm) 0.024 Volume: (cm) 0.002 % Reduction in Area: % Reduction in Volume: Tunneling: No Undermining: No Wound Description Classification: Full Thickness Without Exposed Support Structu Exudate Amount: Medium Exudate Type: Serosanguineous Exudate Color: red, brown res Foul Odor After Cleansing: No Wound Bed Granulation Amount: Small (1-33%) Exposed Structure Granulation Quality: Red Fascia Exposed: No Necrotic Amount: Large (67-100%) Fat Layer (Subcutaneous Tissue) Exposed: Yes Necrotic Quality: Eschar Tendon Exposed: No Muscle Exposed: No Joint Exposed: No Bone Exposed: No Assessment Notes former surgery site friction/abrasion appeared to have formed with friction wearing boot he stated Treatment Notes Wound #6 (Ankle) Wound Laterality: Left, Posterior Cleanser Soap and Water Brian HamsWALKER, [name] ([id number]) Discharge Instruction: Gently cleanse wound with antibacterial soap, rinse and pat dry prior to dressing wounds Wound Cleanser Discharge Instruction: Wash your hands with soap and water. Remove old dressing, discard into plastic bag and place into trash. Cleanse the wound with Wound Cleanser prior to applying a clean dressing using gauze sponges, not tissues or cotton balls. Do not scrub or use excessive force. Pat dry using gauze sponges, not tissue or cotton balls. Peri-Wound Care Topical Primary Dressing Silvercel Small 2x2 (in/in) Discharge Instruction: Apply Silvercel Small 2x2 (in/in) as  instructed Secondary Dressing Coverlet Latex-Free Fabric Adhesive Dressings Discharge Instruction: 1.5 x 2 Secured With Compression Wrap Compression Stockings Add-Ons Electronic Signature(s) Signed: 04/27/2021 11:13:00 AM By: Brian Feinstein Entered By: Brian Feinstein on 04/27/2021 09:26:38 [name] ([id number]) -------------------------------------------------------------------------------- Vitals Details Patient Name: [name] A. Date of Service: 04/27/2021 8:45 AM Medical Record Number: [id number] Patient Account Number: [id number] Date of Birth/Sex: 12-Jun-1991 (30 y.o. M) Treating RN: Brian Feinstein Primary Care Ryah Cribb: Lilyan Punt Other Clinician: Referring Findley Blankenbaker: Lilyan Punt Treating Towana Stenglein/Extender: Tilda Franco in Treatment: 22 Vital Signs Time Taken: 08:55 Temperature (F): 97.7 Height (in): 72 Pulse (bpm): 64 Weight (lbs): 185 Respiratory Rate (breaths/min): 16 Body Mass Index (BMI): 25.1 Blood Pressure (mmHg): 129/80 Reference Range: 80 - 120 mg / dl Electronic Signature(s) Signed: 04/27/2021 11:13:00 AM By: Brian Feinstein Entered ByHansel Feinstein on 04/27/2021 08:57:12

## 2021-04-27 NOTE — Progress Notes (Signed)
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 wound measures 0.2cm length x 0.2cm width x 0.2cm depth; 0.031cm^2 area and 0.006cm^3 volume. There is Fat Layer (Subcutaneous Tissue) exposed. There is no tunneling or undermining noted. There is a medium amount of serosanguineous drainage noted. There is large (67-100%) red, pink granulation within the wound bed. There is a small (1-33%) amount of necrotic tissue within the wound bed including Adherent Slough. Wound #6 status is Open. Original cause of wound was Gradually Appeared. The date acquired was: 04/13/2021. The wound is located on the Left,Posterior Ankle. The wound measures 0.3cm length x 0.1cm width x 0.1cm depth; 0.024cm^2 area and 0.002cm^3 volume. There is Fat Layer (Subcutaneous Tissue) exposed. There is no tunneling or undermining noted. There is a medium amount of serosanguineous drainage noted. There is small (1-33%) red granulation within the wound bed. There is a large (67-100%) amount of necrotic tissue within the wound bed including Eschar. General Notes: former  surgery site friction/abrasion appeared to have formed with friction wearing boot he stated Assessment Active Problems ICD-10 Non-pressure chronic ulcer of other part of left foot with other specified severity Disruption of external operation (surgical) wound, not elsewhere classified, subsequent encounter Tinea pedis Unspecified open wound, left lower leg, initial encounter [name], [name] ([id number]) Patient's left dorsal foot wound is stable. I recommended continuing collagen to this area. To the surrounding skin this appears to be tenia pedis and I recommended ketoconazole once daily. He now has a new  small wound to the distal aspect of a previous surgical site. This was likely caused by a surgical boot rubbing against the area. Luckily he is no longer needing the boot and has reported improvement with silver alginate. I recommended continuing this. Follow-up in 2 weeks. Patient knows to call with any questions or concerns and can be seen sooner. Plan Follow-up Appointments: Return Appointment in 2 weeks. Nurse Visit as needed Bathing/ Shower/ Hygiene: May shower; gently cleanse wound with antibacterial soap, rinse and pat dry prior to dressing wounds Edema Control - Lymphedema / Segmental Compressive Device / Other: Patient to wear own compression stockings. Remove compression stockings every night before going to bed and put on every morning when getting up. - Reorder compression sock to use as advised-[employer] or [employer] Elevate leg(s) parallel to the floor when sitting. DO YOUR BEST to sleep in the bed at night. DO NOT sleep in your recliner. Long hours of sitting in a recliner leads to swelling of the legs and/or potential wounds on your backside. Off-Loading: Offloading felt to foot. Other: - keep pressure off of wounded area Additional Orders / Instructions: Follow Nutritious Diet and Increase Protein Intake Medications-Please add to medication list.: Other: - Pick  up antifungal cream at pharmacy and start it as directed to the redness on the top of your foot; follow directions The following medication(s) was prescribed: ketoconazole topical 2 % cream 1 1 application daily starting 04/27/2021 WOUND #1: - Foot Wound Laterality: Left, Medial Cleanser: Soap and Water 1 x Per Day/30 Days Discharge Instructions: Gently cleanse wound with antibacterial soap, rinse and pat dry prior to dressing wounds Primary Dressing: Prisma 4.34 (in) (Generic) 1 x Per Day/30 Days Discharge Instructions: Moisten with KY jelly/hydrogel; Cover wound as directed. Do not remove from wound bed. Secondary Dressing: Gauze (DME) (Generic) 1 x Per Day/30 Days Secured With: 5M Medipore H Soft Cloth Surgical Tape, 2x2 (in/yd) (DME) (Generic) 1 x Per Day/30 Days Secured With: Tubigrip Size E, 3.5x10 (in/yds) (DME) (Generic) 1 x Per Day/30 Days Discharge Instructions: Apply 3 Tubigrip E 3-finger-widths below knee to base of toes to secure dressing and/or for swelling. WOUND #6: - Ankle Wound Laterality: Left, Posterior Cleanser: Soap and Water 1 x Per Day/30 Days Discharge Instructions: Gently cleanse wound with antibacterial soap, rinse and pat dry prior to dressing wounds Cleanser: Wound Cleanser (DME) (Generic) 1 x Per Day/30 Days Discharge Instructions: Wash your hands with soap and water. Remove old dressing, discard into plastic bag and place into trash. Cleanse the wound with Wound Cleanser prior to applying a clean dressing using gauze sponges, not tissues or cotton balls. Do not scrub or use excessive force. Pat dry using gauze sponges, not tissue or cotton balls. Primary Dressing: Silvercel Small 2x2 (in/in) (DME) (Generic) 1 x Per Day/30 Days Discharge Instructions: Apply Silvercel Small 2x2 (in/in) as instructed Secondary Dressing: Coverlet Latex-Free Fabric Adhesive Dressings (DME) (Generic) 1 x Per Day/30 Days Discharge Instructions: 1.5 x 2 1. Continue collagen to the dorsal  left foot wound 2. Continue silver alginate to the posterior left leg wound 3. Ketoconazole to the left dorsal foot 4. Follow-up in 2 weeks Electronic Signature(s) Signed: 04/27/2021 9:49:45 AM By: Geralyn Corwin DO Entered By: Geralyn Corwin on 04/27/2021 09:49:10 [name] ([id number]) -------------------------------------------------------------------------------- ROS/PFSH Details Patient Name: Brian Hams A. Date of Service: 04/27/2021 8:45 AM Medical Record Number: [id number] Patient Account Number: [id number] Date of Birth/Sex: [birth year] (30 y.o. M) Treating RN: Hansel Feinstein Primary Care Provider: Gerda Diss,  Scott Other Clinician: Referring Provider: Lilyan Punt Treating Provider/Extender: Tilda Franco in Treatment: 22 Information Obtained From Patient Endocrine Medical History: Negative for: Type I Diabetes; Type II Diabetes Neurologic Medical History: Positive for: Neuropathy Immunizations Pneumococcal Vaccine: Received Pneumococcal Vaccination: No Implantable Devices None Hospitalization / Surgery History Type of Hospitalization/Surgery January 22, 2020 Family and Social History Former smoker - tobacco; Marital Status - Single; Alcohol Use: Rarely; Drug Use: Prior History; Caffeine Use: Daily Electronic Signature(s) Signed: 04/27/2021 9:49:45 AM By: Geralyn Corwin DO Signed: 04/27/2021 11:13:00 AM By: Hansel Feinstein Entered By: Geralyn Corwin on 04/27/2021 09:48:54 [name] ([id number]) -------------------------------------------------------------------------------- SuperBill Details Patient Name: Brian Hams A. Date of Service: 04/27/2021 Medical Record Number: [id number] Patient Account Number: [id number] Date of Birth/Sex: 1990/09/15 (30 y.o. M) Treating RN: Hansel Feinstein Primary Care Provider: Lilyan Punt Other Clinician: Referring Provider: Lilyan Punt Treating Provider/Extender: Tilda Franco in Treatment: 22 Diagnosis  Coding ICD-10 Codes Code Description [phone] Non-pressure chronic ulcer of other part of left foot with other specified severity T81.31XD Disruption of external operation (surgical) wound, not elsewhere classified, subsequent encounter B35.3 Tinea pedis S81.802A Unspecified open wound, left lower leg, initial encounter Facility Procedures CPT4 Code: [id number] Description: 99213 - WOUND CARE VISIT-LEV 3 EST PT Modifier: Quantity: 1 Physician Procedures CPT4 Code Description: [id number] 99213 - WC PHYS LEVEL 3 - EST PT Modifier: Quantity: 1 CPT4 Code Description: ICD-10 Diagnosis Description L97.528 Non-pressure chronic ulcer of other part of left foot with other specif S81.802A Unspecified open wound, left lower leg, initial encounter B35.3 Tinea pedis T81.31XD Disruption of external  operation (surgical) wound, not elsewhere classi Modifier: ied severity fied, subsequent enco Quantity: Printmaker) Signed: 04/27/2021 9:49:45 AM By: Geralyn Corwin DO Entered By: Geralyn Corwin on 04/27/2021 09:49:27

## 2021-04-28 ENCOUNTER — Ambulatory Visit (HOSPITAL_COMMUNITY): Payer: 59 | Admitting: Physical Therapy

## 2021-04-29 ENCOUNTER — Other Ambulatory Visit: Payer: Self-pay

## 2021-04-29 ENCOUNTER — Ambulatory Visit (HOSPITAL_COMMUNITY): Payer: 59 | Admitting: Physical Therapy

## 2021-04-29 ENCOUNTER — Encounter (HOSPITAL_COMMUNITY): Payer: Self-pay | Admitting: Physical Therapy

## 2021-04-29 DIAGNOSIS — R29898 Other symptoms and signs involving the musculoskeletal system: Secondary | ICD-10-CM

## 2021-04-29 DIAGNOSIS — M6281 Muscle weakness (generalized): Secondary | ICD-10-CM

## 2021-04-29 DIAGNOSIS — R262 Difficulty in walking, not elsewhere classified: Secondary | ICD-10-CM | POA: Diagnosis not present

## 2021-04-29 DIAGNOSIS — M79662 Pain in left lower leg: Secondary | ICD-10-CM
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
## 2021-05-03 ENCOUNTER — Telehealth (HOSPITAL_COMMUNITY): Payer: Self-pay | Admitting: Physical Therapy

## 2021-05-03 ENCOUNTER — Encounter (HOSPITAL_COMMUNITY): Payer: 59 | Admitting: Physical Therapy

## 2021-05-03 NOTE — Telephone Encounter (Signed)
NS called and left VM about missed apt and next apt.  9:40 AM, 05/03/21 Tereasa Coop, DPT Physical Therapy with [facility]  [phone] office

## 2021-05-05 ENCOUNTER — Other Ambulatory Visit: Payer: Self-pay

## 2021-05-05 ENCOUNTER — Ambulatory Visit (HOSPITAL_COMMUNITY): Payer: 59 | Admitting: Physical Therapy

## 2021-05-05 DIAGNOSIS — R29898 Other symptoms and signs involving the musculoskeletal system: Secondary | ICD-10-CM | POA: Diagnosis not present

## 2021-05-05 DIAGNOSIS — M79662 Pain in left lower leg: Secondary | ICD-10-CM

## 2021-05-05 DIAGNOSIS — M6281 Muscle weakness (generalized): Secondary | ICD-10-CM

## 2021-05-05 DIAGNOSIS — R262 Difficulty in walking, not elsewhere classified: Secondary | ICD-10-CM | POA: Diagnosis not present
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
## 2021-05-10 ENCOUNTER — Encounter (HOSPITAL_COMMUNITY): Payer: Self-pay | Admitting: Physical Therapy

## 2021-05-10 ENCOUNTER — Other Ambulatory Visit: Payer: Self-pay

## 2021-05-10 ENCOUNTER — Ambulatory Visit (HOSPITAL_COMMUNITY): Payer: 59 | Admitting: Physical Therapy

## 2021-05-10 DIAGNOSIS — M79662 Pain in left lower leg: Secondary | ICD-10-CM | POA: Diagnosis not present

## 2021-05-10 DIAGNOSIS — R262 Difficulty in walking, not elsewhere classified: Secondary | ICD-10-CM | POA: Diagnosis not present

## 2021-05-10 DIAGNOSIS — R29898 Other symptoms and signs involving the musculoskeletal system: Secondary | ICD-10-CM | POA: Diagnosis not present

## 2021-05-10 DIAGNOSIS — M6281 Muscle weakness (generalized): Secondary | ICD-10-CM | POA: Diagnosis not present
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
## 2021-05-11 ENCOUNTER — Other Ambulatory Visit (HOSPITAL_COMMUNITY): Payer: Self-pay

## 2021-05-11 ENCOUNTER — Ambulatory Visit: Payer: 59 | Admitting: Family Medicine

## 2021-05-11 ENCOUNTER — Encounter (HOSPITAL_BASED_OUTPATIENT_CLINIC_OR_DEPARTMENT_OTHER): Payer: 59 | Admitting: Internal Medicine

## 2021-05-11 DIAGNOSIS — T8131XD Disruption of external operation (surgical) wound, not elsewhere classified, subsequent encounter: Secondary | ICD-10-CM

## 2021-05-11 DIAGNOSIS — L97528 Non-pressure chronic ulcer of other part of left foot with other specified severity: Secondary | ICD-10-CM | POA: Diagnosis not present

## 2021-05-11 DIAGNOSIS — B353 Tinea pedis: Secondary | ICD-10-CM | POA: Diagnosis not present

## 2021-05-11 DIAGNOSIS — Z87891 Personal history of nicotine dependence: Secondary | ICD-10-CM | POA: Diagnosis not present

## 2021-05-11 DIAGNOSIS — S81802D Unspecified open wound, left lower leg, subsequent encounter: Secondary | ICD-10-CM | POA: Diagnosis not present

## 2021-05-11 DIAGNOSIS — Z89422 Acquired absence of other left toe(s): Secondary | ICD-10-CM | POA: Diagnosis not present

## 2021-05-11 DIAGNOSIS — S81802A Unspecified open wound, left lower leg, initial encounter: Secondary | ICD-10-CM | POA: Diagnosis not present

## 2021-05-11 MED ORDER — TRIAMCINOLONE ACETONIDE 0.1 % EX CREA
1.0000 "application " | TOPICAL_CREAM | CUTANEOUS | 0 refills | Status: DC
  Filled 2021-05-11: qty 454, 90d supply, fill #0

## 2021-05-12 ENCOUNTER — Other Ambulatory Visit (HOSPITAL_COMMUNITY): Payer: Self-pay

## 2021-05-12 ENCOUNTER — Ambulatory Visit (HOSPITAL_COMMUNITY): Payer: 59 | Admitting: Physical Therapy

## 2021-05-17 ENCOUNTER — Telehealth (HOSPITAL_COMMUNITY): Payer: Self-pay

## 2021-05-17 ENCOUNTER — Other Ambulatory Visit (HOSPITAL_COMMUNITY): Payer: Self-pay

## 2021-05-17 ENCOUNTER — Ambulatory Visit (HOSPITAL_COMMUNITY): Payer: 59

## 2021-05-17 DIAGNOSIS — S82252N Displaced comminuted fracture of shaft of left tibia, subsequent encounter for open fracture type IIIA, IIIB, or IIIC with nonunion: Secondary | ICD-10-CM | POA: Diagnosis not present

## 2021-05-17 DIAGNOSIS — M24572 Contracture, left ankle: Secondary | ICD-10-CM | POA: Diagnosis not present

## 2021-05-17 DIAGNOSIS — S98912S Complete traumatic amputation of left foot, level unspecified, sequela: Secondary | ICD-10-CM | POA: Diagnosis not present

## 2021-05-17 DIAGNOSIS — I824Z2 Acute embolism and thrombosis of unspecified deep veins of left distal lower extremity: Secondary | ICD-10-CM | POA: Diagnosis not present

## 2021-05-17 MED ORDER — GABAPENTIN 300 MG PO CAPS
300.0000 mg | ORAL_CAPSULE | Freq: Three times a day (TID) | ORAL | 2 refills | Status: DC
  Filled 2021-05-17 – 2021-06-16 (×2): qty 90, 30d supply, fill #0
  Filled 2021-08-12: qty 90, 30d supply, fill #1
  Filled 2021-11-09: qty 90, 30d supply, fill #2

## 2021-05-17 NOTE — Telephone Encounter (Signed)
Called regarding missed appointment.  8:52 AM, 05/17/21 M. Shary Decamp, PT, DPT Physical Therapist- [place] Office Number: [phone]

## 2021-05-19 ENCOUNTER — Telehealth: Payer: Self-pay | Admitting: Family Medicine

## 2021-05-19 ENCOUNTER — Encounter (HOSPITAL_COMMUNITY): Payer: 59 | Admitting: Physical Therapy

## 2021-05-19 NOTE — Telephone Encounter (Signed)
Pt contacted and verbalized understanding.  

## 2021-05-19 NOTE — Telephone Encounter (Signed)
Patient called yesterday at 3:45 wanting a visit for head congestion,fever,runny nose and was advised to go to Urgent Care . Mom is calling wanting to know what cover the counter medication can he take with him on blood thinners.

## 2021-05-19 NOTE — Telephone Encounter (Signed)
Please advise. Thank you

## 2021-05-19 NOTE — Telephone Encounter (Signed)
Robitussin-DM, Tylenol, Claritin or Zyrtec

## 2021-05-25 ENCOUNTER — Ambulatory Visit: Payer: 59 | Admitting: Internal Medicine

## 2021-05-25 ENCOUNTER — Other Ambulatory Visit (HOSPITAL_COMMUNITY): Payer: Self-pay

## 2021-06-01 ENCOUNTER — Encounter: Payer: 59 | Attending: Internal Medicine | Admitting: Internal Medicine

## 2021-06-01 ENCOUNTER — Encounter: Payer: Self-pay | Admitting: Family Medicine

## 2021-06-01 ENCOUNTER — Ambulatory Visit: Payer: 59 | Admitting: Family Medicine

## 2021-06-01 ENCOUNTER — Other Ambulatory Visit: Payer: Self-pay

## 2021-06-01 VITALS — BP 114/82 | HR 60 | Temp 98.0°F | Ht 71.0 in | Wt 222.0 lb

## 2021-06-01 DIAGNOSIS — N469 Male infertility, unspecified: Secondary | ICD-10-CM | POA: Diagnosis not present

## 2021-06-01 DIAGNOSIS — Z89432 Acquired absence of left foot: Secondary | ICD-10-CM | POA: Diagnosis not present

## 2021-06-01 DIAGNOSIS — B353 Tinea pedis: Secondary | ICD-10-CM | POA: Diagnosis not present

## 2021-06-01 DIAGNOSIS — Z87891 Personal history of nicotine dependence: Secondary | ICD-10-CM | POA: Diagnosis not present

## 2021-06-01 DIAGNOSIS — Y838 Other surgical procedures as the cause of abnormal reaction of the patient, or of later complication, without mention of misadventure at the time of the procedure: Secondary | ICD-10-CM | POA: Insufficient documentation

## 2021-06-01 DIAGNOSIS — T8131XA Disruption of external operation (surgical) wound, not elsewhere classified, initial encounter: Secondary | ICD-10-CM | POA: Diagnosis not present

## 2021-06-01 DIAGNOSIS — B349 Viral infection, unspecified: Secondary | ICD-10-CM

## 2021-06-01 DIAGNOSIS — T8131XD Disruption of external operation (surgical) wound, not elsewhere classified, subsequent encounter: Secondary | ICD-10-CM | POA: Diagnosis not present

## 2021-06-01 DIAGNOSIS — S81802D Unspecified open wound, left lower leg, subsequent encounter: Secondary | ICD-10-CM | POA: Diagnosis not present

## 2021-06-01 DIAGNOSIS — L97528 Non-pressure chronic ulcer of other part of left foot with other specified severity: Secondary | ICD-10-CM | POA: Insufficient documentation

## 2021-06-01 DIAGNOSIS — G8929 Other chronic pain: Secondary | ICD-10-CM

## 2021-06-01 DIAGNOSIS — S81802A Unspecified open wound, left lower leg, initial encounter: Secondary | ICD-10-CM | POA: Diagnosis not present

## 2021-06-01 DIAGNOSIS — M25572 Pain in left ankle and joints of left foot: Secondary | ICD-10-CM | POA: Diagnosis not present
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
## 2021-06-01 NOTE — Progress Notes (Signed)
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 Obtained From Patient Endocrine Medical History: Negative for: Type I Diabetes; Type II Diabetes Neurologic Medical History: Positive for: Neuropathy Immunizations Pneumococcal Vaccine: Received Pneumococcal Vaccination: No Implantable Devices None Hospitalization / Surgery History Type of Hospitalization/Surgery January 22, 2020 Family and Social History Former smoker - tobacco; Marital Status - Single; Alcohol Use: Rarely; Drug Use: Prior History; Caffeine Use: Daily Electronic Signature(s) Signed: 06/01/2021 9:42:39 AM By: Geralyn Corwin DO Signed: 06/01/2021 12:07:24 PM By: Hansel Feinstein Entered By: Geralyn Corwin on 06/01/2021 09:37:43 [name] ([id number]) -------------------------------------------------------------------------------- SuperBill Details Patient Name: Brian Hams A. Date of Service: 06/01/2021 Medical Record Number: [id number] Patient Account Number: [id number] Date of Birth/Sex: [birth year] (30 y.o. M) Treating RN: Hansel Feinstein Primary Care Provider: Lilyan Punt Other  Clinician: Referring Provider: Lilyan Punt Treating Provider/Extender: Tilda Franco in Treatment: 27 Diagnosis Coding ICD-10 Codes Code Description [phone] Non-pressure chronic ulcer of other part of left foot with other specified severity S81.802D Unspecified open wound, left lower leg, subsequent encounter T81.31XD Disruption of external operation (surgical) wound, not elsewhere classified, subsequent encounter B35.3 Tinea pedis Facility Procedures CPT4 Code: [id number] Description: [phone] - WOUND CARE VISIT-LEV 2 EST PT Modifier: Quantity: 1 Physician Procedures CPT4 Code Description: [id number] - WC PHYS LEVEL 3 - EST PT Modifier: Quantity: 1 CPT4 Code Description: ICD-10 Diagnosis Description L97.528 Non-pressure chronic ulcer of other part of left foot with other specif S81.802D Unspecified open wound, left lower leg, subsequent encounter T81.31XD Disruption of external operation (surgical)  wound,  not elsewhere classi B35.3 Tinea pedis Modifier: ied severity fied, subsequent enco Quantity: Printmaker) Signed: 06/01/2021 9:42:39 AM By: Geralyn Corwin DO Entered By: Geralyn Corwin on 06/01/2021 09:42:07

## 2021-06-01 NOTE — Progress Notes (Signed)
Brian, Lee ([id number]) Visit Report for 06/01/2021 Arrival Information Details Patient Name: [name], [name]. Date of Service: 06/01/2021 8:30 AM Medical Record Number: [id number] Patient Account Number: [id number] Date of Birth/Sex: [birth year] (30 y.o. M) Treating RN: Brian Feinstein Primary Care [name]: [name] Other Clinician: Referring [name]: [name] Treating [name]/Extender: [name] in Treatment: 70 Visit Information History Since Last Visit Added or deleted any medications: No Patient Arrived: Crutches Had a fall or experienced change in No Arrival Time: 08:34 activities of daily living that may affect Accompanied By: mom risk of falls: Transfer Assistance: None Hospitalized since last visit: No Patient Identification Verified: Yes Has Dressing in Place as Prescribed: Yes Secondary Verification Process Completed: Yes Pain Present Now: No Patient Has Alerts: Yes Patient Alerts: Patient on Blood Thinner Eliquis NOT DIABETIC Electronic Signature(s) Signed: 06/01/2021 12:07:24 PM By: Brian Feinstein Entered By: Brian Feinstein on 06/01/2021 08:35:10 [name] ([id number]) -------------------------------------------------------------------------------- Clinic Level of Care Assessment Details Patient Name: [name]. Date of Service: 06/01/2021 8:30 AM Medical Record Number: [id number] Patient Account Number: [id number] Date of Birth/Sex: [birth year] (30 y.o. M) Treating RN: Brian Feinstein Primary Care [name]: [name] Other Clinician: Referring Klani Caridi: [name] Treating [name]/Extender: [name] in Treatment: 9 Clinic Level of Care Assessment Items TOOL 4 Quantity Score []  - Use when only an EandM is performed on FOLLOW-UP visit 0 ASSESSMENTS - Nursing Assessment / Reassessment []  - Reassessment of Co-morbidities (includes updates in patient status) 0 []  - 0 Reassessment of Adherence to Treatment  Plan ASSESSMENTS - Wound and Skin Assessment / Reassessment X - Simple Wound Assessment / Reassessment - one wound 1 5 []  - 0 Complex Wound Assessment / Reassessment - multiple wounds X- 1 10 Dermatologic / Skin Assessment (not related to wound area) ASSESSMENTS - Focused Assessment []  - Circumferential Edema Measurements - multi extremities 0 []  - 0 Nutritional Assessment / Counseling / Intervention []  - 0 Lower Extremity Assessment (monofilament, tuning fork, pulses) []  - 0 Peripheral Arterial Disease Assessment (using hand held doppler) ASSESSMENTS - Ostomy and/or Continence Assessment and Care []  - Incontinence Assessment and Management 0 []  - 0 Ostomy Care Assessment and Management (repouching, etc.) PROCESS - Coordination of Care X - Simple Patient / Family Education for ongoing care 1 15 []  - 0 Complex (extensive) Patient / Family Education for ongoing care []  - 0 Staff obtains , Records, Test Results / Process Orders []  - 0 Staff telephones HHA, Nursing Homes / Clarify orders / etc []  - 0 Routine Transfer to another Facility (non-emergent condition) []  - 0 Routine Hospital Admission (non-emergent condition) []  - 0 New Admissions / / Ordering NPWT, Apligraf, etc. []  - 0 Emergency Hospital Admission (emergent condition) X- 1 10 Simple Discharge Coordination []  - 0 Complex (extensive) Discharge Coordination PROCESS - Special Needs []  - Pediatric / Minor Patient Management 0 []  - 0 Isolation Patient Management []  - 0 Hearing / Language / Visual special needs []  - 0 Assessment of Community assistance (transportation, D/C planning, etc.) []  - 0 Additional assistance / Altered mentation []  - 0 Support Surface(s) Assessment (bed, cushion, seat, etc.) INTERVENTIONS - Wound Cleansing / Measurement [name], [name] ( ) X- 1 5 Simple Wound Cleansing - one wound []  - 0 Complex Wound Cleansing - multiple wounds X- 1 5 Wound  Imaging (photographs - any number of wounds) []  - 0 Wound Tracing (instead of photographs) X- 1 5 Simple Wound Measurement - one wound []  - 0  Complex Wound Measurement - multiple wounds INTERVENTIONS - Wound Dressings X - Small Wound Dressing one or multiple wounds 1 10 []  - 0 Medium Wound Dressing one or multiple wounds []  - 0 Large Wound Dressing one or multiple wounds X- 1 5 Application of Medications - topical []  - 0 Application of Medications - injection INTERVENTIONS - Miscellaneous []  - External ear exam 0 []  - 0 Specimen Collection (cultures, biopsies, blood, body fluids, etc.) []  - 0 Specimen(s) / Culture(s) sent or taken to Lab for analysis []  - 0 Patient Transfer (multiple staff / / Similar devices) []  - 0 Simple Staple / Suture removal (25 or less) []  - 0 Complex Staple / Suture removal (26 or more) []  - 0 Hypo / Hyperglycemic Management (close monitor of Blood Glucose) []  - 0 Ankle / Brachial Index (ABI) - do not check if billed separately X- 1 5 Vital Signs Has the patient been seen at the hospital within the last three years: Yes Total Score: 75 Level Of Care: New/Established - Level 2 Electronic Signature(s) Signed: 06/01/2021 12:07:24 PM By: Entered By: on 06/01/2021 09:06:47 ( ) -------------------------------------------------------------------------------- Encounter Discharge Information Details Patient Name: Nurse, adult A. Date of Service: 06/01/2021 8:30 AM Medical Record Number: Patient Account Number: Date of Birth/Sex: [birth year] (30 y.o. M) Treating RN: Brian Feinstein Primary Care Lenae Wherley: Brian Feinstein Other Clinician: Referring Tonika Eden: 08/01/2021 Treating Kross Swallows/Extender: [name] in Treatment: 39 Encounter Discharge Information Items Discharge Condition: Stable Ambulatory Status: Crutches Discharge Destination:  Home Transportation: Private Auto Accompanied By: mom Schedule Follow-up Appointment: Yes Clinical Summary of Care: Electronic Signature(s) Signed: 06/01/2021 12:07:24 PM By: 08/01/2021 Entered By: [id number] on 06/01/2021 09:07:43 06/25/1991 (05-01-2000) -------------------------------------------------------------------------------- Lower Extremity Assessment Details Patient Name: Brian Feinstein A. Date of Service: 06/01/2021 8:30 AM Medical Record Number: [name] Patient Account Number: [name] Date of Birth/Sex: 1991/08/12 (30 y.o. M) Treating RN: Brian Feinstein Primary Care Thana Ramp: Brian Feinstein Other Clinician: Referring Macalister Arnaud: 08/01/2021 Treating Abrie Egloff/Extender: [name] in Treatment: 27 Edema Assessment Assessed: [Left: Yes] [Right: No] Edema: [Left: N] [Right: o] Electronic Signature(s) Signed: 06/01/2021 12:07:24 PM By: Brian Hams Entered By: 08/01/2021 on 06/01/2021 08:44:16 [id number] (06/25/1991) -------------------------------------------------------------------------------- Multi Wound Chart Details Patient Name: 05-01-2000 A. Date of Service: 06/01/2021 8:30 AM Medical Record Number: [name] Patient Account Number: [name] Date of Birth/Sex: [birth year] (30 y.o. M) Treating RN: Brian Feinstein Primary Care Ella Golomb: Brian Feinstein Other Clinician: Referring Ilyanna Baillargeon: 08/01/2021 Treating Ansley Stanwood/Extender: [name] in Treatment: 66 Vital Signs Height(in): 72 Pulse(bpm): 87 Weight(lbs): 185 Blood Pressure(mmHg): 131/86 Body Mass Index(BMI): 25 Temperature(F): 98.2 Respiratory Rate(breaths/min): 16 Photos: [N/A:N/A] Wound Location: Left, Posterior Ankle N/A N/A Wounding Event: Gradually Appeared N/A N/A Primary Etiology: Abrasion N/A N/A Comorbid History: Neuropathy N/A N/A Date Acquired: 04/13/2021 N/A N/A Weeks of Treatment: 5 N/A N/A Wound Status: Healed - Epithelialized N/A  N/A Measurements L x W x D (cm) 0x0x0 N/A N/A Area (cm) : 0 N/A N/A Volume (cm) : 0 N/A N/A % Reduction in Area: 100.00% N/A N/A % Reduction in Volume: 100.00% N/A N/A Classification: Full Thickness Without Exposed N/A N/A Support Structures Exudate Amount: None Present N/A N/A Granulation Amount: None Present (0%) N/A N/A Necrotic Amount: None Present (0%) N/A N/A Exposed Structures: Fascia: No N/A N/A Fat Layer (Subcutaneous Tissue): No Tendon: No Muscle: No Joint: No Bone: No Treatment Notes Electronic Signature(s) Signed: 06/01/2021 9:42:39 AM By:  Geralyn Corwin DO Entered By: Geralyn Corwin on 06/01/2021 09:36:26 [name] ([id number]) -------------------------------------------------------------------------------- Multi-Disciplinary Care Plan Details Patient Name: [name], PIMENTA. Date of Service: 06/01/2021 8:30 AM Medical Record Number: [id number] Patient Account Number: [id number] Date of Birth/Sex: 1991/05/30 (30 y.o. M) Treating RN: Brian Feinstein Primary Care Porscha Axley: [name] Other Clinician: Referring Seferina Brokaw: [name] Treating Salbador Fiveash/Extender: [name] in Treatment: 51 Active Inactive Electronic Signature(s) Signed: 06/01/2021 12:07:24 PM By: Brian Feinstein Entered By: Brian Feinstein on 06/01/2021 08:44:25 [name] ([id number]) -------------------------------------------------------------------------------- Pain Assessment Details Patient Name: Brian Hams A. Date of Service: 06/01/2021 8:30 AM Medical Record Number: [id number] Patient Account Number: [id number] Date of Birth/Sex: 1990/10/15 (30 y.o. M) Treating RN: Brian Feinstein Primary Care Anntonette Madewell: [name] Other Clinician: Referring Manuela Halbur: [name] Treating Jacqueline Spofford/Extender: [name] in Treatment: 27 Active Problems Location of Pain Severity and Description of Pain Patient Has Paino No Site Locations Rate the pain. Current Pain  Level: 0 Pain Management and Medication Current Pain Management: Electronic Signature(s) Signed: 06/01/2021 12:07:24 PM By: Brian Feinstein Entered By: Brian Feinstein on 06/01/2021 08:38:16 [name] ([id number]) -------------------------------------------------------------------------------- Patient/Caregiver Education Details Patient Name: Brian Hams A. Date of Service: 06/01/2021 8:30 AM Medical Record Number: [id number] Patient Account Number: [id number] Date of Birth/Gender: Jul 29, 1991 (30 y.o. M) Treating RN: Brian Feinstein Primary Care Physician: [name] Other Clinician: Referring Physician: Lilyan Lee Treating Physician/Extender: [name] in Treatment: 53 Education Assessment Education Provided To: Patient Education Topics Provided Basic Hygiene: Wound/Skin Impairment: Electronic Signature(s) Signed: 06/01/2021 12:07:24 PM By: Brian Feinstein Entered By: Brian Feinstein on 06/01/2021 08:54:18 [name] ([id number]) -------------------------------------------------------------------------------- Wound Assessment Details Patient Name: Brian Hams A. Date of Service: 06/01/2021 8:30 AM Medical Record Number: [id number] Patient Account Number: [id number] Date of Birth/Sex: 02-08-1991 (30 y.o. M) Treating RN: Brian Feinstein Primary Care Luverna Degenhart: [name] Other Clinician: Referring Armistead Sult: [name] Treating Rhonda Vangieson/Extender: [name] in Treatment: 27 Wound Status Wound Number: 6 Primary Etiology: Abrasion Wound Location: Left, Posterior Ankle Wound Status: Healed - Epithelialized Wounding Event: Gradually Appeared Comorbid History: Neuropathy Date Acquired: 04/13/2021 Weeks Of Treatment: 5 Clustered Wound: No Photos Wound Measurements Length: (cm) 0 Width: (cm) 0 Depth: (cm) 0 Area: (cm) 0 Volume: (cm) 0 % Reduction in Area: 100% % Reduction in Volume: 100% Wound Description Classification: Full Thickness  Without Exposed Support Structures Exudate Amount: None Present Foul Odor After Cleansing: No Slough/Fibrino No Wound Bed Granulation Amount: None Present (0%) Exposed Structure Necrotic Amount: None Present (0%) Fascia Exposed: No Fat Layer (Subcutaneous Tissue) Exposed: No Tendon Exposed: No Muscle Exposed: No Joint Exposed: No Bone Exposed: No Electronic Signature(s) Signed: 06/01/2021 12:07:24 PM By: Brian Feinstein Entered By: Brian Feinstein on 06/01/2021 08:55:11 [name] ([id number]) -------------------------------------------------------------------------------- Vitals Details Patient Name: Brian Hams A. Date of Service: 06/01/2021 8:30 AM Medical Record Number: [id number] Patient Account Number: [id number] Date of Birth/Sex: 1991/03/01 (30 y.o. M) Treating RN: Brian Feinstein Primary Care Callyn Severtson: [name] Other Clinician: Referring Roshon Duell: [name] Treating Jamerson Vonbargen/Extender: [name] in Treatment: 27 Vital Signs Time Taken: 08:35 Temperature (F): 98.2 Height (in): 72 Pulse (bpm): 87 Weight (lbs): 185 Respiratory Rate (breaths/min): 16 Body Mass Index (BMI): 25.1 Blood Pressure (mmHg): 131/86 Reference Range: 80 - 120 mg / dl Electronic Signature(s) Signed: 06/01/2021 12:07:24 PM By: Brian Feinstein Entered ByHansel Feinstein on 06/01/2021 08:38:07

## 2021-06-02 NOTE — Progress Notes (Signed)
06/02/21-Contacted [facility] (they have moved to [facility] [address] They do test for fertility issues. Please advise. Thank you  [address] [place]  [phone] [phone]-Fax

## 2021-06-06 NOTE — Addendum Note (Signed)
Addended by: Marlowe Shores on: 06/06/2021 09:24 AM   Modules accepted: Orders

## 2021-06-06 NOTE — Progress Notes (Signed)
Pt contacted and verbalized understanding. Referral placed 

## 2021-06-16 ENCOUNTER — Other Ambulatory Visit (HOSPITAL_COMMUNITY): Payer: Self-pay

## 2021-06-29 ENCOUNTER — Ambulatory Visit: Payer: 59 | Admitting: Internal Medicine

## 2021-07-06 ENCOUNTER — Ambulatory Visit: Payer: 59 | Admitting: Internal Medicine

## 2021-07-18 DIAGNOSIS — Z89432 Acquired absence of left foot: Secondary | ICD-10-CM | POA: Diagnosis not present

## 2021-07-18 DIAGNOSIS — M24573 Contracture, unspecified ankle: Secondary | ICD-10-CM | POA: Diagnosis not present

## 2021-07-18 DIAGNOSIS — S82252D Displaced comminuted fracture of shaft of left tibia, subsequent encounter for closed fracture with routine healing: Secondary | ICD-10-CM | POA: Diagnosis not present

## 2021-07-18 DIAGNOSIS — S82402D Unspecified fracture of shaft of left fibula, subsequent encounter for closed fracture with routine healing: Secondary | ICD-10-CM | POA: Diagnosis not present

## 2021-08-10 ENCOUNTER — Encounter (HOSPITAL_COMMUNITY): Payer: Self-pay

## 2021-08-10 NOTE — Therapy (Signed)
[facility] [place], [place], [address] Phone: [phone]   Fax:  [phone]  Patient Details  Name: [name] MRN: [id number] Date of Birth: 07/30/1991 Referring Provider:  No ref. 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 Patient goals were partially met. Patient is being discharged due to not returning since the last visit.  Toniann Fail, PT 08/10/2021, 11:52 AM  [place], [place], [address] Phone: [phone]   Fax:  [phone]

## 2021-08-11 DIAGNOSIS — S82202D Unspecified fracture of shaft of left tibia, subsequent encounter for closed fracture with routine healing: Secondary | ICD-10-CM | POA: Diagnosis not present

## 2021-08-12 ENCOUNTER — Other Ambulatory Visit (HOSPITAL_COMMUNITY): Payer: Self-pay

## 2021-08-22 ENCOUNTER — Other Ambulatory Visit (HOSPITAL_COMMUNITY): Payer: Self-pay

## 2021-09-01 ENCOUNTER — Other Ambulatory Visit: Payer: Self-pay

## 2021-09-01 ENCOUNTER — Inpatient Hospital Stay (HOSPITAL_COMMUNITY): Payer: 59 | Attending: Hematology

## 2021-09-01 DIAGNOSIS — R718 Other abnormality of red blood cells: Secondary | ICD-10-CM | POA: Diagnosis not present

## 2021-09-01 DIAGNOSIS — Z7289 Other problems related to lifestyle: Secondary | ICD-10-CM | POA: Diagnosis not present

## 2021-09-01 DIAGNOSIS — I2699 Other pulmonary embolism without acute cor pulmonale: Secondary | ICD-10-CM

## 2021-09-01 DIAGNOSIS — F5089 Other specified eating disorder: Secondary | ICD-10-CM | POA: Insufficient documentation

## 2021-09-01 DIAGNOSIS — I824Y2 Acute embolism and thrombosis of unspecified deep veins of left proximal lower extremity: Secondary | ICD-10-CM

## 2021-09-01 DIAGNOSIS — M549 Dorsalgia, unspecified: Secondary | ICD-10-CM | POA: Diagnosis not present

## 2021-09-01 DIAGNOSIS — R2689 Other abnormalities of gait and mobility: Secondary | ICD-10-CM | POA: Insufficient documentation

## 2021-09-01 DIAGNOSIS — Z79899 Other long term (current) drug therapy: Secondary | ICD-10-CM | POA: Diagnosis not present

## 2021-09-01 DIAGNOSIS — Z881 Allergy status to other antibiotic agents status: Secondary | ICD-10-CM | POA: Diagnosis not present

## 2021-09-01 DIAGNOSIS — Z86718 Personal history of other venous thrombosis and embolism: Secondary | ICD-10-CM | POA: Insufficient documentation

## 2021-09-01 DIAGNOSIS — D6862 Lupus anticoagulant syndrome: Secondary | ICD-10-CM | POA: Insufficient documentation

## 2021-09-01 LAB — CBC WITH DIFFERENTIAL/PLATELET
Abs Immature Granulocytes: 0.02 10*3/uL (ref 0.00–0.07)
Basophils Absolute: 0.1 10*3/uL (ref 0.0–0.1)
Basophils Relative: 1 %
Eosinophils Absolute: 0.2 10*3/uL (ref 0.0–0.5)
Eosinophils Relative: 3 %
HCT: 47.5 % (ref 39.0–52.0)
Hemoglobin: 13.7 g/dL (ref 13.0–17.0)
Immature Granulocytes: 0 %
Lymphocytes Relative: 34 %
Lymphs Abs: 2.2 10*3/uL (ref 0.7–4.0)
MCH: 21.5 pg — ABNORMAL LOW (ref 26.0–34.0)
MCHC: 28.8 g/dL — ABNORMAL LOW (ref 30.0–36.0)
MCV: 74.7 fL — ABNORMAL LOW (ref 80.0–100.0)
Monocytes Absolute: 0.5 10*3/uL (ref 0.1–1.0)
Monocytes Relative: 7 %
Neutro Abs: 3.5 10*3/uL (ref 1.7–7.7)
Neutrophils Relative %: 55 %
Platelets: 193 10*3/uL (ref 150–400)
RBC: 6.36 MIL/uL — ABNORMAL HIGH (ref 4.22–5.81)
RDW: 20.2 % — ABNORMAL HIGH (ref 11.5–15.5)
WBC Morphology: REACTIVE
WBC: 6.4 10*3/uL (ref 4.0–10.5)
nRBC: 0 % (ref 0.0–0.2)

## 2021-09-01 LAB — D-DIMER, QUANTITATIVE: D-Dimer, Quant: 0.39 ug/mL-FEU (ref 0.00–0.50)

## 2021-09-02 LAB — CARDIOLIPIN ANTIBODIES, IGG, IGM, IGA
Anticardiolipin IgA: <9 APL U/mL (ref 0–11)
Anticardiolipin IgG: <9 GPL U/mL (ref 0–14)
Anticardiolipin IgM: <9 MPL U/mL (ref 0–12)

## 2021-09-03 LAB — BETA-2-GLYCOPROTEIN I ABS, IGG/M/A
Beta-2 Glyco I IgG: <9 GPI IgG units (ref 0–20)
Beta-2-Glycoprotein I IgA: <9 GPI IgA units (ref 0–25)
Beta-2-Glycoprotein I IgM: <9 GPI IgM units (ref 0–32)

## 2021-09-03 LAB — LUPUS ANTICOAGULANT PANEL
DRVVT: 65.7 s — ABNORMAL HIGH (ref 0.0–47.0)
PTT Lupus Anticoagulant: 41.4 s (ref 0.0–51.9)

## 2021-09-03 LAB — DRVVT CONFIRM: dRVVT Confirm: 1.5 ratio — ABNORMAL HIGH (ref 0.8–1.2)

## 2021-09-03 LAB — DRVVT MIX: dRVVT Mix: 47.8 s — ABNORMAL HIGH (ref 0.0–40.4)

## 2021-09-06 LAB — PROTHROMBIN GENE MUTATION

## 2021-09-06 LAB — FACTOR 5 LEIDEN

## 2021-09-08 ENCOUNTER — Ambulatory Visit (HOSPITAL_COMMUNITY): Payer: 59 | Admitting: Physician Assistant
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
## 2021-09-14 ENCOUNTER — Inpatient Hospital Stay (HOSPITAL_BASED_OUTPATIENT_CLINIC_OR_DEPARTMENT_OTHER): Payer: 59 | Admitting: Physician Assistant

## 2021-09-14 ENCOUNTER — Other Ambulatory Visit: Payer: Self-pay

## 2021-09-14 ENCOUNTER — Inpatient Hospital Stay (HOSPITAL_COMMUNITY): Payer: 59

## 2021-09-14 VITALS — BP 133/89 | HR 63 | Temp 98.1°F | Resp 20 | Ht 72.84 in | Wt 229.3 lb

## 2021-09-14 DIAGNOSIS — Z881 Allergy status to other antibiotic agents status: Secondary | ICD-10-CM | POA: Diagnosis not present

## 2021-09-14 DIAGNOSIS — D509 Iron deficiency anemia, unspecified: Secondary | ICD-10-CM | POA: Diagnosis not present

## 2021-09-14 DIAGNOSIS — F5089 Other specified eating disorder: Secondary | ICD-10-CM | POA: Diagnosis not present

## 2021-09-14 DIAGNOSIS — R2689 Other abnormalities of gait and mobility: Secondary | ICD-10-CM | POA: Diagnosis not present

## 2021-09-14 DIAGNOSIS — I2699 Other pulmonary embolism without acute cor pulmonale: Secondary | ICD-10-CM

## 2021-09-14 DIAGNOSIS — D6862 Lupus anticoagulant syndrome: Secondary | ICD-10-CM | POA: Diagnosis not present

## 2021-09-14 DIAGNOSIS — Z79899 Other long term (current) drug therapy: Secondary | ICD-10-CM | POA: Diagnosis not present

## 2021-09-14 DIAGNOSIS — I824Y2 Acute embolism and thrombosis of unspecified deep veins of left proximal lower extremity: Secondary | ICD-10-CM | POA: Diagnosis not present

## 2021-09-14 DIAGNOSIS — Z7289 Other problems related to lifestyle: Secondary | ICD-10-CM | POA: Diagnosis not present

## 2021-09-14 DIAGNOSIS — M549 Dorsalgia, unspecified: Secondary | ICD-10-CM | POA: Diagnosis not present

## 2021-09-14 DIAGNOSIS — Z86718 Personal history of other venous thrombosis and embolism: Secondary | ICD-10-CM | POA: Diagnosis not present

## 2021-09-14 DIAGNOSIS — R718 Other abnormality of red blood cells: Secondary | ICD-10-CM | POA: Diagnosis not present

## 2021-09-14 LAB — IRON AND TIBC
Iron: 33 ug/dL — ABNORMAL LOW (ref 45–182)
Saturation Ratios: 5 % — ABNORMAL LOW (ref 17.9–39.5)
TIBC: 632 ug/dL — ABNORMAL HIGH (ref 250–450)
UIBC: 599 ug/dL

## 2021-09-14 LAB — FERRITIN: Ferritin: 9 ng/mL — ABNORMAL LOW (ref 24–336)

## 2021-09-14 MED ORDER — FERROUS SULFATE 325 (65 FE) MG PO TBEC: 325.0000 mg | DELAYED_RELEASE_TABLET | Freq: Three times a day (TID) | ORAL | 3 refills | Status: DC
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
## 2021-09-15 ENCOUNTER — Ambulatory Visit (HOSPITAL_COMMUNITY): Payer: 59 | Admitting: Physician Assistant

## 2021-09-15 DIAGNOSIS — S52122K Displaced fracture of head of left radius, subsequent encounter for closed fracture with nonunion: Secondary | ICD-10-CM | POA: Diagnosis not present

## 2021-09-15 DIAGNOSIS — Z4789 Encounter for other orthopedic aftercare: Secondary | ICD-10-CM | POA: Diagnosis not present

## 2021-09-15 DIAGNOSIS — S7292XD Unspecified fracture of left femur, subsequent encounter for closed fracture with routine healing: Secondary | ICD-10-CM | POA: Diagnosis not present

## 2021-09-15 DIAGNOSIS — S82142D Displaced bicondylar fracture of left tibia, subsequent encounter for closed fracture with routine healing: Secondary | ICD-10-CM | POA: Diagnosis not present

## 2021-09-15 DIAGNOSIS — M25422 Effusion, left elbow: Secondary | ICD-10-CM | POA: Diagnosis not present

## 2021-09-15 DIAGNOSIS — S42492D Other displaced fracture of lower end of left humerus, subsequent encounter for fracture with routine healing: Secondary | ICD-10-CM | POA: Diagnosis not present

## 2021-09-15 DIAGNOSIS — S82402D Unspecified fracture of shaft of left fibula, subsequent encounter for closed fracture with routine healing: Secondary | ICD-10-CM | POA: Diagnosis not present

## 2021-09-15 DIAGNOSIS — S72352N Displaced comminuted fracture of shaft of left femur, subsequent encounter for open fracture type IIIA, IIIB, or IIIC with nonunion: Secondary | ICD-10-CM | POA: Diagnosis not present

## 2021-09-15 DIAGNOSIS — S42452K Displaced fracture of lateral condyle of left humerus, subsequent encounter for fracture with nonunion: Secondary | ICD-10-CM | POA: Diagnosis not present

## 2021-09-15 DIAGNOSIS — S7292XK Unspecified fracture of left femur, subsequent encounter for closed fracture with nonunion: Secondary | ICD-10-CM | POA: Diagnosis not present

## 2021-09-15 DIAGNOSIS — S42402S Unspecified fracture of lower end of left humerus, sequela: Secondary | ICD-10-CM | POA: Diagnosis not present

## 2021-09-15 DIAGNOSIS — S82402K Unspecified fracture of shaft of left fibula, subsequent encounter for closed fracture with nonunion: Secondary | ICD-10-CM | POA: Diagnosis not present

## 2021-09-15 DIAGNOSIS — S72352F Displaced comminuted fracture of shaft of left femur, subsequent encounter for open fracture type IIIA, IIIB, or IIIC with routine healing: Secondary | ICD-10-CM | POA: Diagnosis not present

## 2021-09-15 DIAGNOSIS — Z89432 Acquired absence of left foot: Secondary | ICD-10-CM | POA: Diagnosis not present

## 2021-09-15 DIAGNOSIS — S82252N Displaced comminuted fracture of shaft of left tibia, subsequent encounter for open fracture type IIIA, IIIB, or IIIC with nonunion: Secondary | ICD-10-CM | POA: Diagnosis not present

## 2021-09-21 ENCOUNTER — Ambulatory Visit (HOSPITAL_COMMUNITY)
Admission: RE | Admit: 2021-09-21 | Discharge: 2021-09-21 | Disposition: A | Discharge status: Home / Self Care | Payer: 59 | Source: Ambulatory Visit | Attending: Physician Assistant | Admitting: Physician Assistant

## 2021-09-21 ENCOUNTER — Encounter (HOSPITAL_COMMUNITY): Payer: Self-pay | Admitting: Radiology

## 2021-09-21 ENCOUNTER — Other Ambulatory Visit: Payer: Self-pay

## 2021-09-21 DIAGNOSIS — I82412 Acute embolism and thrombosis of left femoral vein: Secondary | ICD-10-CM | POA: Diagnosis not present

## 2021-09-21 DIAGNOSIS — I2699 Other pulmonary embolism without acute cor pulmonale: Secondary | ICD-10-CM | POA: Diagnosis not present

## 2021-09-21 DIAGNOSIS — I824Y2 Acute embolism and thrombosis of unspecified deep veins of left proximal lower extremity: Secondary | ICD-10-CM | POA: Insufficient documentation

## 2021-09-21 DIAGNOSIS — I82432 Acute embolism and thrombosis of left popliteal vein: Secondary | ICD-10-CM | POA: Diagnosis not present

## 2021-09-21 DIAGNOSIS — J9811 Atelectasis: Secondary | ICD-10-CM | POA: Diagnosis not present

## 2021-09-21 MED ORDER — IOHEXOL 350 MG/ML SOLN
100.0000 mL | Freq: Once | INTRAVENOUS | Status: AC | PRN
  Administered 2021-09-21: 80 mL via INTRAVENOUS

## 2021-09-23 ENCOUNTER — Other Ambulatory Visit: Payer: Self-pay

## 2021-09-23 ENCOUNTER — Inpatient Hospital Stay (HOSPITAL_COMMUNITY): Payer: 59 | Attending: Hematology | Admitting: Physician Assistant

## 2021-09-23 DIAGNOSIS — I824Y2 Acute embolism and thrombosis of unspecified deep veins of left proximal lower extremity: Secondary | ICD-10-CM | POA: Diagnosis not present

## 2021-09-23 DIAGNOSIS — D509 Iron deficiency anemia, unspecified: Secondary | ICD-10-CM

## 2021-09-23 DIAGNOSIS — I2699 Other pulmonary embolism without acute cor pulmonale: Secondary | ICD-10-CM | POA: Diagnosis not present
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
## 2021-10-12 ENCOUNTER — Ambulatory Visit (INDEPENDENT_AMBULATORY_CARE_PROVIDER_SITE_OTHER): Payer: 59 | Admitting: Family Medicine

## 2021-10-12 ENCOUNTER — Other Ambulatory Visit: Payer: Self-pay

## 2021-10-12 VITALS — BP 152/96 | HR 63 | Temp 98.3°F | Wt 232.0 lb

## 2021-10-12 DIAGNOSIS — B9789 Other viral agents as the cause of diseases classified elsewhere: Secondary | ICD-10-CM | POA: Diagnosis not present

## 2021-10-12 DIAGNOSIS — J988 Other specified respiratory disorders: Secondary | ICD-10-CM

## 2021-10-12 DIAGNOSIS — J029 Acute pharyngitis, unspecified: Secondary | ICD-10-CM

## 2021-10-12 LAB — POCT RAPID STREP A (OFFICE): Rapid Strep A Screen: NEGATIVE

## 2021-10-12 MED ORDER — LEVOCETIRIZINE DIHYDROCHLORIDE 5 MG PO TABS: 5.0000 mg | ORAL_TABLET | Freq: Every evening | ORAL | 0 refills | Status: DC

## 2021-10-12 MED ORDER — BENZONATATE 200 MG PO CAPS: 200.0000 mg | ORAL_CAPSULE | Freq: Three times a day (TID) | ORAL | 0 refills | Status: DC | PRN

## 2021-10-12 NOTE — Patient Instructions (Signed)
This is a viral respiratory infection.  Strep negative.  Medications as prescribed.  If you worsen or fail to improve, please let us know.  Take care  Dr. Lacinda Axon

## 2021-10-12 NOTE — Assessment & Plan Note (Signed)
Rapid strep negative.  Exam unremarkable.  Given the duration of his illness and the fact that he is clear to auscultation, I believe that this is viral in origin.  Tessalon Perles and Xyzal as prescribed.  If he fails to improve or worsens, he is to let us know.
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
## 2021-10-16 LAB — CULTURE, GROUP A STREP: Strep A Culture: NEGATIVE

## 2021-11-09 ENCOUNTER — Other Ambulatory Visit (HOSPITAL_COMMUNITY): Payer: Self-pay

## 2021-11-09 ENCOUNTER — Other Ambulatory Visit: Payer: Self-pay | Admitting: Family Medicine

## 2021-11-09 NOTE — Telephone Encounter (Signed)
May have this with 2 refills needs to do follow-up visit this spring with me ?

## 2021-11-10 ENCOUNTER — Other Ambulatory Visit (HOSPITAL_COMMUNITY): Payer: Self-pay

## 2021-11-10 MED ORDER — APIXABAN 5 MG PO TABS
5.0000 mg | ORAL_TABLET | Freq: Two times a day (BID) | ORAL | 2 refills | Status: DC
  Filled 2021-11-10: qty 60, 30d supply, fill #0
  Filled 2022-01-04: qty 60, 30d supply, fill #1
  Filled 2022-03-06: qty 60, 30d supply, fill #2

## 2021-11-14 ENCOUNTER — Other Ambulatory Visit (HOSPITAL_COMMUNITY): Payer: Self-pay

## 2021-11-20 IMAGING — DX DG RIBS 2V*L*
2 series · 2 of 2 positions shown · non-contrast
Comparison: None.

CLINICAL DATA: Side pain.

EXAM:
LEFT RIBS - 2 VIEW

[rib pa]
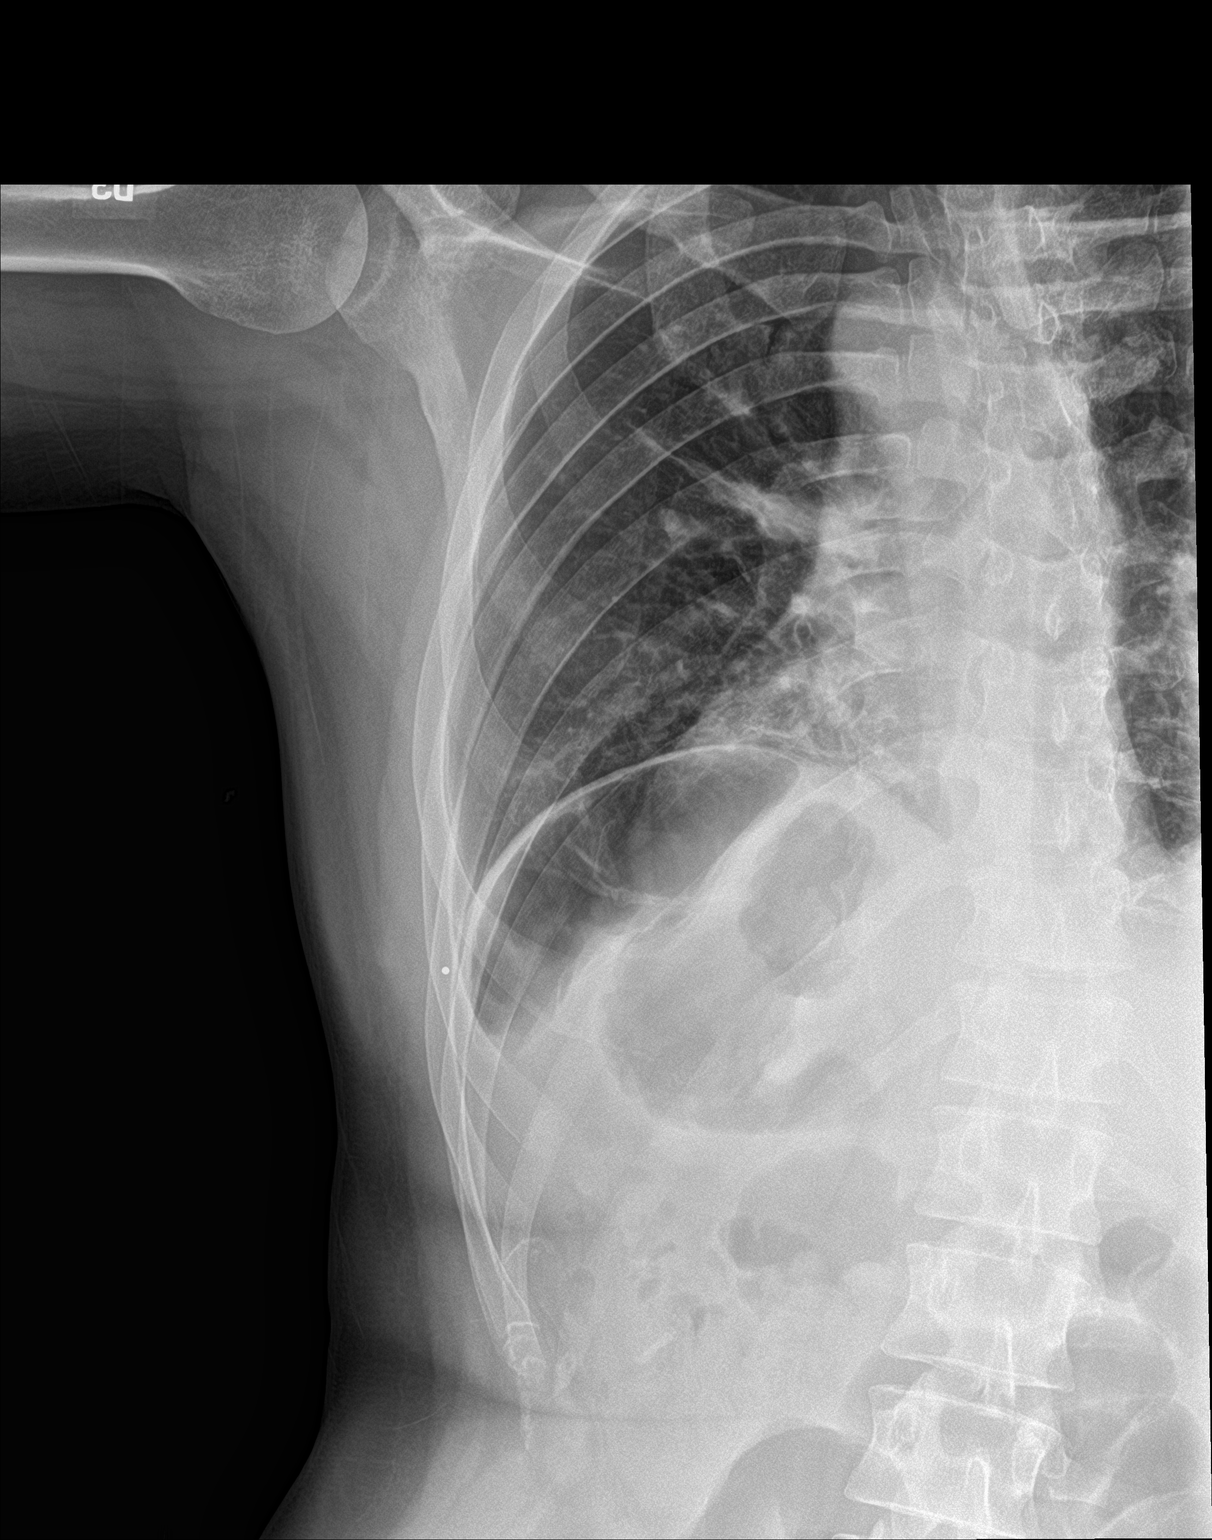

[rib pa obl]
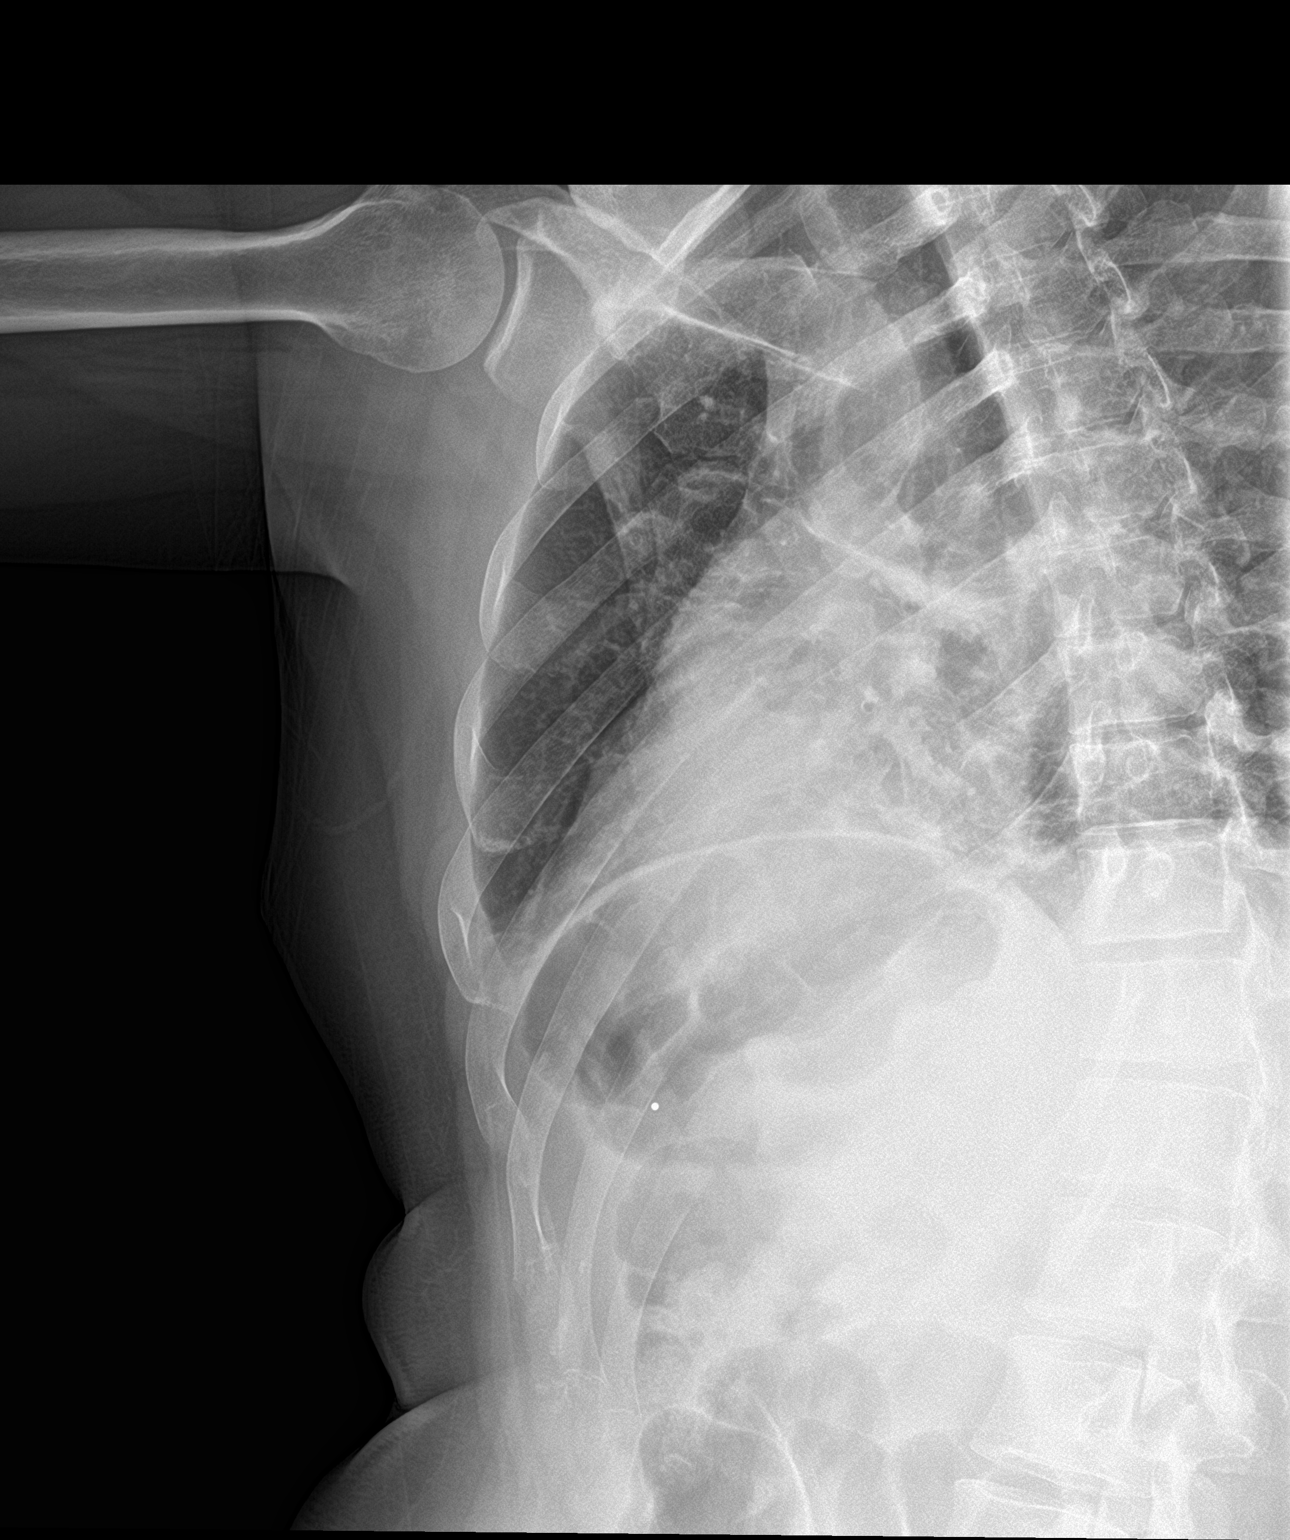

[2 of 2 positions shown; findings below may reference images not displayed]

FINDINGS: No fracture or other bone lesions are seen involving the ribs.
IMPRESSION: Negative.

## 2021-11-21 IMAGING — CT CT ANGIO CHEST
2 of 6 series · 18 of 36 positions shown · IV contrast (omnipaque)
Comparison: Rib radiographs yesterday.

CLINICAL DATA: PE suspected, high prob hemoptysis, fever, cough
left side rib pain

EXAM:
CT ANGIOGRAPHY CHEST WITH CONTRAST
TECHNIQUE: Multidetector CT imaging of the chest was performed using the
standard protocol during bolus administration of intravenous
contrast. Multiplanar CT image reconstructions and MIPs were
obtained to evaluate the vascular anatomy.
CONTRAST:  75mL OMNIPAQUE IOHEXOL 350 MG/ML SOLN

[Series 6: pe axial thins · axial · 0.70mm/px · z∈[+1123,+1397]mm · 17 of 306 slices shown]
[im 16/306  lung]
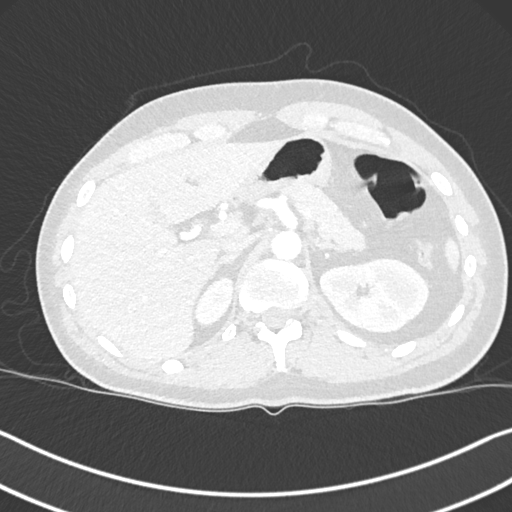
[im 31/306  mediastinal]
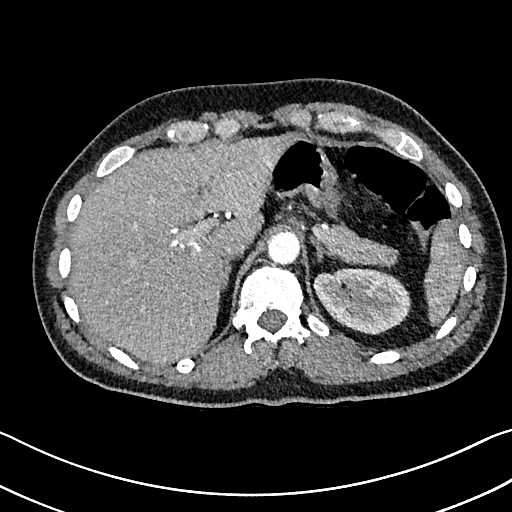
[im 46/306  lung]
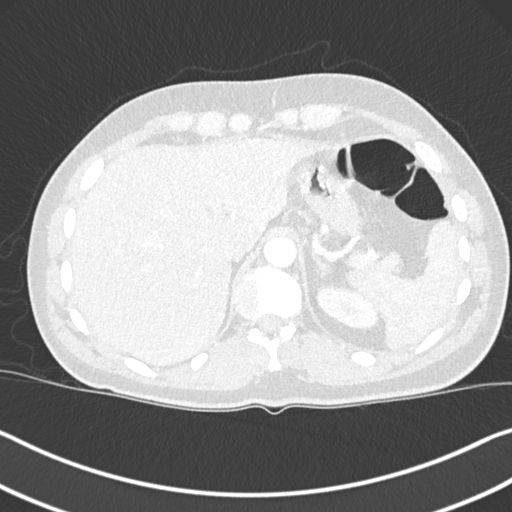
[im 62/306  mediastinal]
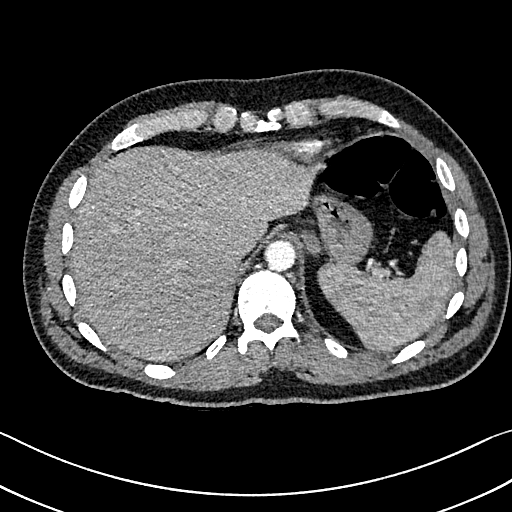
[im 92/306  lung]
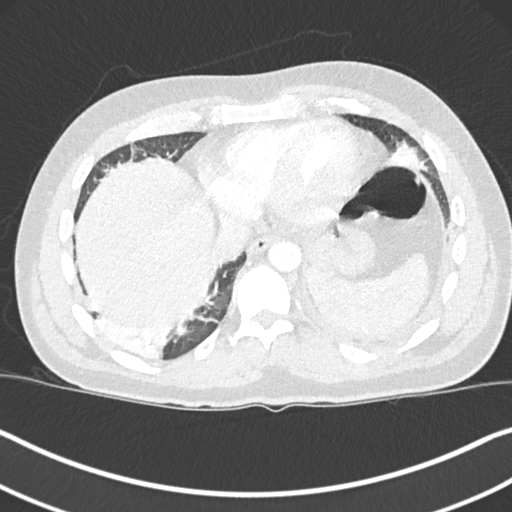
[im 107/306  mediastinal]
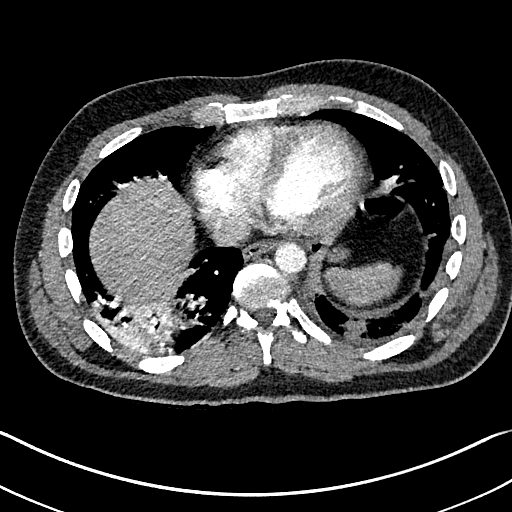
[im 123/306  lung]
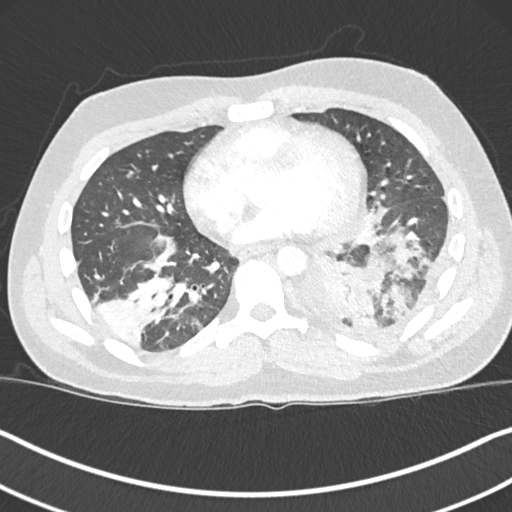
[im 138/306  mediastinal]
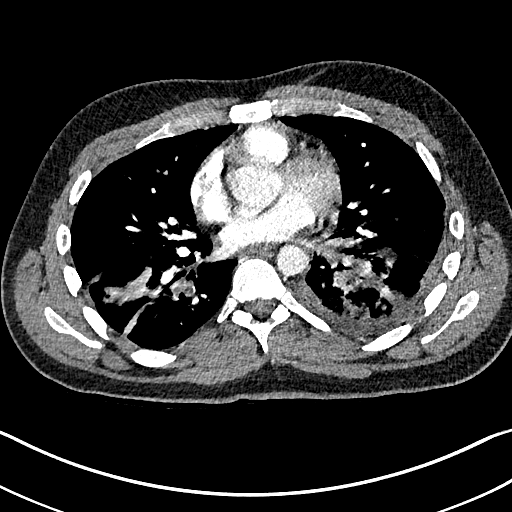
[im 153/306  lung]
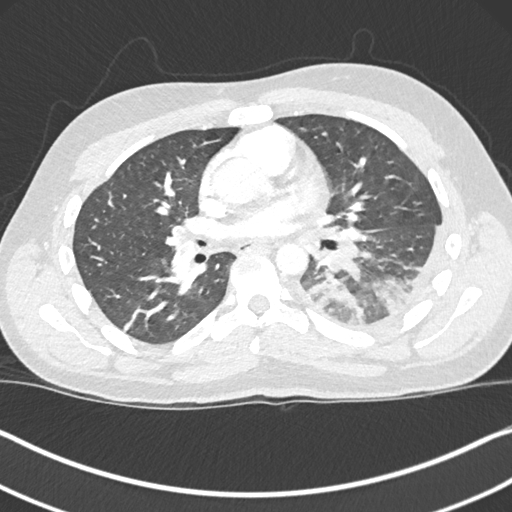
[im 168/306  mediastinal]
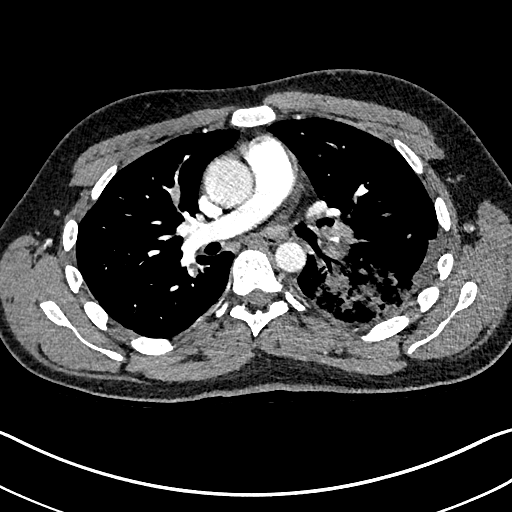
[im 184/306  lung]
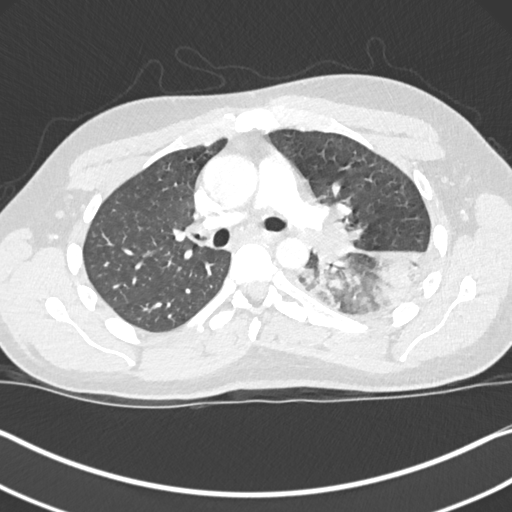
[im 199/306  mediastinal]
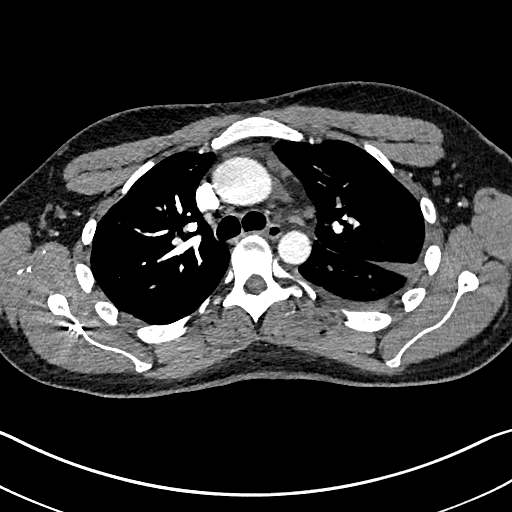
[im 214/306  lung]
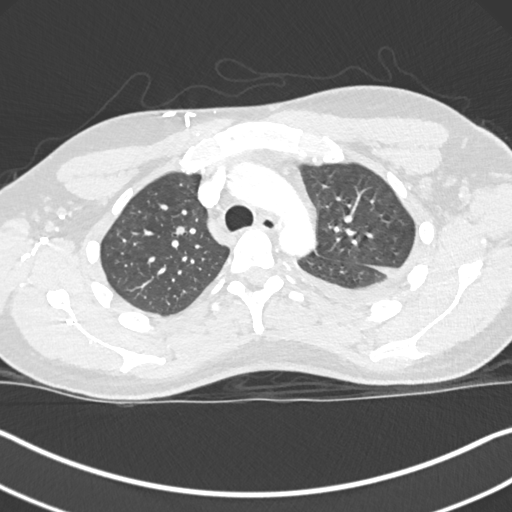
[im 245/306  mediastinal]
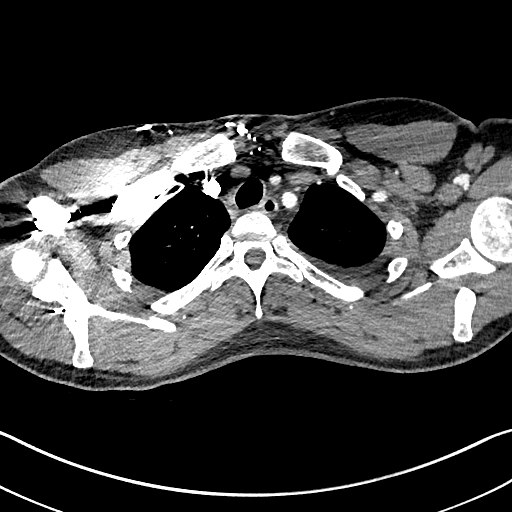
[im 260/306  lung]
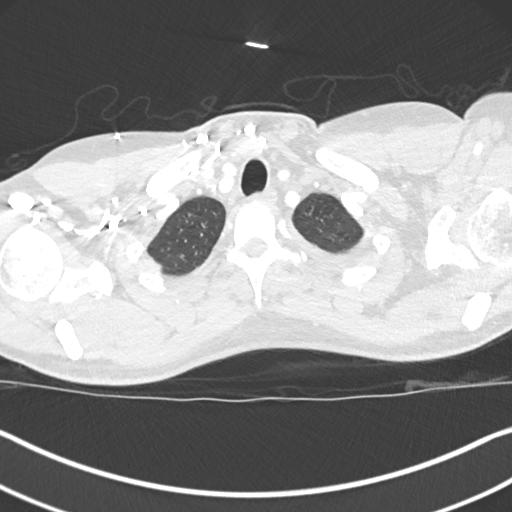
[im 275/306  mediastinal]
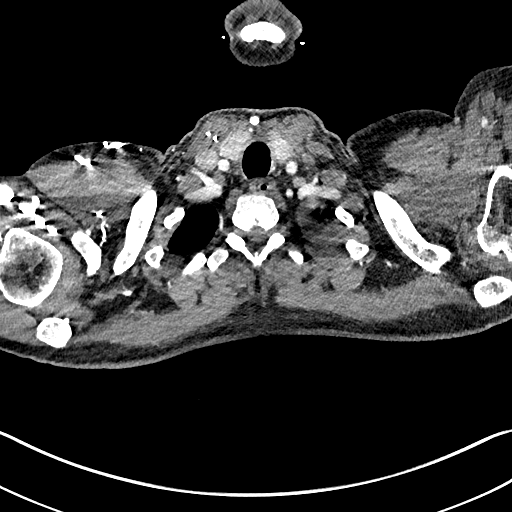
[im 290/306  lung]
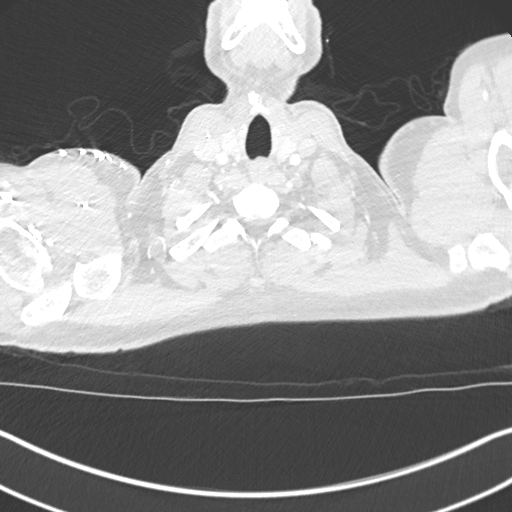

[Series 8: cor soft · coronal · 0.62mm/px · 1 of 105 slices shown]
[im 53/105  mediastinal]
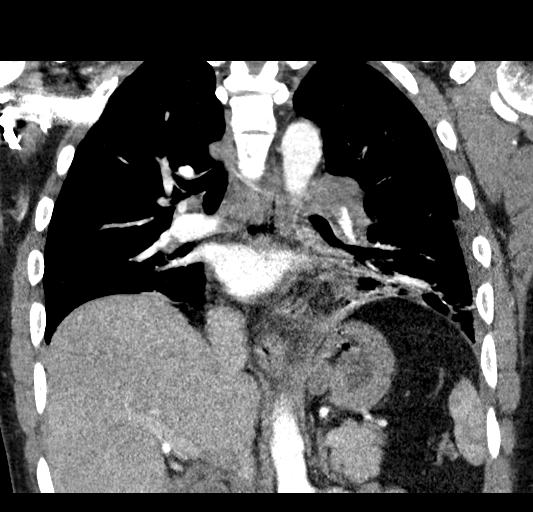

[18 of 36 positions shown; findings below may reference images not displayed]

FINDINGS: Cardiovascular: Positive for acute bilateral pulmonary emboli. On
the left, filling defects involve the distal main pulmonary artery,
many segmental and subsegmental branches in the left lower lobe, and
segmental left upper lobe. On the right filling defects involve the
segmental and subsegmental branches of the upper, middle, and lower
lobes. Thromboembolic burden is moderate. The RV to LV ratio is
0.93. No contrast refluxing into the hepatic veins and IVC. Normal
caliber thoracic aorta without dissection. Heart is normal in size.
No pericardial effusion.

Mediastinum/Nodes: Prominent left hilar lymph node likely reactive.
There are small prevascular nodes not enlarged by size criteria.
Decompressed esophagus. No suspicious thyroid nodule.

Lungs/Pleura: Consolidative and ground-glass left lower lobe
consolidation, may be combination of pneumonia and pulmonary
infarct. Triangular opacity in the right lower lobe is suspicious
for pulmonary infarct. Lingular consolidation may be atelectasis or
infectious. There is a small left pleural effusion that is partially
loculated and tracks into the inter lobar fissure.

Upper Abdomen: No acute or unexpected findings.

Musculoskeletal: There are no acute or suspicious osseous
abnormalities. No left rib fractures.

Review of the MIP images confirms the above findings.
IMPRESSION: 1. Positive for acute bilateral pulmonary emboli with moderate clot
burden. No evidence of right heart strain.
2. Consolidative and ground-glass left lower lobe consolidation, may
be combination of pneumonia and pulmonary infarct. Triangular
opacity in the right lower lobe is suspicious for pulmonary infarct.
Lingular consolidation may be atelectasis, infectious or infarct.
3. Small left pleural effusion that is partially loculated and
tracks into the inter lobar fissure.
4. No left rib fractures.

Critical Value/emergent results were called by telephone at the time
of interpretation on 09/23/2020 at [DATE] to provider NIKUSHA SUVORYN ,
who verbally acknowledged these results.

## 2021-11-22 IMAGING — US US EXTREM LOW VENOUS
1 series · 13 of 24 positions shown · non-contrast
Comparison: None

CLINICAL DATA: Pulmonary embolism, LEFT lower extremity pain

EXAM:
BILATERAL LOWER EXTREMITY VENOUS DOPPLER ULTRASOUND
TECHNIQUE: Gray-scale sonography with compression, as well as color and duplex
ultrasound, were performed to evaluate the deep venous system(s)
from the level of the common femoral vein through the popliteal and
proximal calf veins.

[Series 1: us venous img lower bilat (dvt) · portal-venous · 13 of 70 slices shown]
[im 1/70]
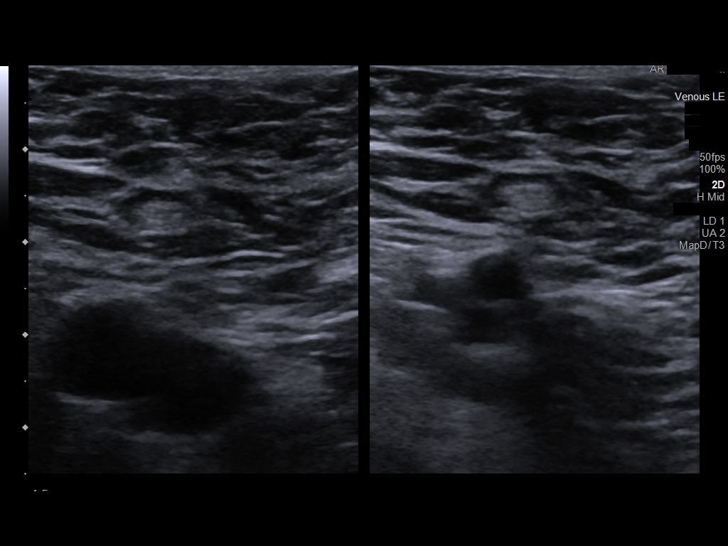
[im 7/70]
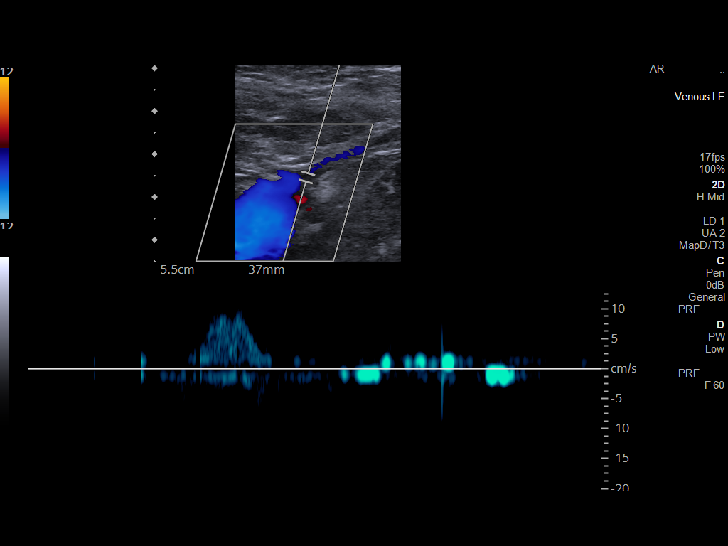
[im 13/70]
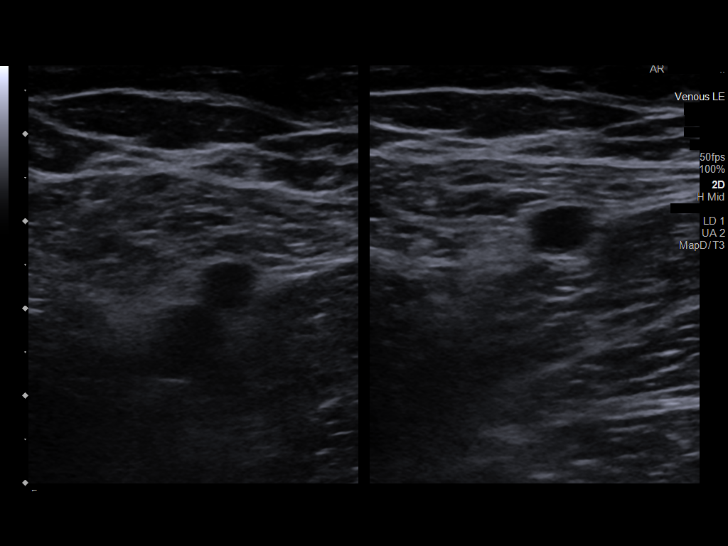
[im 19/70]
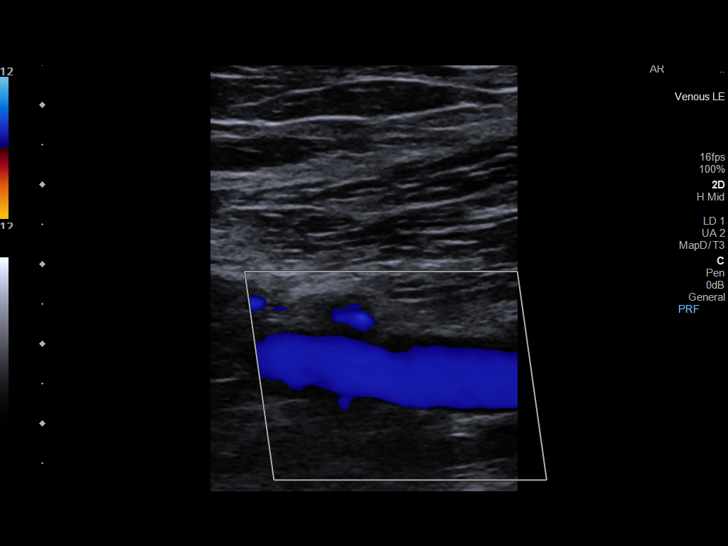
[im 25/70]
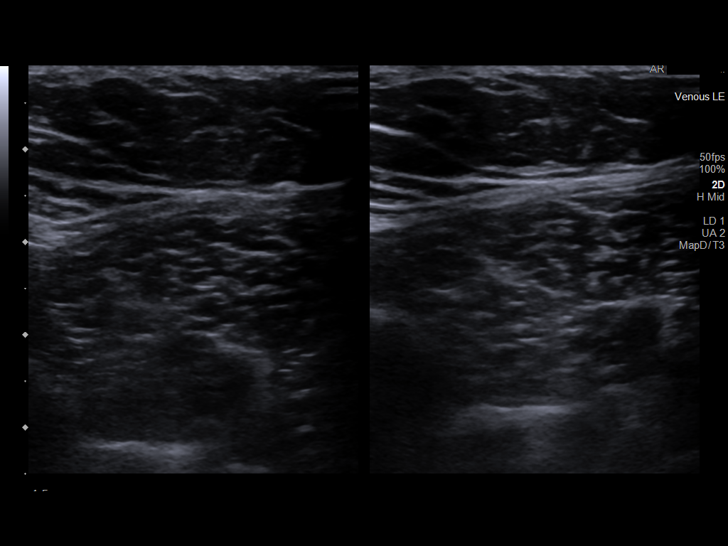
[im 31/70]
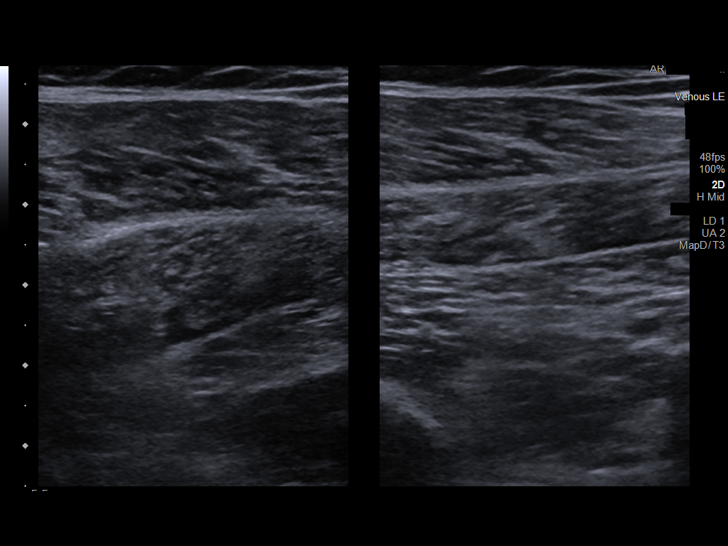
[im 37/70]
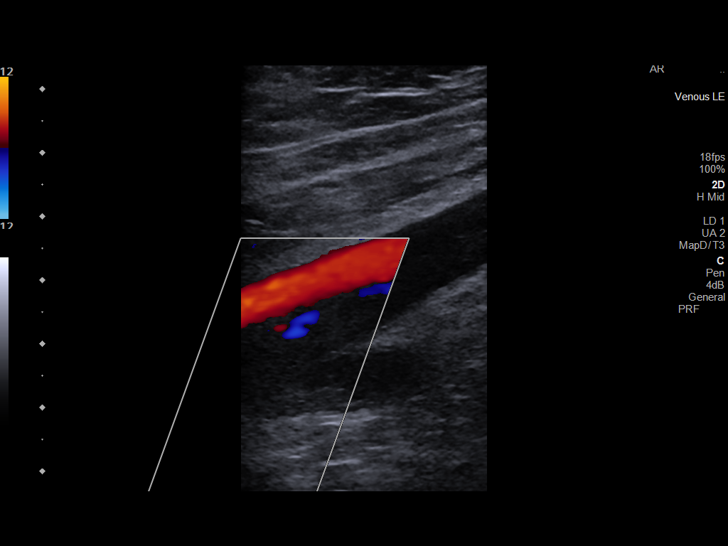
[im 40/70]
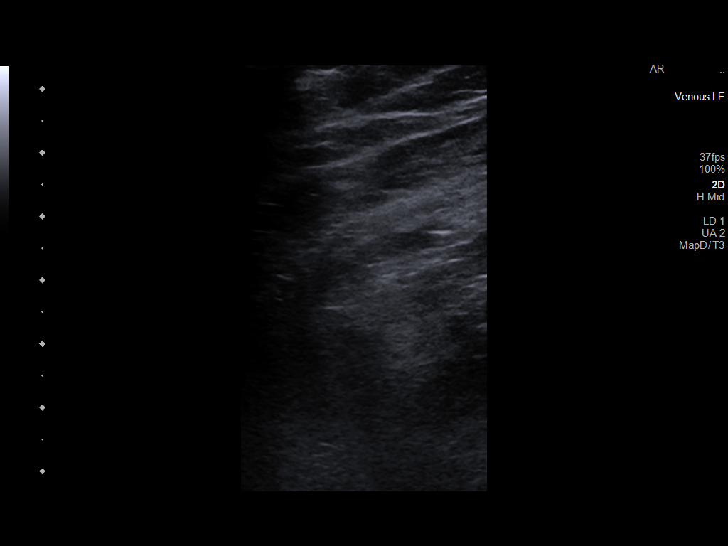
[im 46/70]
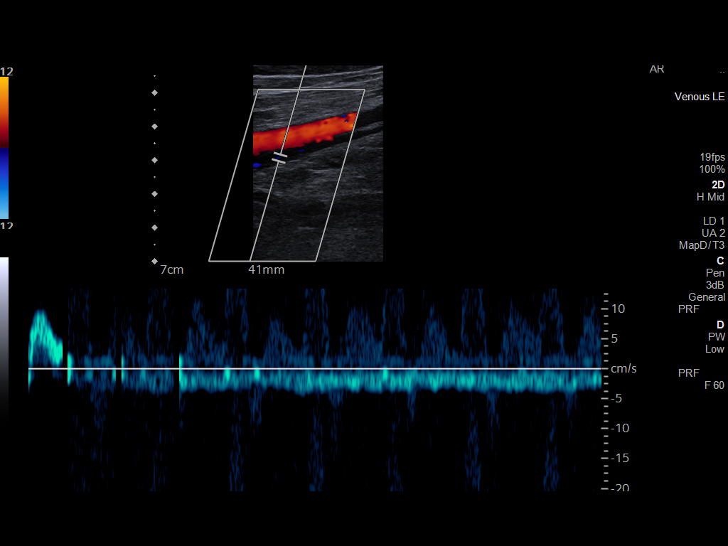
[im 52/70]
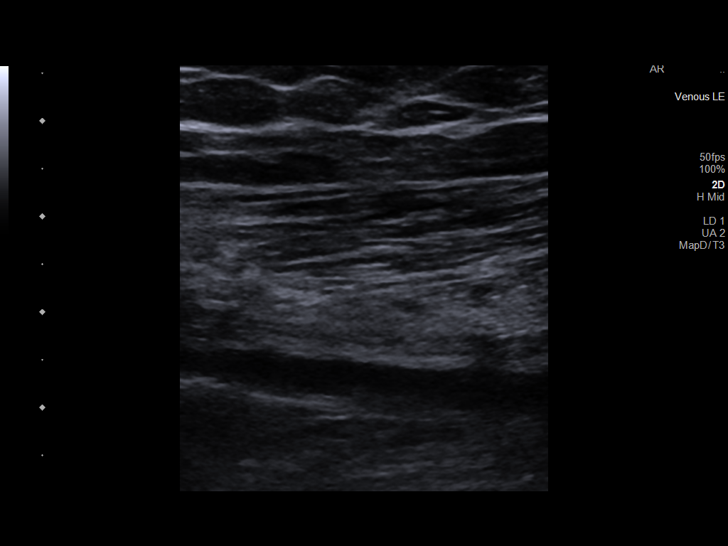
[im 58/70]
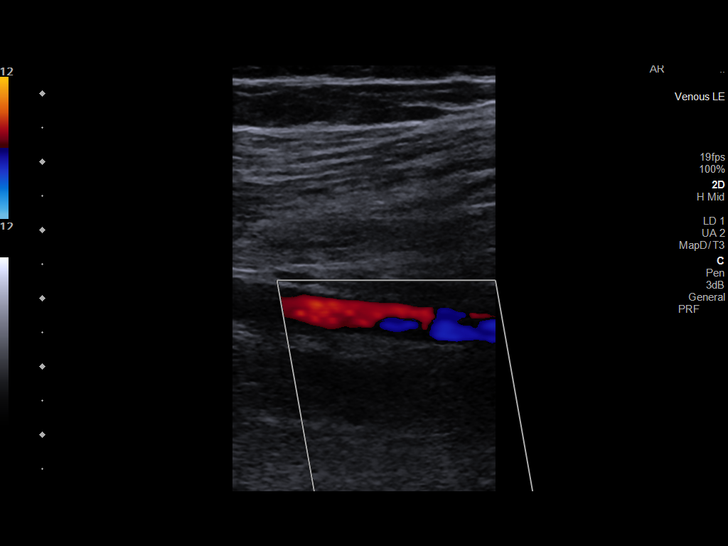
[im 64/70]
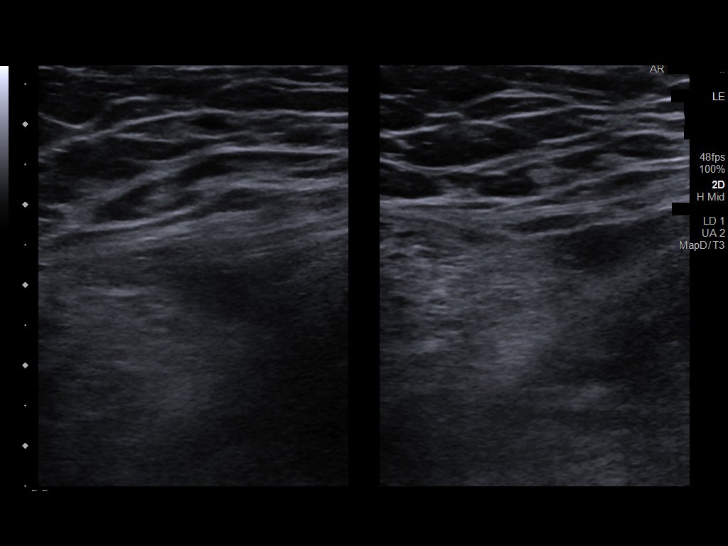
[im 70/70]
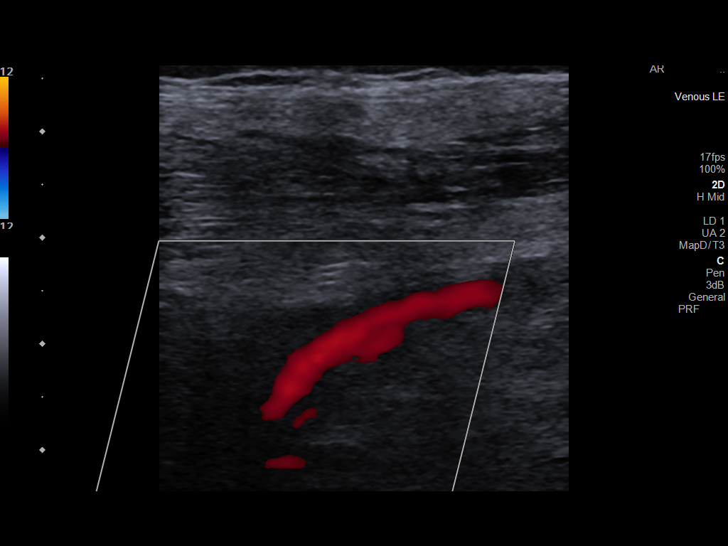

[13 of 24 positions shown; findings below may reference images not displayed]

FINDINGS: VENOUS

RIGHT lower extremity: Patent and compressible from the groin
through popliteal fossa. Spontaneous flow present with intact
augmentation and respiratory phasicity. No intraluminal thrombus
identified. Visualized calf veins and greater saphenous vein patent.

LEFT lower extremity: Thrombus identified throughout the LEFT lower
extremity from the common femoral vein through the popliteal vein
into calf. Clot varies from occlusive to nonocclusive. Clot also
involves the profundus femoral vein and the LEFT saphenofemoral
junction. Impaired spontaneous venous flow and compressibility.

OTHER

N/A

Limitations: None
IMPRESSION: Extensive deep venous thrombosis throughout the LEFT lower extremity
from the LEFT common femoral vein through calf veins including at
the LEFT saphenofemoral junction.

No evidence of deep venous thrombosis in the RIGHT lower extremity.

## 2021-12-29 ENCOUNTER — Telehealth: Payer: Self-pay | Admitting: Family Medicine
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
## 2021-12-30 NOTE — Telephone Encounter (Signed)
1.  We can try to help patient ?2.  If [facility] is asking for records during that time span and they have been seen by their specialist it will be necessary for them to get the records from the specialists sent to [facility].(In other words we cannot forward specialist records) ?As for the form-I am willing to take a look at it and talk with the patient might be able to help him out but there is a possibility that it may be requesting information that I just do not have for him not qualified to answer.  Nonetheless we will try to help-hard to know if we can help without seeing things so therefore I would recommend schedule office visit with me before the start of June-thanks-Dr. Lorin Picket ?

## 2021-12-30 NOTE — Telephone Encounter (Signed)
Patient has appointment 6/6/ to complete disability form. ?

## 2022-01-04 ENCOUNTER — Other Ambulatory Visit (HOSPITAL_COMMUNITY): Payer: Self-pay

## 2022-01-24 ENCOUNTER — Encounter: Payer: Self-pay | Admitting: Family Medicine

## 2022-01-24 ENCOUNTER — Ambulatory Visit (INDEPENDENT_AMBULATORY_CARE_PROVIDER_SITE_OTHER): Payer: BC Managed Care – PPO | Admitting: Family Medicine

## 2022-01-24 VITALS — BP 112/82 | HR 82 | Temp 98.2°F | Wt 233.0 lb

## 2022-01-24 DIAGNOSIS — S98922S Partial traumatic amputation of left foot, level unspecified, sequela: Secondary | ICD-10-CM | POA: Diagnosis not present

## 2022-01-24 DIAGNOSIS — Z86718 Personal history of other venous thrombosis and embolism: Secondary | ICD-10-CM | POA: Diagnosis not present

## 2022-01-24 DIAGNOSIS — S98922A Partial traumatic amputation of left foot, level unspecified, initial encounter: Secondary | ICD-10-CM | POA: Insufficient documentation

## 2022-01-24 DIAGNOSIS — M25572 Pain in left ankle and joints of left foot: Secondary | ICD-10-CM

## 2022-01-24 DIAGNOSIS — G8929 Other chronic pain: Secondary | ICD-10-CM

## 2022-01-24 DIAGNOSIS — R29898 Other symptoms and signs involving the musculoskeletal system: Secondary | ICD-10-CM

## 2022-01-24 NOTE — Progress Notes (Signed)
Subjective:    Patient ID: [name], male    DOB: [birth year], 31 y.o.   MRN: [id number]  HPI Pt here to have form completed from [facility] (disability). Pt states he is doing better. No questions or concerns at this time.   Current level of activity abilities Sitting-every 20 to 30 minutes he has to get up to help his leg from hurting so bad due to circulation and neuropathic pain Standing-he can stand for short periods of the time but every 20 to 30 minutes has to sit for period of time to get relief from his left leg relates a lot of fatigue tiredness in the leg as well as significant pain and discomfort Walking-due to weakness in his left leg he walks a slow pace plus also due to the frozen left ankle walks a slow pace maximum 10 minutes at a time then he has to either sit or lean up against something to get rest Driving-maximum 25 to 30 minutes without having to stop because of severe left leg pain and discomfort, has to get out and stretch walk around a little bit No climbing-ankle is frozen, poor strength in quadriceps and thigh, has balance issues and sensory issue in the left leg Is at risk for significant falls with ladders and high places Steps-can only do steps 1 at a time only if he has hand rails to hold onto Bending-patient can bend at the waist intermittently but not frequently because of severe back pain Squatting-unable to do any squatting because of left leg weakness as well as instability Carrying-10 to 20 pounds maximum short distances Lifting 40 pounds occasionally, 20 to 25 pounds, 5 to 10 pounds more frequently Skin-significant sensitivity to the touch of the lower leg and skin may have some element of reflex sympathetic dystrophy and neuropathic pain due to the trauma and surgery he has been through Chronic pain-has throbbing aching discomfort worse with activity along with some burning and has discomfort whether he is sitting standing walking or resting Left  elbow-difficulty with extension flexion rotation due to previous surgery Unable to do any significant hand activities on the left side because of left arm elbow pain and discomfort   Review of Systems     Objective:   Physical Exam Lungs are clear respiratory rate normal heart regular Blood pressure good on recheck Left arm has limited range of motion of the left elbow limited supination and pronation because of traumatic injury to the left elbow and previous surgery unable to flex much past 90 degrees unable to extend beyond 135 degrees 3+/5 strength in the quadriceps and thigh 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 Chronic pain of left ankle Patient currently taking gabapentin no longer on opioids Trying to do the best he can to tolerate this Please see restrictions in regards to activities as per above  2. History of blood clots On chronic Eliquis because of DVTs in his lower legs and a history of pulmonary embolism and DVTs no bleeding issues currently  3. Left leg weakness Significant left leg weakness despite physical therapy and home exercises makes it very difficult for him to do walking and squatting unable to do any type of crawling or kneeling please see discussions above  4. 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  Cutting down trees climbing trees weed eating lifting going up and down ladders now he is unable to do these things Patient is disabled from his activities  He is applying for Social Security disability

## 2022-02-23 DIAGNOSIS — S72352F Displaced comminuted fracture of shaft of left femur, subsequent encounter for open fracture type IIIA, IIIB, or IIIC with routine healing: Secondary | ICD-10-CM | POA: Diagnosis not present

## 2022-02-23 DIAGNOSIS — T84418D Breakdown (mechanical) of other internal orthopedic devices, implants and grafts, subsequent encounter: Secondary | ICD-10-CM | POA: Diagnosis not present

## 2022-02-23 DIAGNOSIS — S42452D Displaced fracture of lateral condyle of left humerus, subsequent encounter for fracture with routine healing: Secondary | ICD-10-CM | POA: Diagnosis not present

## 2022-02-23 DIAGNOSIS — S52002D Unspecified fracture of upper end of left ulna, subsequent encounter for closed fracture with routine healing: Secondary | ICD-10-CM | POA: Diagnosis not present

## 2022-02-23 DIAGNOSIS — X58XXXD Exposure to other specified factors, subsequent encounter: Secondary | ICD-10-CM | POA: Diagnosis not present

## 2022-02-23 DIAGNOSIS — S82402N Unspecified fracture of shaft of left fibula, subsequent encounter for open fracture type IIIA, IIIB, or IIIC with nonunion: Secondary | ICD-10-CM | POA: Diagnosis not present

## 2022-02-23 DIAGNOSIS — S82402K Unspecified fracture of shaft of left fibula, subsequent encounter for closed fracture with nonunion: Secondary | ICD-10-CM | POA: Diagnosis not present

## 2022-02-23 DIAGNOSIS — Y792 Prosthetic and other implants, materials and accessory orthopedic devices associated with adverse incidents: Secondary | ICD-10-CM | POA: Diagnosis not present

## 2022-02-23 DIAGNOSIS — S42402D Unspecified fracture of lower end of left humerus, subsequent encounter for fracture with routine healing: Secondary | ICD-10-CM | POA: Diagnosis not present

## 2022-02-23 DIAGNOSIS — M79632 Pain in left forearm: Secondary | ICD-10-CM | POA: Diagnosis not present

## 2022-02-23 DIAGNOSIS — S82202M Unspecified fracture of shaft of left tibia, subsequent encounter for open fracture type I or II with nonunion: Secondary | ICD-10-CM | POA: Diagnosis not present

## 2022-02-23 DIAGNOSIS — S72402D Unspecified fracture of lower end of left femur, subsequent encounter for closed fracture with routine healing: Secondary | ICD-10-CM | POA: Diagnosis not present

## 2022-02-23 DIAGNOSIS — S82202N Unspecified fracture of shaft of left tibia, subsequent encounter for open fracture type IIIA, IIIB, or IIIC with nonunion: Secondary | ICD-10-CM | POA: Diagnosis not present

## 2022-02-23 DIAGNOSIS — S42402S Unspecified fracture of lower end of left humerus, sequela: Secondary | ICD-10-CM | POA: Diagnosis not present

## 2022-02-23 DIAGNOSIS — S7292XD Unspecified fracture of left femur, subsequent encounter for closed fracture with routine healing: Secondary | ICD-10-CM | POA: Diagnosis not present

## 2022-02-23 DIAGNOSIS — S82302M Unspecified fracture of lower end of left tibia, subsequent encounter for open fracture type I or II with nonunion: Secondary | ICD-10-CM | POA: Diagnosis not present

## 2022-03-06 ENCOUNTER — Other Ambulatory Visit (HOSPITAL_COMMUNITY): Payer: Self-pay

## 2022-03-15 ENCOUNTER — Ambulatory Visit (HOSPITAL_COMMUNITY)
Admission: RE | Admit: 2022-03-15 | Discharge: 2022-03-15 | Disposition: A | Discharge status: Home / Self Care | Payer: BC Managed Care – PPO | Source: Ambulatory Visit | Attending: Physician Assistant | Admitting: Physician Assistant

## 2022-03-15 ENCOUNTER — Inpatient Hospital Stay (HOSPITAL_COMMUNITY): Payer: BC Managed Care – PPO | Attending: Hematology

## 2022-03-15 DIAGNOSIS — I824Y2 Acute embolism and thrombosis of unspecified deep veins of left proximal lower extremity: Secondary | ICD-10-CM | POA: Insufficient documentation

## 2022-03-15 DIAGNOSIS — Z0389 Encounter for observation for other suspected diseases and conditions ruled out: Secondary | ICD-10-CM | POA: Diagnosis not present

## 2022-03-16 DIAGNOSIS — S52022K Displaced fracture of olecranon process without intraarticular extension of left ulna, subsequent encounter for closed fracture with nonunion: Secondary | ICD-10-CM | POA: Diagnosis not present

## 2022-03-16 DIAGNOSIS — S72352F Displaced comminuted fracture of shaft of left femur, subsequent encounter for open fracture type IIIA, IIIB, or IIIC with routine healing: Secondary | ICD-10-CM | POA: Diagnosis not present

## 2022-03-16 DIAGNOSIS — S52002A Unspecified fracture of upper end of left ulna, initial encounter for closed fracture: Secondary | ICD-10-CM | POA: Diagnosis not present

## 2022-03-16 DIAGNOSIS — S52002D Unspecified fracture of upper end of left ulna, subsequent encounter for closed fracture with routine healing: Secondary | ICD-10-CM | POA: Diagnosis not present

## 2022-03-16 DIAGNOSIS — S52252A Displaced comminuted fracture of shaft of ulna, left arm, initial encounter for closed fracture: Secondary | ICD-10-CM | POA: Diagnosis not present

## 2022-03-16 DIAGNOSIS — X58XXXD Exposure to other specified factors, subsequent encounter: Secondary | ICD-10-CM | POA: Diagnosis not present

## 2022-03-16 DIAGNOSIS — M19032 Primary osteoarthritis, left wrist: Secondary | ICD-10-CM | POA: Diagnosis not present

## 2022-03-16 DIAGNOSIS — Z967 Presence of other bone and tendon implants: Secondary | ICD-10-CM | POA: Diagnosis not present

## 2022-03-16 DIAGNOSIS — S42402D Unspecified fracture of lower end of left humerus, subsequent encounter for fracture with routine healing: Secondary | ICD-10-CM | POA: Diagnosis not present

## 2022-03-23 DIAGNOSIS — S52022K Displaced fracture of olecranon process without intraarticular extension of left ulna, subsequent encounter for closed fracture with nonunion: Secondary | ICD-10-CM | POA: Diagnosis not present
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
## 2022-03-24 ENCOUNTER — Inpatient Hospital Stay: Payer: BC Managed Care – PPO | Admitting: Physician Assistant

## 2022-04-05 ENCOUNTER — Inpatient Hospital Stay: Payer: BC Managed Care – PPO | Attending: Hematology

## 2022-04-12 ENCOUNTER — Ambulatory Visit: Payer: BC Managed Care – PPO | Admitting: Physician Assistant

## 2022-04-21 ENCOUNTER — Telehealth: Payer: Self-pay | Admitting: *Deleted

## 2022-04-21 ENCOUNTER — Other Ambulatory Visit: Payer: Self-pay | Admitting: Family Medicine

## 2022-04-21 MED ORDER — PREDNISONE 10 MG PO TABS: ORAL_TABLET | ORAL | 0 refills | Status: DC

## 2022-04-21 NOTE — Telephone Encounter (Signed)
Left message to return call 

## 2022-04-21 NOTE — Telephone Encounter (Signed)
Patient notified

## 2022-04-21 NOTE — Telephone Encounter (Signed)
Cook, Jayce G, DO     Medication sent.   

## 2022-04-21 NOTE — Telephone Encounter (Signed)
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

## 2022-05-16 ENCOUNTER — Other Ambulatory Visit (HOSPITAL_COMMUNITY): Payer: Self-pay

## 2022-05-16 ENCOUNTER — Other Ambulatory Visit: Payer: Self-pay | Admitting: Family Medicine

## 2022-05-16 MED ORDER — APIXABAN 5 MG PO TABS
5.0000 mg | ORAL_TABLET | Freq: Two times a day (BID) | ORAL | 1 refills | Status: DC
  Filled 2022-05-16: qty 60, 30d supply, fill #0

## 2022-05-24 ENCOUNTER — Ambulatory Visit (INDEPENDENT_AMBULATORY_CARE_PROVIDER_SITE_OTHER): Payer: BC Managed Care – PPO | Admitting: Family Medicine

## 2022-05-24 ENCOUNTER — Encounter: Payer: Self-pay | Admitting: Family Medicine

## 2022-05-24 VITALS — BP 120/86 | HR 69 | Temp 98.1°F | Wt 238.0 lb

## 2022-05-24 DIAGNOSIS — M25522 Pain in left elbow: Secondary | ICD-10-CM

## 2022-05-24 DIAGNOSIS — E611 Iron deficiency: Secondary | ICD-10-CM | POA: Diagnosis not present

## 2022-05-24 DIAGNOSIS — R5383 Other fatigue: Secondary | ICD-10-CM | POA: Diagnosis not present

## 2022-05-24 DIAGNOSIS — F439 Reaction to severe stress, unspecified: Secondary | ICD-10-CM

## 2022-05-24 MED ORDER — APIXABAN 5 MG PO TABS: 5.0000 mg | ORAL_TABLET | Freq: Two times a day (BID) | ORAL | 1 refills | Status: DC

## 2022-05-24 NOTE — Progress Notes (Signed)
   Subjective:    Patient ID: [name], male    DOB: 08/29/1990, 29 y.o.   MRN: [id number]  HPI Pt arrives due to having some trouble sleeping. Pt states this past week has been better. Previously was able to fall asleep but unable to stay asleep. No OTC tried at this time.  Significant stress related issues.  Also having some leg pain. Alcohol abuse going into rehab in the middle of this month and will stay in rehab for 30 days at [facility] We did discuss anxiety depression and potential use of Cymbalta to help with his musculoskeletal pain as well patient defers currently  Review of Systems     Objective:   Physical Exam  General-in no acute distress Eyes-no discharge Lungs-respiratory rate normal, CTA CV-no murmurs,RRR Extremities skin warm dry no edema Neuro grossly normal Behavior normal, alert       Assessment & Plan:  Patient has significant pain where he has had his amputation Alcohol abuse go ahead with counseling for 30 days of fellowship all Anxiety and depression patient not suicidal we did discuss ways to treat this patient defers on medicine currently Patient is interested in doing D-dimer, ferritin, TIBC, CBC after he gets out of rehab  Patient interested in doing a follow-up ultrasound to make sure blood clots are gone He is interested in coming off Eliquis if possible Finally he has had ongoin elbow pain and the specialist at [facility] might be doing more surgery patient states he may want to have second opinion that we would utilize [facility] if that was the case

## 2022-06-04 DIAGNOSIS — F1020 Alcohol dependence, uncomplicated: Secondary | ICD-10-CM | POA: Diagnosis not present

## 2022-06-04 DIAGNOSIS — I8291 Chronic embolism and thrombosis of unspecified vein: Secondary | ICD-10-CM | POA: Diagnosis not present

## 2022-06-04 DIAGNOSIS — F329 Major depressive disorder, single episode, unspecified: Secondary | ICD-10-CM | POA: Diagnosis not present

## 2022-06-04 DIAGNOSIS — G629 Polyneuropathy, unspecified: Secondary | ICD-10-CM | POA: Diagnosis not present

## 2022-06-04 DIAGNOSIS — Z8616 Personal history of COVID-19: Secondary | ICD-10-CM | POA: Diagnosis not present

## 2022-06-04 DIAGNOSIS — F419 Anxiety disorder, unspecified: Secondary | ICD-10-CM | POA: Diagnosis not present

## 2022-06-04 DIAGNOSIS — Z8781 Personal history of (healed) traumatic fracture: Secondary | ICD-10-CM | POA: Diagnosis not present

## 2022-06-04 DIAGNOSIS — G4700 Insomnia, unspecified: Secondary | ICD-10-CM | POA: Diagnosis not present

## 2022-06-04 DIAGNOSIS — Z6832 Body mass index (BMI) 32.0-32.9, adult: Secondary | ICD-10-CM | POA: Diagnosis not present

## 2022-06-09 DIAGNOSIS — F329 Major depressive disorder, single episode, unspecified: Secondary | ICD-10-CM | POA: Diagnosis not present

## 2022-06-09 DIAGNOSIS — F419 Anxiety disorder, unspecified: Secondary | ICD-10-CM | POA: Diagnosis not present

## 2022-06-09 DIAGNOSIS — F1020 Alcohol dependence, uncomplicated: Secondary | ICD-10-CM | POA: Diagnosis not present

## 2022-06-09 DIAGNOSIS — Z8616 Personal history of COVID-19: Secondary | ICD-10-CM | POA: Diagnosis not present

## 2022-06-09 DIAGNOSIS — G4700 Insomnia, unspecified: Secondary | ICD-10-CM | POA: Diagnosis not present

## 2022-06-09 DIAGNOSIS — I8291 Chronic embolism and thrombosis of unspecified vein: Secondary | ICD-10-CM | POA: Diagnosis not present

## 2022-06-09 DIAGNOSIS — G629 Polyneuropathy, unspecified: Secondary | ICD-10-CM | POA: Diagnosis not present

## 2022-06-09 DIAGNOSIS — Z6832 Body mass index (BMI) 32.0-32.9, adult: Secondary | ICD-10-CM | POA: Diagnosis not present

## 2022-06-09 DIAGNOSIS — Z8781 Personal history of (healed) traumatic fracture: Secondary | ICD-10-CM | POA: Diagnosis not present

## 2022-06-19 DIAGNOSIS — G629 Polyneuropathy, unspecified: Secondary | ICD-10-CM | POA: Diagnosis not present

## 2022-06-19 DIAGNOSIS — F1020 Alcohol dependence, uncomplicated: Secondary | ICD-10-CM | POA: Diagnosis not present

## 2022-06-19 DIAGNOSIS — G4700 Insomnia, unspecified: Secondary | ICD-10-CM | POA: Diagnosis not present

## 2022-06-19 DIAGNOSIS — Z8616 Personal history of COVID-19: Secondary | ICD-10-CM | POA: Diagnosis not present

## 2022-06-19 DIAGNOSIS — Z8781 Personal history of (healed) traumatic fracture: Secondary | ICD-10-CM | POA: Diagnosis not present

## 2022-06-19 DIAGNOSIS — I8291 Chronic embolism and thrombosis of unspecified vein: Secondary | ICD-10-CM | POA: Diagnosis not present

## 2022-06-19 DIAGNOSIS — F419 Anxiety disorder, unspecified: Secondary | ICD-10-CM | POA: Diagnosis not present

## 2022-06-19 DIAGNOSIS — F329 Major depressive disorder, single episode, unspecified: Secondary | ICD-10-CM | POA: Diagnosis not present

## 2022-06-19 DIAGNOSIS — Z6832 Body mass index (BMI) 32.0-32.9, adult: Secondary | ICD-10-CM | POA: Diagnosis not present

## 2022-06-20 DIAGNOSIS — G629 Polyneuropathy, unspecified: Secondary | ICD-10-CM | POA: Diagnosis not present

## 2022-06-20 DIAGNOSIS — F1020 Alcohol dependence, uncomplicated: Secondary | ICD-10-CM | POA: Diagnosis not present

## 2022-06-20 DIAGNOSIS — F329 Major depressive disorder, single episode, unspecified: Secondary | ICD-10-CM | POA: Diagnosis not present

## 2022-06-20 DIAGNOSIS — G4700 Insomnia, unspecified: Secondary | ICD-10-CM | POA: Diagnosis not present

## 2022-06-20 DIAGNOSIS — F419 Anxiety disorder, unspecified: Secondary | ICD-10-CM | POA: Diagnosis not present

## 2022-06-20 DIAGNOSIS — Z8781 Personal history of (healed) traumatic fracture: Secondary | ICD-10-CM | POA: Diagnosis not present

## 2022-06-20 DIAGNOSIS — Z6832 Body mass index (BMI) 32.0-32.9, adult: Secondary | ICD-10-CM | POA: Diagnosis not present

## 2022-06-20 DIAGNOSIS — Z8616 Personal history of COVID-19: Secondary | ICD-10-CM | POA: Diagnosis not present

## 2022-06-20 DIAGNOSIS — I8291 Chronic embolism and thrombosis of unspecified vein: Secondary | ICD-10-CM | POA: Diagnosis not present

## 2022-06-21 DIAGNOSIS — G629 Polyneuropathy, unspecified: Secondary | ICD-10-CM | POA: Diagnosis not present

## 2022-06-21 DIAGNOSIS — F329 Major depressive disorder, single episode, unspecified: Secondary | ICD-10-CM | POA: Diagnosis not present

## 2022-06-21 DIAGNOSIS — Z8781 Personal history of (healed) traumatic fracture: Secondary | ICD-10-CM | POA: Diagnosis not present

## 2022-06-21 DIAGNOSIS — Z8616 Personal history of COVID-19: Secondary | ICD-10-CM | POA: Diagnosis not present

## 2022-06-21 DIAGNOSIS — F1020 Alcohol dependence, uncomplicated: Secondary | ICD-10-CM | POA: Diagnosis not present

## 2022-06-21 DIAGNOSIS — G4700 Insomnia, unspecified: Secondary | ICD-10-CM | POA: Diagnosis not present

## 2022-06-21 DIAGNOSIS — F419 Anxiety disorder, unspecified: Secondary | ICD-10-CM | POA: Diagnosis not present

## 2022-06-21 DIAGNOSIS — I8291 Chronic embolism and thrombosis of unspecified vein: Secondary | ICD-10-CM | POA: Diagnosis not present

## 2022-06-21 DIAGNOSIS — Z6832 Body mass index (BMI) 32.0-32.9, adult: Secondary | ICD-10-CM | POA: Diagnosis not present

## 2022-06-22 DIAGNOSIS — F329 Major depressive disorder, single episode, unspecified: Secondary | ICD-10-CM | POA: Diagnosis not present

## 2022-06-22 DIAGNOSIS — I8291 Chronic embolism and thrombosis of unspecified vein: Secondary | ICD-10-CM | POA: Diagnosis not present

## 2022-06-22 DIAGNOSIS — G629 Polyneuropathy, unspecified: Secondary | ICD-10-CM | POA: Diagnosis not present

## 2022-06-22 DIAGNOSIS — Z8616 Personal history of COVID-19: Secondary | ICD-10-CM | POA: Diagnosis not present

## 2022-06-22 DIAGNOSIS — F419 Anxiety disorder, unspecified: Secondary | ICD-10-CM | POA: Diagnosis not present

## 2022-06-22 DIAGNOSIS — G4700 Insomnia, unspecified: Secondary | ICD-10-CM | POA: Diagnosis not present

## 2022-06-22 DIAGNOSIS — Z8781 Personal history of (healed) traumatic fracture: Secondary | ICD-10-CM | POA: Diagnosis not present

## 2022-06-22 DIAGNOSIS — F1020 Alcohol dependence, uncomplicated: Secondary | ICD-10-CM | POA: Diagnosis not present

## 2022-06-22 DIAGNOSIS — Z6832 Body mass index (BMI) 32.0-32.9, adult: Secondary | ICD-10-CM | POA: Diagnosis not present

## 2022-06-23 DIAGNOSIS — F419 Anxiety disorder, unspecified: Secondary | ICD-10-CM | POA: Diagnosis not present

## 2022-06-23 DIAGNOSIS — F329 Major depressive disorder, single episode, unspecified: Secondary | ICD-10-CM | POA: Diagnosis not present

## 2022-06-23 DIAGNOSIS — Z6832 Body mass index (BMI) 32.0-32.9, adult: Secondary | ICD-10-CM | POA: Diagnosis not present

## 2022-06-23 DIAGNOSIS — G629 Polyneuropathy, unspecified: Secondary | ICD-10-CM | POA: Diagnosis not present

## 2022-06-23 DIAGNOSIS — Z8781 Personal history of (healed) traumatic fracture: Secondary | ICD-10-CM | POA: Diagnosis not present

## 2022-06-23 DIAGNOSIS — G4700 Insomnia, unspecified: Secondary | ICD-10-CM | POA: Diagnosis not present

## 2022-06-23 DIAGNOSIS — F1020 Alcohol dependence, uncomplicated: Secondary | ICD-10-CM | POA: Diagnosis not present

## 2022-06-23 DIAGNOSIS — I8291 Chronic embolism and thrombosis of unspecified vein: Secondary | ICD-10-CM | POA: Diagnosis not present

## 2022-06-23 DIAGNOSIS — Z8616 Personal history of COVID-19: Secondary | ICD-10-CM | POA: Diagnosis not present

## 2022-06-24 DIAGNOSIS — F1020 Alcohol dependence, uncomplicated: Secondary | ICD-10-CM | POA: Diagnosis not present

## 2022-06-24 DIAGNOSIS — F329 Major depressive disorder, single episode, unspecified: Secondary | ICD-10-CM | POA: Diagnosis not present

## 2022-06-24 DIAGNOSIS — G629 Polyneuropathy, unspecified: Secondary | ICD-10-CM | POA: Diagnosis not present

## 2022-06-24 DIAGNOSIS — F419 Anxiety disorder, unspecified: Secondary | ICD-10-CM | POA: Diagnosis not present

## 2022-06-24 DIAGNOSIS — G4700 Insomnia, unspecified: Secondary | ICD-10-CM | POA: Diagnosis not present

## 2022-06-24 DIAGNOSIS — Z8781 Personal history of (healed) traumatic fracture: Secondary | ICD-10-CM | POA: Diagnosis not present

## 2022-06-24 DIAGNOSIS — Z8616 Personal history of COVID-19: Secondary | ICD-10-CM | POA: Diagnosis not present

## 2022-06-24 DIAGNOSIS — I8291 Chronic embolism and thrombosis of unspecified vein: Secondary | ICD-10-CM | POA: Diagnosis not present

## 2022-06-24 DIAGNOSIS — Z6832 Body mass index (BMI) 32.0-32.9, adult: Secondary | ICD-10-CM | POA: Diagnosis not present

## 2022-06-25 DIAGNOSIS — Z8616 Personal history of COVID-19: Secondary | ICD-10-CM | POA: Diagnosis not present

## 2022-06-25 DIAGNOSIS — I8291 Chronic embolism and thrombosis of unspecified vein: Secondary | ICD-10-CM | POA: Diagnosis not present

## 2022-06-25 DIAGNOSIS — Z8781 Personal history of (healed) traumatic fracture: Secondary | ICD-10-CM | POA: Diagnosis not present

## 2022-06-25 DIAGNOSIS — F419 Anxiety disorder, unspecified: Secondary | ICD-10-CM | POA: Diagnosis not present

## 2022-06-25 DIAGNOSIS — G4700 Insomnia, unspecified: Secondary | ICD-10-CM | POA: Diagnosis not present

## 2022-06-25 DIAGNOSIS — F1020 Alcohol dependence, uncomplicated: Secondary | ICD-10-CM | POA: Diagnosis not present

## 2022-06-25 DIAGNOSIS — Z6832 Body mass index (BMI) 32.0-32.9, adult: Secondary | ICD-10-CM | POA: Diagnosis not present

## 2022-06-25 DIAGNOSIS — F329 Major depressive disorder, single episode, unspecified: Secondary | ICD-10-CM | POA: Diagnosis not present

## 2022-06-25 DIAGNOSIS — G629 Polyneuropathy, unspecified: Secondary | ICD-10-CM | POA: Diagnosis not present

## 2022-06-26 DIAGNOSIS — Z6832 Body mass index (BMI) 32.0-32.9, adult: Secondary | ICD-10-CM | POA: Diagnosis not present

## 2022-06-26 DIAGNOSIS — F419 Anxiety disorder, unspecified: Secondary | ICD-10-CM | POA: Diagnosis not present

## 2022-06-26 DIAGNOSIS — F1020 Alcohol dependence, uncomplicated: Secondary | ICD-10-CM | POA: Diagnosis not present

## 2022-06-26 DIAGNOSIS — Z8781 Personal history of (healed) traumatic fracture: Secondary | ICD-10-CM | POA: Diagnosis not present

## 2022-06-26 DIAGNOSIS — G4700 Insomnia, unspecified: Secondary | ICD-10-CM | POA: Diagnosis not present

## 2022-06-26 DIAGNOSIS — I8291 Chronic embolism and thrombosis of unspecified vein: Secondary | ICD-10-CM | POA: Diagnosis not present

## 2022-06-26 DIAGNOSIS — G629 Polyneuropathy, unspecified: Secondary | ICD-10-CM | POA: Diagnosis not present

## 2022-06-26 DIAGNOSIS — Z8616 Personal history of COVID-19: Secondary | ICD-10-CM | POA: Diagnosis not present

## 2022-06-26 DIAGNOSIS — F329 Major depressive disorder, single episode, unspecified: Secondary | ICD-10-CM | POA: Diagnosis not present

## 2022-06-27 DIAGNOSIS — G4700 Insomnia, unspecified: Secondary | ICD-10-CM | POA: Diagnosis not present

## 2022-06-27 DIAGNOSIS — Z8616 Personal history of COVID-19: Secondary | ICD-10-CM | POA: Diagnosis not present

## 2022-06-27 DIAGNOSIS — F1020 Alcohol dependence, uncomplicated: Secondary | ICD-10-CM | POA: Diagnosis not present

## 2022-06-27 DIAGNOSIS — G629 Polyneuropathy, unspecified: Secondary | ICD-10-CM | POA: Diagnosis not present

## 2022-06-27 DIAGNOSIS — Z8781 Personal history of (healed) traumatic fracture: Secondary | ICD-10-CM | POA: Diagnosis not present

## 2022-06-27 DIAGNOSIS — F329 Major depressive disorder, single episode, unspecified: Secondary | ICD-10-CM | POA: Diagnosis not present

## 2022-06-27 DIAGNOSIS — I8291 Chronic embolism and thrombosis of unspecified vein: Secondary | ICD-10-CM | POA: Diagnosis not present

## 2022-06-27 DIAGNOSIS — Z6832 Body mass index (BMI) 32.0-32.9, adult: Secondary | ICD-10-CM | POA: Diagnosis not present

## 2022-06-27 DIAGNOSIS — F419 Anxiety disorder, unspecified: Secondary | ICD-10-CM | POA: Diagnosis not present

## 2022-06-28 DIAGNOSIS — I8291 Chronic embolism and thrombosis of unspecified vein: Secondary | ICD-10-CM | POA: Diagnosis not present

## 2022-06-28 DIAGNOSIS — Z8781 Personal history of (healed) traumatic fracture: Secondary | ICD-10-CM | POA: Diagnosis not present

## 2022-06-28 DIAGNOSIS — F1020 Alcohol dependence, uncomplicated: Secondary | ICD-10-CM | POA: Diagnosis not present

## 2022-06-28 DIAGNOSIS — G629 Polyneuropathy, unspecified: Secondary | ICD-10-CM | POA: Diagnosis not present

## 2022-06-28 DIAGNOSIS — G4700 Insomnia, unspecified: Secondary | ICD-10-CM | POA: Diagnosis not present

## 2022-06-28 DIAGNOSIS — Z6832 Body mass index (BMI) 32.0-32.9, adult: Secondary | ICD-10-CM | POA: Diagnosis not present

## 2022-06-28 DIAGNOSIS — Z8616 Personal history of COVID-19: Secondary | ICD-10-CM | POA: Diagnosis not present

## 2022-06-28 DIAGNOSIS — F419 Anxiety disorder, unspecified: Secondary | ICD-10-CM | POA: Diagnosis not present

## 2022-06-28 DIAGNOSIS — F329 Major depressive disorder, single episode, unspecified: Secondary | ICD-10-CM | POA: Diagnosis not present

## 2022-06-29 DIAGNOSIS — F329 Major depressive disorder, single episode, unspecified: Secondary | ICD-10-CM | POA: Diagnosis not present

## 2022-06-29 DIAGNOSIS — F1020 Alcohol dependence, uncomplicated: Secondary | ICD-10-CM | POA: Diagnosis not present

## 2022-06-29 DIAGNOSIS — G4700 Insomnia, unspecified: Secondary | ICD-10-CM | POA: Diagnosis not present

## 2022-06-29 DIAGNOSIS — I8291 Chronic embolism and thrombosis of unspecified vein: Secondary | ICD-10-CM | POA: Diagnosis not present

## 2022-06-29 DIAGNOSIS — G629 Polyneuropathy, unspecified: Secondary | ICD-10-CM | POA: Diagnosis not present

## 2022-06-29 DIAGNOSIS — Z8781 Personal history of (healed) traumatic fracture: Secondary | ICD-10-CM | POA: Diagnosis not present

## 2022-06-29 DIAGNOSIS — F419 Anxiety disorder, unspecified: Secondary | ICD-10-CM | POA: Diagnosis not present

## 2022-06-29 DIAGNOSIS — Z8616 Personal history of COVID-19: Secondary | ICD-10-CM | POA: Diagnosis not present

## 2022-06-29 DIAGNOSIS — Z6832 Body mass index (BMI) 32.0-32.9, adult: Secondary | ICD-10-CM | POA: Diagnosis not present

## 2022-06-30 DIAGNOSIS — Z6832 Body mass index (BMI) 32.0-32.9, adult: Secondary | ICD-10-CM | POA: Diagnosis not present

## 2022-06-30 DIAGNOSIS — G629 Polyneuropathy, unspecified: Secondary | ICD-10-CM | POA: Diagnosis not present

## 2022-06-30 DIAGNOSIS — F329 Major depressive disorder, single episode, unspecified: Secondary | ICD-10-CM | POA: Diagnosis not present

## 2022-06-30 DIAGNOSIS — F1020 Alcohol dependence, uncomplicated: Secondary | ICD-10-CM | POA: Diagnosis not present

## 2022-06-30 DIAGNOSIS — I8291 Chronic embolism and thrombosis of unspecified vein: Secondary | ICD-10-CM | POA: Diagnosis not present

## 2022-06-30 DIAGNOSIS — F419 Anxiety disorder, unspecified: Secondary | ICD-10-CM | POA: Diagnosis not present

## 2022-06-30 DIAGNOSIS — Z8781 Personal history of (healed) traumatic fracture: Secondary | ICD-10-CM | POA: Diagnosis not present

## 2022-06-30 DIAGNOSIS — G4700 Insomnia, unspecified: Secondary | ICD-10-CM | POA: Diagnosis not present

## 2022-06-30 DIAGNOSIS — Z8616 Personal history of COVID-19: Secondary | ICD-10-CM | POA: Diagnosis not present

## 2022-07-01 DIAGNOSIS — Z8616 Personal history of COVID-19: Secondary | ICD-10-CM | POA: Diagnosis not present

## 2022-07-01 DIAGNOSIS — G4700 Insomnia, unspecified: Secondary | ICD-10-CM | POA: Diagnosis not present

## 2022-07-01 DIAGNOSIS — G629 Polyneuropathy, unspecified: Secondary | ICD-10-CM | POA: Diagnosis not present

## 2022-07-01 DIAGNOSIS — Z6832 Body mass index (BMI) 32.0-32.9, adult: Secondary | ICD-10-CM | POA: Diagnosis not present

## 2022-07-01 DIAGNOSIS — Z8781 Personal history of (healed) traumatic fracture: Secondary | ICD-10-CM | POA: Diagnosis not present

## 2022-07-01 DIAGNOSIS — F419 Anxiety disorder, unspecified: Secondary | ICD-10-CM | POA: Diagnosis not present

## 2022-07-01 DIAGNOSIS — F329 Major depressive disorder, single episode, unspecified: Secondary | ICD-10-CM | POA: Diagnosis not present

## 2022-07-01 DIAGNOSIS — I8291 Chronic embolism and thrombosis of unspecified vein: Secondary | ICD-10-CM | POA: Diagnosis not present

## 2022-07-01 DIAGNOSIS — F1020 Alcohol dependence, uncomplicated: Secondary | ICD-10-CM | POA: Diagnosis not present

## 2022-07-02 DIAGNOSIS — Z8781 Personal history of (healed) traumatic fracture: Secondary | ICD-10-CM | POA: Diagnosis not present

## 2022-07-02 DIAGNOSIS — I8291 Chronic embolism and thrombosis of unspecified vein: Secondary | ICD-10-CM | POA: Diagnosis not present

## 2022-07-02 DIAGNOSIS — F329 Major depressive disorder, single episode, unspecified: Secondary | ICD-10-CM | POA: Diagnosis not present

## 2022-07-02 DIAGNOSIS — F419 Anxiety disorder, unspecified: Secondary | ICD-10-CM | POA: Diagnosis not present

## 2022-07-02 DIAGNOSIS — Z8616 Personal history of COVID-19: Secondary | ICD-10-CM | POA: Diagnosis not present

## 2022-07-02 DIAGNOSIS — G629 Polyneuropathy, unspecified: Secondary | ICD-10-CM | POA: Diagnosis not present

## 2022-07-02 DIAGNOSIS — Z6832 Body mass index (BMI) 32.0-32.9, adult: Secondary | ICD-10-CM | POA: Diagnosis not present

## 2022-07-02 DIAGNOSIS — G4700 Insomnia, unspecified: Secondary | ICD-10-CM | POA: Diagnosis not present

## 2022-07-02 DIAGNOSIS — F1020 Alcohol dependence, uncomplicated: Secondary | ICD-10-CM | POA: Diagnosis not present

## 2022-07-03 DIAGNOSIS — G629 Polyneuropathy, unspecified: Secondary | ICD-10-CM | POA: Diagnosis not present

## 2022-07-03 DIAGNOSIS — F419 Anxiety disorder, unspecified: Secondary | ICD-10-CM | POA: Diagnosis not present

## 2022-07-03 DIAGNOSIS — G4700 Insomnia, unspecified: Secondary | ICD-10-CM | POA: Diagnosis not present

## 2022-07-03 DIAGNOSIS — Z8616 Personal history of COVID-19: Secondary | ICD-10-CM | POA: Diagnosis not present

## 2022-07-03 DIAGNOSIS — F1020 Alcohol dependence, uncomplicated: Secondary | ICD-10-CM | POA: Diagnosis not present

## 2022-07-03 DIAGNOSIS — Z8781 Personal history of (healed) traumatic fracture: Secondary | ICD-10-CM | POA: Diagnosis not present

## 2022-07-03 DIAGNOSIS — F329 Major depressive disorder, single episode, unspecified: Secondary | ICD-10-CM | POA: Diagnosis not present

## 2022-07-03 DIAGNOSIS — I8291 Chronic embolism and thrombosis of unspecified vein: Secondary | ICD-10-CM | POA: Diagnosis not present

## 2022-07-03 DIAGNOSIS — Z6832 Body mass index (BMI) 32.0-32.9, adult: Secondary | ICD-10-CM | POA: Diagnosis not present

## 2022-07-09 ENCOUNTER — Ambulatory Visit
Admission: EM | Admit: 2022-07-09 | Discharge: 2022-07-09 | Disposition: A | Discharge status: Home / Self Care | Payer: BC Managed Care – PPO

## 2022-07-09 DIAGNOSIS — J069 Acute upper respiratory infection, unspecified: Secondary | ICD-10-CM | POA: Diagnosis not present

## 2022-07-09 DIAGNOSIS — Z20822 Contact with and (suspected) exposure to covid-19: Secondary | ICD-10-CM | POA: Diagnosis not present
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
## 2022-07-09 NOTE — ED Triage Notes (Signed)
Pt reports he has been around people with  covid. He has a sore throat, body aches, had a fever congestion, and headache  for a week.   Took tylenol for fever and it helped

## 2022-07-19 ENCOUNTER — Ambulatory Visit: Payer: BC Managed Care – PPO | Admitting: Family Medicine

## 2022-07-25 ENCOUNTER — Other Ambulatory Visit: Payer: Self-pay | Admitting: Family Medicine

## 2022-07-25 ENCOUNTER — Other Ambulatory Visit: Payer: Self-pay | Admitting: *Deleted

## 2022-07-25 ENCOUNTER — Other Ambulatory Visit (HOSPITAL_COMMUNITY): Payer: Self-pay

## 2022-07-25 MED ORDER — APIXABAN 5 MG PO TABS
5.0000 mg | ORAL_TABLET | Freq: Two times a day (BID) | ORAL | 0 refills | Status: DC
  Filled 2022-07-25: qty 60, 30d supply, fill #0

## 2022-07-26 ENCOUNTER — Other Ambulatory Visit (HOSPITAL_COMMUNITY): Payer: Self-pay

## 2022-08-07 ENCOUNTER — Ambulatory Visit: Payer: BC Managed Care – PPO | Admitting: Family Medicine

## 2022-08-09 ENCOUNTER — Ambulatory Visit
Admission: EM | Admit: 2022-08-09 | Discharge: 2022-08-09 | Disposition: A | Discharge status: Home / Self Care | Payer: BC Managed Care – PPO | Attending: Family Medicine | Admitting: Family Medicine

## 2022-08-09 DIAGNOSIS — J111 Influenza due to unidentified influenza virus with other respiratory manifestations: Secondary | ICD-10-CM | POA: Diagnosis not present

## 2022-08-09 MED ORDER — PROMETHAZINE-DM 6.25-15 MG/5ML PO SYRP: 5.0000 mL | ORAL_SOLUTION | Freq: Four times a day (QID) | ORAL | 0 refills | Status: DC | PRN

## 2022-08-09 MED ORDER — OSELTAMIVIR PHOSPHATE 75 MG PO CAPS: 75.0000 mg | ORAL_CAPSULE | Freq: Two times a day (BID) | ORAL | 0 refills | Status: DC
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
## 2022-08-09 NOTE — ED Triage Notes (Signed)
Body aches, chills, headache, congestion, SOB, that started 2 days ago. Taking Claritin and tylenol to help. Wife has the flu B right now.

## 2022-08-23 ENCOUNTER — Ambulatory Visit (INDEPENDENT_AMBULATORY_CARE_PROVIDER_SITE_OTHER): Payer: BC Managed Care – PPO | Admitting: Family Medicine

## 2022-08-23 ENCOUNTER — Other Ambulatory Visit (HOSPITAL_COMMUNITY): Payer: Self-pay

## 2022-08-23 VITALS — BP 130/84 | Temp 98.1°F | Ht 72.84 in | Wt 244.0 lb

## 2022-08-23 DIAGNOSIS — Z86718 Personal history of other venous thrombosis and embolism: Secondary | ICD-10-CM

## 2022-08-23 DIAGNOSIS — F1021 Alcohol dependence, in remission: Secondary | ICD-10-CM

## 2022-08-23 DIAGNOSIS — L739 Follicular disorder, unspecified: Secondary | ICD-10-CM

## 2022-08-23 DIAGNOSIS — E611 Iron deficiency: Secondary | ICD-10-CM | POA: Diagnosis not present

## 2022-08-23 DIAGNOSIS — S98922S Partial traumatic amputation of left foot, level unspecified, sequela: Secondary | ICD-10-CM

## 2022-08-23 MED ORDER — APIXABAN 5 MG PO TABS
5.0000 mg | ORAL_TABLET | Freq: Two times a day (BID) | ORAL | 5 refills | Status: DC
  Filled 2022-08-23 – 2022-08-26 (×2): qty 60, 30d supply, fill #0
  Filled 2022-09-25 (×2): qty 60, 30d supply, fill #1
  Filled 2022-10-24: qty 60, 30d supply, fill #2
  Filled 2022-10-25: qty 60, 30d supply, fill #0
  Filled 2022-11-22: qty 60, 30d supply, fill #1
  Filled 2022-12-22: qty 60, 30d supply, fill #2
  Filled 2023-01-23: qty 60, 30d supply, fill #3

## 2022-08-23 MED ORDER — DOXYCYCLINE HYCLATE 100 MG PO TABS: 100.0000 mg | ORAL_TABLET | Freq: Two times a day (BID) | ORAL | 0 refills | Status: DC

## 2022-08-23 NOTE — Progress Notes (Signed)
   Subjective:    Patient ID: [name], male    DOB: 12-Jun-1991, 32 y.o.   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 Iron deficiency Referral to hematology - Ambulatory referral to Hematology / Oncology  2. 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

## 2022-08-24 DIAGNOSIS — T84193A Other mechanical complication of internal fixation device of bone of left forearm, initial encounter: Secondary | ICD-10-CM | POA: Diagnosis not present

## 2022-08-24 DIAGNOSIS — S42402G Unspecified fracture of lower end of left humerus, subsequent encounter for fracture with delayed healing: Secondary | ICD-10-CM | POA: Diagnosis not present

## 2022-08-24 DIAGNOSIS — S52002D Unspecified fracture of upper end of left ulna, subsequent encounter for closed fracture with routine healing: Secondary | ICD-10-CM | POA: Diagnosis not present

## 2022-08-24 DIAGNOSIS — X58XXXS Exposure to other specified factors, sequela: Secondary | ICD-10-CM | POA: Diagnosis not present

## 2022-08-24 DIAGNOSIS — M25522 Pain in left elbow: Secondary | ICD-10-CM | POA: Diagnosis not present

## 2022-08-24 DIAGNOSIS — S42402S Unspecified fracture of lower end of left humerus, sequela: Secondary | ICD-10-CM | POA: Diagnosis not present

## 2022-08-24 DIAGNOSIS — S42432D Displaced fracture (avulsion) of lateral epicondyle of left humerus, subsequent encounter for fracture with routine healing: Secondary | ICD-10-CM | POA: Diagnosis not present

## 2022-08-26 ENCOUNTER — Other Ambulatory Visit (HOSPITAL_COMMUNITY): Payer: Self-pay

## 2022-08-28 ENCOUNTER — Other Ambulatory Visit (HOSPITAL_COMMUNITY): Payer: Self-pay

## 2022-09-14 ENCOUNTER — Inpatient Hospital Stay: Payer: BC Managed Care – PPO

## 2022-09-15 ENCOUNTER — Inpatient Hospital Stay: Payer: BC Managed Care – PPO | Attending: Hematology

## 2022-09-15 DIAGNOSIS — I2699 Other pulmonary embolism without acute cor pulmonale: Secondary | ICD-10-CM

## 2022-09-15 DIAGNOSIS — D509 Iron deficiency anemia, unspecified: Secondary | ICD-10-CM

## 2022-09-15 DIAGNOSIS — I824Y2 Acute embolism and thrombosis of unspecified deep veins of left proximal lower extremity: Secondary | ICD-10-CM | POA: Diagnosis not present

## 2022-09-15 LAB — IRON AND TIBC
Iron: 60 ug/dL (ref 45–182)
Saturation Ratios: 15 % — ABNORMAL LOW (ref 17.9–39.5)
TIBC: 398 ug/dL (ref 250–450)
UIBC: 338 ug/dL

## 2022-09-15 LAB — CBC WITH DIFFERENTIAL/PLATELET
Abs Immature Granulocytes: 0.01 10*3/uL (ref 0.00–0.07)
Basophils Absolute: 0.1 10*3/uL (ref 0.0–0.1)
Basophils Relative: 1 %
Eosinophils Absolute: 0.2 10*3/uL (ref 0.0–0.5)
Eosinophils Relative: 3 %
HCT: 47.9 % (ref 39.0–52.0)
Hemoglobin: 16.2 g/dL (ref 13.0–17.0)
Immature Granulocytes: 0 %
Lymphocytes Relative: 35 %
Lymphs Abs: 2.2 10*3/uL (ref 0.7–4.0)
MCH: 28.7 pg (ref 26.0–34.0)
MCHC: 33.8 g/dL (ref 30.0–36.0)
MCV: 84.8 fL (ref 80.0–100.0)
Monocytes Absolute: 0.5 10*3/uL (ref 0.1–1.0)
Monocytes Relative: 9 %
Neutro Abs: 3.2 10*3/uL (ref 1.7–7.7)
Neutrophils Relative %: 52 %
Platelets: 255 10*3/uL (ref 150–400)
RBC: 5.65 MIL/uL (ref 4.22–5.81)
RDW: 13.5 % (ref 11.5–15.5)
WBC: 6.2 10*3/uL (ref 4.0–10.5)
nRBC: 0 % (ref 0.0–0.2)

## 2022-09-15 LAB — FERRITIN: Ferritin: 27 ng/mL (ref 24–336)

## 2022-09-15 LAB — D-DIMER, QUANTITATIVE: D-Dimer, Quant: 0.37 ug/mL-FEU (ref 0.00–0.50)

## 2022-09-21 ENCOUNTER — Inpatient Hospital Stay: Payer: BC Managed Care – PPO | Attending: Physician Assistant | Admitting: Physician Assistant

## 2022-09-21 VITALS — BP 127/80 | HR 77 | Temp 98.3°F | Resp 18 | Ht 72.0 in | Wt 241.3 lb

## 2022-09-21 DIAGNOSIS — I824Y2 Acute embolism and thrombosis of unspecified deep veins of left proximal lower extremity: Secondary | ICD-10-CM | POA: Diagnosis not present

## 2022-09-21 DIAGNOSIS — Z79899 Other long term (current) drug therapy: Secondary | ICD-10-CM | POA: Diagnosis not present

## 2022-09-21 DIAGNOSIS — G8929 Other chronic pain: Secondary | ICD-10-CM | POA: Insufficient documentation

## 2022-09-21 DIAGNOSIS — Z86718 Personal history of other venous thrombosis and embolism: Secondary | ICD-10-CM | POA: Insufficient documentation

## 2022-09-21 DIAGNOSIS — Z7982 Long term (current) use of aspirin: Secondary | ICD-10-CM | POA: Insufficient documentation

## 2022-09-21 DIAGNOSIS — R519 Headache, unspecified: Secondary | ICD-10-CM | POA: Diagnosis not present

## 2022-09-21 DIAGNOSIS — Z881 Allergy status to other antibiotic agents status: Secondary | ICD-10-CM | POA: Diagnosis not present

## 2022-09-21 DIAGNOSIS — E611 Iron deficiency: Secondary | ICD-10-CM | POA: Insufficient documentation

## 2022-09-21 DIAGNOSIS — I2699 Other pulmonary embolism without acute cor pulmonale: Secondary | ICD-10-CM | POA: Insufficient documentation

## 2022-09-21 DIAGNOSIS — D6862 Lupus anticoagulant syndrome: Secondary | ICD-10-CM | POA: Diagnosis not present

## 2022-09-21 DIAGNOSIS — Z87891 Personal history of nicotine dependence: Secondary | ICD-10-CM | POA: Diagnosis not present

## 2022-09-21 NOTE — Patient Instructions (Signed)
[place] [facility] **   You were seen today by Tarri Abernethy PA-C for your history of blood clots.   The ultrasound of your left leg that was performed in July 2023 showed that your blood clots had resolved.  However, since you still have intermittent leg pain and swelling, we will check another ultrasound of your leg before taking you off of Eliquis. We will call you with the results of your ultrasound. If you are able to stop Eliquis, we will need to recheck some other labs to make sure you do not have any blood clotting disorder. We would also consider starting you on low-dose aspirin if we stop the Eliquis in the future Please see the attached handout for important information about DVT and PE (blood clots in your legs and lungs).  Be familiar with symptoms that warrant emergency medical attention.  ** Thank you for trusting me with your healthcare!  I strive to provide all of my patients with quality care at each visit.  If you receive a survey for this visit, I would be so grateful to you for taking the time to provide feedback.  Thank you in advance!  ~ Ibn Stief                   Dr. Derek Jack   &   Tarri Abernethy, PA-C   - - - - - - - - - - - - - - - - - -    Thank you for choosing [facility] at [facility] to provide your oncology and hematology care.  To afford each patient quality time with our provider, please arrive at least 15 minutes before your scheduled appointment time.   If you have a lab appointment with the [facility] please come in thru the Main Entrance and check in at the main information desk.  You need to re-schedule your appointment should you arrive 10 or more minutes late.  We strive to give you quality time with our providers, and arriving late affects you and other patients whose appointments are after yours.  Also, if you no show three or more times  for appointments you may be dismissed from the clinic at the providers discretion.     Again, thank you for choosing [facility].  Our hope is that these requests will decrease the amount of time that you wait before being seen by our physicians.       _____________________________________________________________  Should you have questions after your visit to [facility], please contact our office at [phone] and follow the prompts.  Our office hours are 8:00 a.m. and 4:30 p.m. Monday - Friday.  Please note that voicemails left after 4:00 p.m. may not be returned until the following business day.  We are closed weekends and major holidays.  You do have access to a nurse 24-7, just call the main number to the clinic [phone] and do not press any options, hold on the line and a nurse will answer the phone.    For prescription refill requests, have your pharmacy contact our office and allow 72 hours.
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
## 2022-09-25 ENCOUNTER — Other Ambulatory Visit (HOSPITAL_COMMUNITY): Payer: Self-pay

## 2022-09-25 ENCOUNTER — Other Ambulatory Visit: Payer: Self-pay

## 2022-09-29 ENCOUNTER — Ambulatory Visit (HOSPITAL_COMMUNITY): Payer: BC Managed Care – PPO

## 2022-09-29 ENCOUNTER — Ambulatory Visit (HOSPITAL_COMMUNITY)
Admission: RE | Admit: 2022-09-29 | Discharge: 2022-09-29 | Disposition: A | Discharge status: Home / Self Care | Payer: BC Managed Care – PPO | Source: Ambulatory Visit | Attending: Physician Assistant | Admitting: Physician Assistant

## 2022-09-29 DIAGNOSIS — I824Y2 Acute embolism and thrombosis of unspecified deep veins of left proximal lower extremity: Secondary | ICD-10-CM | POA: Diagnosis not present

## 2022-09-29 DIAGNOSIS — I82432 Acute embolism and thrombosis of left popliteal vein: Secondary | ICD-10-CM | POA: Diagnosis not present

## 2022-10-06 ENCOUNTER — Encounter: Payer: Self-pay | Admitting: Physician Assistant

## 2022-10-06 NOTE — Progress Notes (Signed)
NOTE RE: LEFT LEG ULTRASOUND RESULTS Left leg venous US (09/29/2022) showed "chronic nonocclusive left popliteal DVT."  However, previous left leg [place] (03/15/2022) was negative for any "evidence of acute or chronic DVT within the left lower extremity with special attention paid to the left femoral and popliteal veins."  I spoke with radiology (Dr. Fabiola Backer) for further clarification.  Upon review of both ultrasounds, he confirmed presence of chronic appearing DVT of left popliteal vein on 09/29/2022.  He reports that he did not see any clear evidence of left popliteal DVT on ultrasound from 03/15/2022, but notes that it was a technically difficult study to interpret.  It is unclear if the DVT seen on 09/29/2022 represents residual clot from his prior DVT or was a new DVT/failure of Eliquis.  We will check follow-up ultrasound in 6 months to ensure that there is not any further propagation of this clot.    Given the above (and after discussion with Dr. Delton Coombes), we recommend that the patient remain on full dose anticoagulation indefinitely.  Although his initial DVT was provoked, his substantial vascular injuries may give him lifelong predisposition to blood clots.  Would consider referral to vascular surgery for second opinion and to see if they felt any other intervention of chronic DVT is necessary.  Harriett Rush, PA-C  10/06/22 5:24 PM  **NOTE: Phone call made to the patient on 10/06/2022 at 5:06 PM to discuss the above.  No answer.  Left message.  Will attempt to contact patient again next week.

## 2022-10-11 ENCOUNTER — Other Ambulatory Visit: Payer: Self-pay | Admitting: Physician Assistant

## 2022-10-11 DIAGNOSIS — I82532 Chronic embolism and thrombosis of left popliteal vein: Secondary | ICD-10-CM

## 2022-10-11 NOTE — Progress Notes (Signed)
Called and spoke with patient this afternoon to discuss results and plan as per documentation from 10/06/2022.  Orders placed for referral to vascular surgery as well as for follow-up ultrasound, labs (CBC/D, D-dimer, BMP, lupus anticoagulant panel), follow-up visit in 6 months.  Harriett Rush, PA-C 10/11/22 9:11 PM

## 2022-10-25 ENCOUNTER — Other Ambulatory Visit (HOSPITAL_COMMUNITY): Payer: Self-pay

## 2022-11-07 ENCOUNTER — Encounter (INDEPENDENT_AMBULATORY_CARE_PROVIDER_SITE_OTHER): Payer: Self-pay | Admitting: Vascular Surgery

## 2022-11-07 ENCOUNTER — Ambulatory Visit (INDEPENDENT_AMBULATORY_CARE_PROVIDER_SITE_OTHER): Payer: BC Managed Care – PPO | Admitting: Vascular Surgery

## 2022-11-07 VITALS — BP 135/89 | HR 76 | Resp 16 | Wt 244.0 lb

## 2022-11-07 DIAGNOSIS — I82532 Chronic embolism and thrombosis of left popliteal vein: Secondary | ICD-10-CM | POA: Diagnosis not present

## 2022-11-07 DIAGNOSIS — I82409 Acute embolism and thrombosis of unspecified deep veins of unspecified lower extremity: Secondary | ICD-10-CM | POA: Insufficient documentation

## 2022-11-07 DIAGNOSIS — I2699 Other pulmonary embolism without acute cor pulmonale: Secondary | ICD-10-CM | POA: Diagnosis not present

## 2022-11-07 NOTE — Progress Notes (Signed)
Patient ID: [name], male   DOB: 1990-11-19, 32 y.o.   MRN: [id number]  Chief Complaint  Patient presents with   New Patient (Initial Visit)    Ref Leron Croak consult DVT of popliteal vein of left lower extremity    HPI [name] is a 32 y.o. male.  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     Past Medical History:  Diagnosis Date   DVT (deep venous thrombosis) (HCC)    Staph infection     Past Surgical History:  Procedure Laterality Date   FOOT AMPUTATION THROUGH ANKLE Left    HAND SURGERY     MOUTH SURGERY     MR LOWER LEG LEFT ([facility] HX) Left    Traumatic fracture left leg, internal fixation of left tibia     Family History No history of bleeding disorders, clotting disorders, autoimmune diseases, or aneurysms.   Social History   Tobacco Use   Smoking status: Former    Packs/day: .75    Types: Cigarettes   Smokeless tobacco: Current    Types: Snuff   Tobacco comments:    chews a can per day, smokes 3 cigarettes  Vaping Use   Vaping Use: Never used  Substance Use Topics   Alcohol use: Yes    Alcohol/week: 6.0 standard drinks of alcohol    Types: 6 Cans of beer per week   Drug use: Not Currently     Allergies  Allergen Reactions   Vancomycin     Current Outpatient Medications  Medication Sig Dispense Refill   apixaban (ELIQUIS) 5 MG TABS tablet Take 1 tablet (5 mg total) by mouth 2 (two) times daily. 60 tablet 5   No current facility-administered medications for this visit.      REVIEW OF SYSTEMS (Negative unless checked)  Constitutional: [] Weight loss  [] Fever  [] Chills Cardiac: [] Chest pain   [] Chest pressure   [] Palpitations   [] Shortness of breath when laying flat   [] Shortness of breath at rest   [] Shortness of breath with exertion. Vascular:  [x] Pain in legs with walking   [x] Pain in legs at rest   [] Pain in legs when laying flat   [] Claudication   [] Pain in  feet when walking  [] Pain in feet at rest  [] Pain in feet when laying flat   [x] History of DVT   [] Phlebitis   [] Swelling in legs   [] Varicose veins   [] Non-healing ulcers Pulmonary:   [] Uses home oxygen   [] Productive cough   [] Hemoptysis   [] Wheeze  [] COPD   [] Asthma Neurologic:  [] Dizziness  [] Blackouts   [] Seizures   [] History of stroke   [] History of TIA  [] Aphasia   [] Temporary blindness   [] Dysphagia   [] Weakness or numbness in arms   [] Weakness or numbness in legs Musculoskeletal:  [] Arthritis   [] Joint swelling   [] Joint pain   [] Low back pain Hematologic:  [] Easy bruising  [] Easy bleeding   [] Hypercoagulable state   [] Anemic  [] Hepatitis Gastrointestinal:  [] Blood in stool   [] Vomiting blood  [] Gastroesophageal reflux/heartburn   [] Abdominal pain Genitourinary:  [] Chronic kidney disease   [] Difficult urination  [] Frequent urination  [] Burning with urination   [] Hematuria Skin:  [] Rashes   [] Ulcers   [] Wounds Psychological:  [] History of anxiety   []  History of major depression.    Physical Exam BP 135/89 (BP Location: Right Arm)   Pulse 76   Resp  16   Wt 244 lb (110.7 kg)   BMI 33.09 kg/m  Gen:  WD/WN, NAD Head: [place]/AT, No temporalis wasting.  Ear/Nose/Throat: Hearing grossly intact, nares w/o erythema or drainage, oropharynx w/o Erythema/Exudate Eyes: Conjunctiva clear, sclera non-icteric  Neck: trachea midline.  No JVD.  Pulmonary:  Good air movement, respirations not labored, no use of accessory muscles  Cardiac: RRR, no JVD Vascular:  Vessel Right Left  Radial Palpable Palpable                                   Gastrointestinal:. No masses, surgical incisions, or scars. Musculoskeletal: M/S 5/5 throughout.  Extremities without ischemic changes.  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 No significant LE edema. Neurologic: Sensation grossly intact in extremities.  Symmetrical.  Speech is fluent. Motor exam as listed above. Psychiatric: Judgment intact, Mood & affect  appropriate for pt's clinical situation. Dermatologic: No rashes or ulcers noted.  No cellulitis or open wounds.    Radiology No results found.  Labs Recent Results (from the past 2160 hour(s))  Ferritin     Status: None   Collection Time: 09/15/22 10:55 AM  Result Value Ref Range   Ferritin 27 24 - 336 ng/mL    Comment: Performed at [facility], [address] [place], [place] [address]  Iron and TIBC     Status: Abnormal   Collection Time: 09/15/22 10:55 AM  Result Value Ref Range   Iron 60 45 - 182 ug/dL   TIBC 398 250 - 450 ug/dL   Saturation Ratios 15 (L) 17.9 - 39.5 %   UIBC 338 ug/dL    Comment: Performed at [facility], [address] [place], [place] [address]  CBC with Differential/Platelet     Status: None   Collection Time: 09/15/22 10:56 AM  Result Value Ref Range   WBC 6.2 4.0 - 10.5 K/uL   RBC 5.65 4.22 - 5.81 MIL/uL   Hemoglobin 16.2 13.0 - 17.0 g/dL   HCT 47.9 39.0 - 52.0 %   MCV 84.8 80.0 - 100.0 fL   MCH 28.7 26.0 - 34.0 pg   MCHC 33.8 30.0 - 36.0 g/dL   RDW 13.5 11.5 - 15.5 %   Platelets 255 150 - 400 K/uL   nRBC 0.0 0.0 - 0.2 %   Neutrophils Relative % 52 %   Neutro Abs 3.2 1.7 - 7.7 K/uL   Lymphocytes Relative 35 %   Lymphs Abs 2.2 0.7 - 4.0 K/uL   Monocytes Relative 9 %   Monocytes Absolute 0.5 0.1 - 1.0 K/uL   Eosinophils Relative 3 %   Eosinophils Absolute 0.2 0.0 - 0.5 K/uL   Basophils Relative 1 %   Basophils Absolute 0.1 0.0 - 0.1 K/uL   Immature Granulocytes 0 %   Abs Immature Granulocytes 0.01 0.00 - 0.07 K/uL    Comment: Performed at [facility], [address], [place], [place] [address]  D-dimer, quantitative     Status: None   Collection Time: 09/15/22 10:56 AM  Result Value Ref Range   D-Dimer, Quant 0.37 0.00 - 0.50 ug/mL-FEU    Comment: (NOTE) At the manufacturer cut-off value of 0.5 g/mL FEU, this assay has a negative predictive value of 95-100%.This assay is intended for use in conjunction with a clinical pretest  probability (PTP) assessment model to exclude pulmonary embolism (PE) and deep venous thrombosis (DVT) in outpatients suspected of PE or DVT. Results  should be correlated with clinical presentation. 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  He has a previous history of DVT of the right lower extremity as well.  He has had previous history of DVT and pulmonary embolus about 2 years ago as well.  Although his thromboses were provoked, he had major trauma to his left leg and the fact that he has had multiple thrombotic events and this is common including given his age and significant trauma, I think he should remain on anticoagulant we can consider send antiplatelet regimen at 2.5 mg of Eliquis twice daily, but he should be on this long-term.  He does not have as much postphlebitic swelling and pain as some, but compression socks may be of some benefit if he does develop swelling in the future.  Pulmonary embolism and infarction [facility]) Previous history of PE after a previous DVT in this.  With his previous leg trauma and chronic DVT, fully should remain on at least a prophylactic dose of anticoagulation going forward and is tolerating full dose anticoagulation now which I think is reasonable.      Leotis Pain 11/07/2022, 11:43 AM   This note was created with Dragon medical transcription system.  Any errors from dictation are unintentional.

## 2022-11-07 NOTE — Assessment & Plan Note (Signed)
Previous history of PE after a previous DVT in this.  With his previous leg trauma and chronic DVT, fully should remain on at least a prophylactic dose of anticoagulation going forward and is tolerating full dose anticoagulation now which I think is reasonable.

## 2022-11-07 NOTE — Assessment & Plan Note (Addendum)
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  He has a previous history of DVT of the right lower extremity as well.  He has had previous history of DVT and pulmonary embolus about 2 years ago as well.  Although his thromboses were provoked, he had major trauma to his left leg and the fact that he has had multiple thrombotic events and this is common including given his age and significant trauma, I think he should remain on anticoagulant we can consider send antiplatelet regimen at 2.5 mg of Eliquis twice daily, but he should be on this long-term.  He does not have as much postphlebitic swelling and pain as some, but compression socks may be of some benefit if he does develop swelling in the future.

## 2022-11-16 DIAGNOSIS — S52002K Unspecified fracture of upper end of left ulna, subsequent encounter for closed fracture with nonunion: Secondary | ICD-10-CM | POA: Diagnosis not present

## 2022-11-16 DIAGNOSIS — S52022K Displaced fracture of olecranon process without intraarticular extension of left ulna, subsequent encounter for closed fracture with nonunion: Secondary | ICD-10-CM | POA: Diagnosis not present

## 2022-11-16 DIAGNOSIS — S42302D Unspecified fracture of shaft of humerus, left arm, subsequent encounter for fracture with routine healing: Secondary | ICD-10-CM | POA: Diagnosis not present

## 2022-11-16 DIAGNOSIS — X58XXXD Exposure to other specified factors, subsequent encounter: Secondary | ICD-10-CM | POA: Diagnosis not present

## 2022-12-26 ENCOUNTER — Other Ambulatory Visit (HOSPITAL_COMMUNITY): Payer: Self-pay

## 2023-01-26 ENCOUNTER — Other Ambulatory Visit (HOSPITAL_COMMUNITY): Payer: Self-pay

## 2023-02-19 ENCOUNTER — Other Ambulatory Visit: Payer: Self-pay | Admitting: Family Medicine

## 2023-02-19 ENCOUNTER — Encounter: Payer: Self-pay | Admitting: Family Medicine

## 2023-02-20 ENCOUNTER — Other Ambulatory Visit (HOSPITAL_COMMUNITY): Payer: Self-pay

## 2023-02-20 MED ORDER — APIXABAN 5 MG PO TABS
5.0000 mg | ORAL_TABLET | Freq: Two times a day (BID) | ORAL | 5 refills | Status: DC
  Filled 2023-02-20: qty 60, 30d supply, fill #0
  Filled 2023-03-27: qty 60, 30d supply, fill #1
  Filled 2023-04-30: qty 60, 30d supply, fill #2
  Filled 2023-06-02: qty 60, 30d supply, fill #3

## 2023-02-20 NOTE — Telephone Encounter (Signed)
Nurses Please call Trinna Post  I can work him in today at [employer]

## 2023-02-20 NOTE — Telephone Encounter (Signed)
Per Dr Lorin Picket- unable to work in today due to meeting- Patient can be worked in Friday at 9:30 am- ER or urgent care if worse.

## 2023-02-23 ENCOUNTER — Other Ambulatory Visit (HOSPITAL_COMMUNITY): Payer: Self-pay

## 2023-02-23 ENCOUNTER — Encounter: Payer: Self-pay | Admitting: Family Medicine

## 2023-02-23 ENCOUNTER — Ambulatory Visit (INDEPENDENT_AMBULATORY_CARE_PROVIDER_SITE_OTHER): Payer: BC Managed Care – PPO | Admitting: Family Medicine

## 2023-02-23 VITALS — BP 121/79 | HR 56 | Temp 98.2°F | Ht 72.0 in | Wt 240.4 lb

## 2023-02-23 DIAGNOSIS — L739 Follicular disorder, unspecified: Secondary | ICD-10-CM | POA: Diagnosis not present

## 2023-02-23 DIAGNOSIS — S98922S Partial traumatic amputation of left foot, level unspecified, sequela: Secondary | ICD-10-CM

## 2023-02-23 MED ORDER — MUPIROCIN 2 % EX OINT
1.0000 | TOPICAL_OINTMENT | Freq: Three times a day (TID) | CUTANEOUS | 2 refills | Status: DC
  Filled 2023-02-23: qty 22, 8d supply, fill #0

## 2023-02-23 NOTE — Progress Notes (Signed)
   Subjective:    Patient ID: [name], male    DOB: March 23, 1991, 32 y.o.   MRN: [id number]  HPI PT comes in today for a Follow up per Dr Roby Lofts request. PT states he has no questions or concerns at this time. He relates that he has an area that keeps causing some small red bumps to occur where he has had his partial amputation he denies any bleeding denies any ulcer from it.  No fever or chills.  Review of Systems     Objective:   Physical Exam  Lower leg no sign of blood clot.  No sign of cellulitis there is folliculitis. Overall he should fare well I do not feel there is any need for oral antibiotics today     Assessment & Plan:  Folliculitis Should do well with the Bactroban If worsening issues add a oral antibiotic Due to his partial amputation as well as previous injury his circulation is not as good as it should be so he is at risk of vascular ulcers but currently right now none are seen warning signs were discussed in detail

## 2023-02-23 NOTE — Progress Notes (Signed)
   Subjective:    Patient ID: [name], male    DOB: 12/05/1990, 32 y.o.   MRN: [id number]  HPI    Review of Systems     Objective:   Physical Exam        Assessment & Plan:

## 2023-03-16 ENCOUNTER — Other Ambulatory Visit (HOSPITAL_COMMUNITY): Payer: Self-pay

## 2023-04-12 ENCOUNTER — Ambulatory Visit (HOSPITAL_COMMUNITY)
Admission: RE | Admit: 2023-04-12 | Discharge: 2023-04-12 | Disposition: A | Discharge status: Home / Self Care | Payer: BC Managed Care – PPO | Source: Ambulatory Visit | Attending: Physician Assistant | Admitting: Physician Assistant

## 2023-04-12 ENCOUNTER — Inpatient Hospital Stay: Payer: BC Managed Care – PPO | Attending: Hematology

## 2023-04-12 DIAGNOSIS — E611 Iron deficiency: Secondary | ICD-10-CM | POA: Insufficient documentation

## 2023-04-12 DIAGNOSIS — Z881 Allergy status to other antibiotic agents status: Secondary | ICD-10-CM | POA: Insufficient documentation

## 2023-04-12 DIAGNOSIS — G479 Sleep disorder, unspecified: Secondary | ICD-10-CM | POA: Insufficient documentation

## 2023-04-12 DIAGNOSIS — Z79899 Other long term (current) drug therapy: Secondary | ICD-10-CM | POA: Diagnosis not present

## 2023-04-12 DIAGNOSIS — I82532 Chronic embolism and thrombosis of left popliteal vein: Secondary | ICD-10-CM

## 2023-04-12 DIAGNOSIS — I82512 Chronic embolism and thrombosis of left femoral vein: Secondary | ICD-10-CM | POA: Diagnosis not present

## 2023-04-12 DIAGNOSIS — Z86711 Personal history of pulmonary embolism: Secondary | ICD-10-CM | POA: Diagnosis not present

## 2023-04-12 DIAGNOSIS — D6862 Lupus anticoagulant syndrome: Secondary | ICD-10-CM | POA: Insufficient documentation

## 2023-04-12 DIAGNOSIS — Z87891 Personal history of nicotine dependence: Secondary | ICD-10-CM | POA: Diagnosis not present

## 2023-04-12 DIAGNOSIS — I2699 Other pulmonary embolism without acute cor pulmonale: Secondary | ICD-10-CM | POA: Insufficient documentation

## 2023-04-12 LAB — CBC WITH DIFFERENTIAL/PLATELET
Abs Immature Granulocytes: 0.02 10*3/uL (ref 0.00–0.07)
Basophils Absolute: 0.1 10*3/uL (ref 0.0–0.1)
Basophils Relative: 1 %
Eosinophils Absolute: 0.3 10*3/uL (ref 0.0–0.5)
Eosinophils Relative: 4 %
HCT: 48.8 % (ref 39.0–52.0)
Hemoglobin: 16.3 g/dL (ref 13.0–17.0)
Immature Granulocytes: 0 %
Lymphocytes Relative: 33 %
Lymphs Abs: 2.6 10*3/uL (ref 0.7–4.0)
MCH: 28.9 pg (ref 26.0–34.0)
MCHC: 33.4 g/dL (ref 30.0–36.0)
MCV: 86.5 fL (ref 80.0–100.0)
Monocytes Absolute: 0.5 10*3/uL (ref 0.1–1.0)
Monocytes Relative: 6 %
Neutro Abs: 4.4 10*3/uL (ref 1.7–7.7)
Neutrophils Relative %: 56 %
Platelets: 227 10*3/uL (ref 150–400)
RBC: 5.64 MIL/uL (ref 4.22–5.81)
RDW: 12.9 % (ref 11.5–15.5)
WBC: 7.9 10*3/uL (ref 4.0–10.5)
nRBC: 0 % (ref 0.0–0.2)

## 2023-04-12 LAB — BASIC METABOLIC PANEL
Anion gap: 10 (ref 5–15)
BUN: 10 mg/dL (ref 6–20)
CO2: 25 mmol/L (ref 22–32)
Calcium: 9 mg/dL (ref 8.9–10.3)
Chloride: 102 mmol/L (ref 98–111)
Creatinine, Ser: 0.94 mg/dL (ref 0.61–1.24)
GFR, Estimated: >60 mL/min (ref >60)
Glucose, Bld: 80 mg/dL (ref 70–99)
Potassium: 4.1 mmol/L (ref 3.5–5.1)
Sodium: 137 mmol/L (ref 135–145)

## 2023-04-12 LAB — D-DIMER, QUANTITATIVE: D-Dimer, Quant: 0.42 ug{FEU}/mL (ref 0.00–0.50)

## 2023-04-15 LAB — LUPUS ANTICOAGULANT PANEL
DRVVT: 58.3 s — ABNORMAL HIGH (ref 0.0–47.0)
PTT Lupus Anticoagulant: 45 s — ABNORMAL HIGH (ref 0.0–43.5)

## 2023-04-15 LAB — DRVVT CONFIRM: dRVVT Confirm: 1.2 ratio (ref 0.8–1.2)

## 2023-04-15 LAB — PTT-LA INCUB MIX: PTT-LA Incub Mix: 49.4 s — ABNORMAL HIGH (ref 0.0–40.5)

## 2023-04-15 LAB — HEXAGONAL PHASE PHOSPHOLIPID: Hexagonal Phase Phospholipid: 7 s (ref 0–11)

## 2023-04-15 LAB — DRVVT MIX: dRVVT Mix: 44.4 s — ABNORMAL HIGH (ref 0.0–40.4)

## 2023-04-15 LAB — PTT-LA MIX: PTT-LA Mix: 40.4 s (ref 0.0–40.5)

## 2023-04-19 ENCOUNTER — Inpatient Hospital Stay (HOSPITAL_BASED_OUTPATIENT_CLINIC_OR_DEPARTMENT_OTHER): Payer: BC Managed Care – PPO | Admitting: Oncology

## 2023-04-19 ENCOUNTER — Telehealth: Payer: Self-pay | Admitting: Family Medicine

## 2023-04-19 VITALS — BP 111/82 | HR 70 | Temp 98.4°F | Resp 17 | Ht 72.0 in | Wt 238.0 lb

## 2023-04-19 DIAGNOSIS — I82532 Chronic embolism and thrombosis of left popliteal vein: Secondary | ICD-10-CM

## 2023-04-19 DIAGNOSIS — Z881 Allergy status to other antibiotic agents status: Secondary | ICD-10-CM | POA: Diagnosis not present

## 2023-04-19 DIAGNOSIS — D6862 Lupus anticoagulant syndrome: Secondary | ICD-10-CM | POA: Diagnosis not present

## 2023-04-19 DIAGNOSIS — Z79899 Other long term (current) drug therapy: Secondary | ICD-10-CM | POA: Diagnosis not present

## 2023-04-19 DIAGNOSIS — E611 Iron deficiency: Secondary | ICD-10-CM | POA: Diagnosis not present

## 2023-04-19 DIAGNOSIS — G479 Sleep disorder, unspecified: Secondary | ICD-10-CM | POA: Diagnosis not present

## 2023-04-19 DIAGNOSIS — Z86711 Personal history of pulmonary embolism: Secondary | ICD-10-CM | POA: Diagnosis not present

## 2023-04-19 DIAGNOSIS — Z87891 Personal history of nicotine dependence: Secondary | ICD-10-CM | POA: Diagnosis not present

## 2023-04-19 DIAGNOSIS — I2699 Other pulmonary embolism without acute cor pulmonale: Secondary | ICD-10-CM | POA: Diagnosis not present
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
## 2023-04-19 NOTE — Telephone Encounter (Signed)
Patient had form faxed over to be completed for disability, In your blue folder.

## 2023-04-22 NOTE — Telephone Encounter (Signed)
I filled out multiple forms over the past several days-specifically cannot remember if I have seen this form thank you

## 2023-04-24 NOTE — Telephone Encounter (Signed)
Erica-if the form is currently in the blue folder I will fill it out when I come back  If it is not in the blue folder then I have no idea where this form is.  I checked all of my gas and my work area and I do not find metformin.  If this form is missing I kindly ask that the front call the patient and asked him to bring [place] another form and we will fill it out  Thank you for your assistance with this matter

## 2023-05-02 ENCOUNTER — Encounter: Payer: Self-pay | Admitting: Family Medicine

## 2023-05-03 NOTE — Telephone Encounter (Signed)
Hi [name]  Lets target 11:30 on Monday -we will work you in before lunch with me.  Also I will talk with you a bit about your disability form. I want to make sure that we fill this out reflecting how your doing currently.  The form is asking questions regarding your physical capabilities and limitations regarding your disability (Squatting, walking, lifting, leg use, arm use, strength, pain,etc) Plz consider how your condition is and how it affects your physical capabilities so when we talk we can have a good input regarding this info  See you Monday  Thanks Dr Lorin Picket  Nurses Please schedule Donell Sievert for 11:30 Monday with me Verline Lema: noisy breathing Thank you  Also share message with Trinna Post

## 2023-05-07 ENCOUNTER — Ambulatory Visit (INDEPENDENT_AMBULATORY_CARE_PROVIDER_SITE_OTHER): Payer: BC Managed Care – PPO | Admitting: Family Medicine

## 2023-05-07 ENCOUNTER — Encounter: Payer: Self-pay | Admitting: Family Medicine

## 2023-05-07 VITALS — BP 117/76 | HR 53 | Temp 98.5°F | Wt 241.8 lb

## 2023-05-07 DIAGNOSIS — M25522 Pain in left elbow: Secondary | ICD-10-CM

## 2023-05-07 DIAGNOSIS — M25572 Pain in left ankle and joints of left foot: Secondary | ICD-10-CM | POA: Diagnosis not present

## 2023-05-07 DIAGNOSIS — G8929 Other chronic pain: Secondary | ICD-10-CM

## 2023-05-07 MED ORDER — DOXYCYCLINE HYCLATE 100 MG PO TABS: 100.0000 mg | ORAL_TABLET | Freq: Two times a day (BID) | ORAL | 0 refills | Status: DC
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
## 2023-05-09 ENCOUNTER — Encounter: Payer: Self-pay | Admitting: Family Medicine

## 2023-05-09 NOTE — Telephone Encounter (Signed)
Message copied to patient's chart

## 2023-05-18 DIAGNOSIS — S52022K Displaced fracture of olecranon process without intraarticular extension of left ulna, subsequent encounter for closed fracture with nonunion: Secondary | ICD-10-CM | POA: Diagnosis not present

## 2023-05-18 DIAGNOSIS — S82302D Unspecified fracture of lower end of left tibia, subsequent encounter for closed fracture with routine healing: Secondary | ICD-10-CM | POA: Diagnosis not present

## 2023-05-18 DIAGNOSIS — X58XXXD Exposure to other specified factors, subsequent encounter: Secondary | ICD-10-CM | POA: Diagnosis not present

## 2023-05-18 DIAGNOSIS — S82202N Unspecified fracture of shaft of left tibia, subsequent encounter for open fracture type IIIA, IIIB, or IIIC with nonunion: Secondary | ICD-10-CM | POA: Diagnosis not present

## 2023-05-18 DIAGNOSIS — S52002K Unspecified fracture of upper end of left ulna, subsequent encounter for closed fracture with nonunion: Secondary | ICD-10-CM | POA: Diagnosis not present

## 2023-05-18 DIAGNOSIS — S42452K Displaced fracture of lateral condyle of left humerus, subsequent encounter for fracture with nonunion: Secondary | ICD-10-CM | POA: Diagnosis not present

## 2023-05-18 DIAGNOSIS — S82402N Unspecified fracture of shaft of left fibula, subsequent encounter for open fracture type IIIA, IIIB, or IIIC with nonunion: Secondary | ICD-10-CM | POA: Diagnosis not present

## 2023-05-18 DIAGNOSIS — S82402D Unspecified fracture of shaft of left fibula, subsequent encounter for closed fracture with routine healing: Secondary | ICD-10-CM | POA: Diagnosis not present

## 2023-06-03 ENCOUNTER — Other Ambulatory Visit: Payer: Self-pay | Admitting: Family Medicine

## 2023-06-03 MED ORDER — APIXABAN 5 MG PO TABS: 5.0000 mg | ORAL_TABLET | Freq: Two times a day (BID) | ORAL | 5 refills | Status: DC

## 2023-06-04 ENCOUNTER — Other Ambulatory Visit: Payer: Self-pay | Admitting: Family Medicine

## 2023-06-04 ENCOUNTER — Other Ambulatory Visit: Payer: Self-pay

## 2023-06-04 ENCOUNTER — Other Ambulatory Visit: Payer: Self-pay | Admitting: *Deleted

## 2023-06-04 ENCOUNTER — Other Ambulatory Visit (HOSPITAL_COMMUNITY): Payer: Self-pay

## 2023-06-04 MED ORDER — APIXABAN 5 MG PO TABS
5.0000 mg | ORAL_TABLET | Freq: Two times a day (BID) | ORAL | 5 refills | Status: DC
  Filled 2023-06-04: qty 60, 30d supply, fill #0
  Filled 2023-06-29 – 2023-06-30 (×2): qty 60, 30d supply, fill #1
  Filled 2023-08-01: qty 60, 30d supply, fill #2
  Filled 2023-08-27 – 2023-09-01 (×2): qty 60, 30d supply, fill #3
  Filled 2023-09-28: qty 60, 30d supply, fill #4
  Filled 2023-10-18 (×2): qty 14, 7d supply, fill #5

## 2023-06-04 NOTE — Telephone Encounter (Signed)
Nurses-please verify with pharmacy according to electronic record 5 refills was granted by Dr. Ellin Saba the hematologist if that is so there is no need for [place] to do this refill thank you If there is something else going on then I need to know about please let me know

## 2023-06-05 NOTE — Telephone Encounter (Signed)
Please see previous note from myself yesterday thank you

## 2023-06-06 ENCOUNTER — Other Ambulatory Visit: Payer: Self-pay | Admitting: Family Medicine

## 2023-06-06 ENCOUNTER — Telehealth: Payer: Self-pay

## 2023-06-06 ENCOUNTER — Other Ambulatory Visit (HOSPITAL_COMMUNITY): Payer: Self-pay

## 2023-06-06 NOTE — Telephone Encounter (Signed)
Spoke to patient informing him that his [employer] disability documents had been completed and faxed to the company. Fax confirmation received. Copy of documents mailed to patient as requested.

## 2023-06-11 ENCOUNTER — Other Ambulatory Visit (HOSPITAL_COMMUNITY): Payer: Self-pay

## 2023-06-20 ENCOUNTER — Other Ambulatory Visit: Payer: Self-pay | Admitting: Family Medicine

## 2023-06-20 MED ORDER — CLOBETASOL PROPIONATE 0.05 % EX CREA: 1.0000 | TOPICAL_CREAM | Freq: Two times a day (BID) | CUTANEOUS | 0 refills | Status: DC

## 2023-06-30 ENCOUNTER — Other Ambulatory Visit (HOSPITAL_COMMUNITY): Payer: Self-pay

## 2023-07-02 ENCOUNTER — Other Ambulatory Visit (HOSPITAL_COMMUNITY): Payer: Self-pay

## 2023-07-04 ENCOUNTER — Other Ambulatory Visit (HOSPITAL_COMMUNITY): Payer: Self-pay

## 2023-07-06 ENCOUNTER — Telehealth: Payer: Self-pay

## 2023-07-06 NOTE — Telephone Encounter (Signed)
Nurses Please triage Please find out what is going on with the patient how long is he had the symptoms and what way is it affecting him  Please be aware with COVID whether or not to be seen is based around symptomatology and if having significant shortness of breath or other issues As for Paxlovid-generally this is used for individuals with high risk issues or elderly In his case with him being on Eliquis he would not be able to take Paxil bid because it would interact with Eliquis  Most individuals gradually get better over the course of several days if progressive chest congestion shortness of breath or persistent illness would need to be seen here or ER or urgent care

## 2023-07-06 NOTE — Telephone Encounter (Signed)
Reason for CRM: need recommendation for covid medicine  Please advise

## 2023-07-06 NOTE — Telephone Encounter (Signed)
Called pt to discuss symptoms per Dr Lorin Picket, pt said he feels better but the congestion has stuck around since Monday, Set sick visit up with Dr Adriana Simas for Monday 07/09/2023. Asked Dr Tyrone Sage for Recommendations for OTC medication and he suggested plan tylenol or Robitussin DM. Called pt back to inform of recommend.

## 2023-07-09 ENCOUNTER — Ambulatory Visit: Payer: BC Managed Care – PPO | Admitting: Family Medicine

## 2023-08-01 ENCOUNTER — Telehealth: Payer: Self-pay | Admitting: Family Medicine

## 2023-08-01 NOTE — Telephone Encounter (Signed)
[employer] sent over paper for patient needing update for his claim. He was last 02/23/23. Form is in red folder in your box.

## 2023-08-06 NOTE — Telephone Encounter (Signed)
Hi-I forwarded this form back up to the front.  Please connect with patient let him know that his insurance company is requesting up-to-date information they are requesting information from September to present regarding his current situation the last time we saw him was in July and that was not a detailed visit in order to improve the likelihood of him getting his disability I would recommend an office visit in one of my open slots before the end of the month please thank you  If patient defers we can only send what we got which is a short visit in July and any other old records that the insurance company work wants to thank you

## 2023-08-27 ENCOUNTER — Other Ambulatory Visit (HOSPITAL_COMMUNITY): Payer: Self-pay

## 2023-08-30 ENCOUNTER — Ambulatory Visit: Payer: BC Managed Care – PPO | Admitting: Family Medicine

## 2023-09-01 ENCOUNTER — Other Ambulatory Visit (HOSPITAL_COMMUNITY): Payer: Self-pay

## 2023-09-03 ENCOUNTER — Other Ambulatory Visit (HOSPITAL_COMMUNITY): Payer: Self-pay

## 2023-09-12 ENCOUNTER — Ambulatory Visit: Payer: BC Managed Care – PPO | Admitting: Family Medicine

## 2023-10-18 ENCOUNTER — Other Ambulatory Visit (HOSPITAL_COMMUNITY): Payer: Self-pay

## 2023-10-18 ENCOUNTER — Telehealth: Payer: Self-pay | Admitting: Emergency Medicine

## 2023-10-29 ENCOUNTER — Ambulatory Visit: Payer: BC Managed Care – PPO | Admitting: Family Medicine

## 2023-10-29 VITALS — BP 125/85 | HR 65 | Temp 98.4°F | Ht 72.0 in | Wt 237.0 lb

## 2023-10-29 DIAGNOSIS — R5383 Other fatigue: Secondary | ICD-10-CM | POA: Diagnosis not present

## 2023-10-29 DIAGNOSIS — R1030 Lower abdominal pain, unspecified: Secondary | ICD-10-CM | POA: Diagnosis not present

## 2023-10-29 DIAGNOSIS — R400 Somnolence: Secondary | ICD-10-CM | POA: Diagnosis not present

## 2023-10-29 MED ORDER — DOXYCYCLINE HYCLATE 100 MG PO TABS: 100.0000 mg | ORAL_TABLET | Freq: Two times a day (BID) | ORAL | 0 refills | Status: DC

## 2023-10-29 NOTE — Progress Notes (Signed)
   Subjective:    Patient ID: [name], male    DOB: 1991/01/11, 33 y.o.   MRN: [id number]  Discussed the use of AI scribe software for clinical note transcription with the patient, who gave verbal consent to proceed.  History of Present Illness   The patient presents with intermittent side pain and fatigue.  He experiences intermittent side pain described as a burning sensation, similar to 'wind burn', with surface sensitivity. The pain occurs sporadically, lasting about two to three minutes each time, and is associated with nausea and occasional dizziness. This began after stumbling on his leg, although the leg pain has resolved.  He feels unusually fatigued, particularly by midday, despite feeling refreshed in the morning. He attributes this to frequent nocturnal awakenings to use the bathroom. He snores and has been informed by his wife about the snoring, but she has not observed any apnea episodes. No excessive daytime sleepiness, such as while driving, is reported.  He notes a change in bowel habits, with less frequent bowel movements than usual. His stools are mainly soft, but he has experienced recent hemorrhoid flare-ups. He acknowledges a diet low in fiber and high in sodas, which he is attempting to modify.  He reports a decrease in appetite, attributing it to being busy rather than a lack of hunger. He typically feels hungry three times a day but does not always eat three meals.  He mentions a recurring issue with boils, having experienced one previously and now noticing another. He showers daily and inquires about the cause of these boils.         Review of Systems     Objective:    Physical Exam   MEASUREMENTS: Weight- 230.   Lungs are clear respiratory rate normal heart regular abdomen is soft no guarding or rebound       Assessment & Plan:  Assessment and Plan    Intermittent Abdominal Pain Intermittent abdominal pain with nausea and burning sensation.  Differential includes gastrointestinal issues or musculoskeletal strain. Abdominal scan not necessary. - Order blood work for kidney and liver function. - Advise increased dietary fiber intake. - Recommend fiber supplements if needed.  Recurrent Skin Abscess Recurrent skin abscess managed with antibiotics and warm compresses. Doxycycline compatible with Eliquis and methotrexate. - Prescribe doxycycline 100 mg twice daily for 7 days. - Advise warm compresses 10-15 minutes several times daily.  Sleep Apnea Suspected sleep apnea causing fatigue. Discussed sleep study and CPAP. Explained risks of untreated sleep apnea. - Advise monitoring for breathing pauses. - Instruct to report breathing pauses for sleep study consideration. - Discuss weight management.  Leg Pain Leg pain after standing, possibly due to worn-out shoe inserts or leg brace. Discussed contacting [facility] and referral to physical medicine. - Refer to physical medicine doctor for evaluation. - Advise contacting [facility] for shoe insert replacement.  Hemorrhoids Hemorrhoid flare-ups possibly due to decreased bowel movements. Suggested fiber intake increase. - Advise increasing dietary fiber intake. - Recommend fiber supplements if needed.      His wife will monitor him regarding sleeping at night to see if he is developing some sleep apnea.  If so he will let us know and we will set up study

## 2023-10-30 ENCOUNTER — Other Ambulatory Visit (HOSPITAL_COMMUNITY): Payer: Self-pay

## 2023-11-01 DIAGNOSIS — R1030 Lower abdominal pain, unspecified: Secondary | ICD-10-CM | POA: Diagnosis not present

## 2023-11-01 DIAGNOSIS — R5383 Other fatigue: Secondary | ICD-10-CM | POA: Diagnosis not present

## 2023-11-02 ENCOUNTER — Emergency Department (HOSPITAL_COMMUNITY)

## 2023-11-02 ENCOUNTER — Encounter: Payer: Self-pay | Admitting: Family Medicine

## 2023-11-02 ENCOUNTER — Emergency Department (HOSPITAL_COMMUNITY)
Admission: EM | Admit: 2023-11-02 | Discharge: 2023-11-02 | Disposition: A | Discharge status: Home / Self Care | Attending: Emergency Medicine | Admitting: Emergency Medicine

## 2023-11-02 ENCOUNTER — Other Ambulatory Visit: Payer: Self-pay

## 2023-11-02 ENCOUNTER — Telehealth: Payer: Self-pay

## 2023-11-02 DIAGNOSIS — R109 Unspecified abdominal pain: Secondary | ICD-10-CM | POA: Diagnosis not present

## 2023-11-02 DIAGNOSIS — X58XXXA Exposure to other specified factors, initial encounter: Secondary | ICD-10-CM | POA: Insufficient documentation

## 2023-11-02 DIAGNOSIS — S29019A Strain of muscle and tendon of unspecified wall of thorax, initial encounter: Secondary | ICD-10-CM

## 2023-11-02 DIAGNOSIS — S29012A Strain of muscle and tendon of back wall of thorax, initial encounter: Secondary | ICD-10-CM | POA: Diagnosis not present

## 2023-11-02 DIAGNOSIS — Z7901 Long term (current) use of anticoagulants: Secondary | ICD-10-CM | POA: Insufficient documentation

## 2023-11-02 DIAGNOSIS — M545 Low back pain, unspecified: Secondary | ICD-10-CM | POA: Diagnosis not present

## 2023-11-02 LAB — CBC WITH DIFFERENTIAL/PLATELET
Abs Immature Granulocytes: 0.03 10*3/uL (ref 0.00–0.07)
Basophils Absolute: 0.1 10*3/uL (ref 0.0–0.1)
Basophils Relative: 1 %
Eosinophils Absolute: 0.2 10*3/uL (ref 0.0–0.5)
Eosinophils Relative: 2 %
HCT: 47.1 % (ref 39.0–52.0)
Hemoglobin: 16.3 g/dL (ref 13.0–17.0)
Immature Granulocytes: 0 %
Lymphocytes Relative: 35 %
Lymphs Abs: 3.3 10*3/uL (ref 0.7–4.0)
MCH: 29.5 pg (ref 26.0–34.0)
MCHC: 34.6 g/dL (ref 30.0–36.0)
MCV: 85.2 fL (ref 80.0–100.0)
Monocytes Absolute: 0.6 10*3/uL (ref 0.1–1.0)
Monocytes Relative: 6 %
Neutro Abs: 5.2 10*3/uL (ref 1.7–7.7)
Neutrophils Relative %: 56 %
Platelets: 211 10*3/uL (ref 150–400)
RBC: 5.53 MIL/uL (ref 4.22–5.81)
RDW: 12.7 % (ref 11.5–15.5)
WBC: 9.3 10*3/uL (ref 4.0–10.5)
nRBC: 0 % (ref 0.0–0.2)

## 2023-11-02 LAB — TSH+FREE T4
Free T4: 1.2 ng/dL (ref 0.82–1.77)
TSH: 0.558 u[IU]/mL (ref 0.450–4.500)

## 2023-11-02 LAB — COMPREHENSIVE METABOLIC PANEL
ALT: 16 IU/L (ref 0–44)
ALT: 18 U/L (ref 0–44)
AST: 17 IU/L (ref 0–40)
AST: 20 U/L (ref 15–41)
Albumin: 4.9 g/dL (ref 4.3–5.2)
Albumin: 4.5 g/dL (ref 3.5–5.0)
Alkaline Phosphatase: 98 IU/L (ref 44–121)
Alkaline Phosphatase: 73 U/L (ref 38–126)
Anion gap: 11 (ref 5–15)
BUN/Creatinine Ratio: 8 — ABNORMAL LOW (ref 9–20)
BUN: 8 mg/dL (ref 6–20)
BUN: 12 mg/dL (ref 6–20)
Bilirubin Total: 0.5 mg/dL (ref 0.0–1.2)
CO2: 21 mmol/L (ref 20–29)
CO2: 25 mmol/L (ref 22–32)
Calcium: 9.5 mg/dL (ref 8.7–10.2)
Calcium: 9.2 mg/dL (ref 8.9–10.3)
Chloride: 102 mmol/L (ref 96–106)
Chloride: 102 mmol/L (ref 98–111)
Creatinine, Ser: 1 mg/dL (ref 0.76–1.27)
Creatinine, Ser: 1.05 mg/dL (ref 0.61–1.24)
GFR, Estimated: >60 mL/min (ref >60)
Globulin, Total: 2.7 g/dL (ref 1.5–4.5)
Glucose, Bld: 77 mg/dL (ref 70–99)
Glucose: 95 mg/dL (ref 70–99)
Potassium: 3.8 mmol/L (ref 3.5–5.2)
Potassium: 3.9 mmol/L (ref 3.5–5.1)
Sodium: 141 mmol/L (ref 134–144)
Sodium: 138 mmol/L (ref 135–145)
Total Bilirubin: 0.5 mg/dL (ref 0.0–1.2)
Total Protein: 7.6 g/dL (ref 6.0–8.5)
Total Protein: 7.5 g/dL (ref 6.5–8.1)
eGFR: 104 mL/min/{1.73_m2} (ref >59)

## 2023-11-02 LAB — CBC
Hematocrit: 50.3 % (ref 37.5–51.0)
Hemoglobin: 17.2 g/dL (ref 13.0–17.7)
MCH: 29.7 pg (ref 26.6–33.0)
MCHC: 34.2 g/dL (ref 31.5–35.7)
MCV: 87 fL (ref 79–97)
Platelets: 232 10*3/uL (ref 150–450)
RBC: 5.8 x10E6/uL (ref 4.14–5.80)
RDW: 13 % (ref 11.6–15.4)
WBC: 8.9 10*3/uL (ref 3.4–10.8)

## 2023-11-02 LAB — URINALYSIS, ROUTINE W REFLEX MICROSCOPIC
Bacteria, UA: NONE SEEN
Bilirubin Urine: NEGATIVE
Glucose, UA: NEGATIVE mg/dL
Ketones, ur: NEGATIVE mg/dL
Leukocytes,Ua: NEGATIVE
Nitrite: NEGATIVE
Protein, ur: NEGATIVE mg/dL
Specific Gravity, Urine: 1.024 (ref 1.005–1.030)
pH: 5 (ref 5.0–8.0)

## 2023-11-02 LAB — LIPASE, BLOOD: Lipase: 33 U/L (ref 11–51)

## 2023-11-02 MED ORDER — LIDOCAINE 5 % EX PTCH
1.0000 | MEDICATED_PATCH | CUTANEOUS | 0 refills | Status: DC
Start: 2023-11-02 — End: 2024-02-27

## 2023-11-02 MED ORDER — IOHEXOL 300 MG/ML  SOLN
100.0000 mL | Freq: Once | INTRAMUSCULAR | Status: AC | PRN
  Administered 2023-11-02: 100 mL via INTRAVENOUS

## 2023-11-02 NOTE — ED Provider Triage Note (Signed)
 Emergency Medicine Provider Triage Evaluation Note  [name] , a 33 y.o. male  was evaluated in triage.  Pt complains of bilateral lower back pain with radiation into his abdomen.  Patient notes his symptoms have been ongoing for approximate the past week.  He notes that he does get some intermittent radiation of the pain into his legs.  Review of Systems  Positive: Back pain and abdominal pain Negative: Urinary bowel incontinence, saddle paresthesias, gait changes  Physical Exam  BP 122/83   Pulse 65   Temp 98.7 F (37.1 C) (Oral)   Ht 6' (1.829 m)   Wt 107 kg   SpO2 97%   BMI 32.01 kg/m  Gen:   Awake, no distress   Resp:  Normal effort  MSK:   Moves extremities without difficulty  Other:    Medical Decision Making  Medically screening exam initiated at 5:44 PM.  Appropriate orders placed.  [name] was informed that the remainder of the evaluation will be completed by another provider, this initial triage assessment does not replace that evaluation, and the importance of remaining in the ED until their evaluation is complete.  Labs and imaging have been ordered.  Awaiting bed in the back at this time.   Lelon Perla, PA-C 11/02/23 1744

## 2023-11-02 NOTE — ED Notes (Signed)
 Pt recently returned from imaging. Awaiting results.

## 2023-11-02 NOTE — ED Triage Notes (Signed)
 Pt stated that he has been having lower back pain that goes from side to side and both hands started tingling on the top around 1500 today. Pt has hx of blood clots and states that the back pain feels similar to having PEs

## 2023-11-02 NOTE — Telephone Encounter (Signed)
 Came in other day for visit was having pain in lower back and side. It has not improved. Hands are feeling tingly on top of them. Patient is concerned for blood clots. Patient has a past history of blood clots.Currently taking Eliquis 5mg . Patient advised to go to urgent care or ED for further evaluation.

## 2023-11-02 NOTE — ED Notes (Signed)
 See triage notes. No resp distress or shob noted at this time.
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
## 2023-11-02 NOTE — Discharge Instructions (Signed)
 Your lab tests and CT scan today are reassuring as discussed as is your exam.  I suspect your pain is secondary to musculoskeletal strain or possibly a pinched nerve in your middle back.  I am prescribing a lidoderm patch which may help your pain.  Also suggest arthritis strength Tylenol.  Plan follow-up with your primary provider if your symptoms persist or worsen in any way.

## 2023-11-05 ENCOUNTER — Telehealth: Payer: Self-pay | Admitting: Physician Assistant

## 2023-11-05 NOTE — Telephone Encounter (Signed)
 Patient contacted this afternoon, after he requested provider call per triage nurse this morning.  Patient reports that he has had some back pain, leg pain, and numbness and tingling in his hands for the past week.  He denies any shortness of breath, dyspnea, new onset chest pain, or peripheral edema.  He is taking his blood thinner as prescribed.  Patient was concerned due to his history of blood clots.  Offered reassurance that the symptoms do not sound typical of blood clots, but that if he experiences any new onset peripheral edema, chest pain, or dyspnea, he should proceed to emergency department for additional evaluation.  Recommend ongoing follow-up with PCP in the meantime.  Rojelio Brenner PA-C 11/05/2023 4:06 PM

## 2023-11-05 NOTE — Telephone Encounter (Signed)
 Copied from CRM [id number]. Topic: General - Other >> Nov 05, 2023 11:00 AM Santiya F wrote: Reason for CRM: Patient is calling in requesting his provider give him a call on Wednesday when he returns to the office. Patient says he needs to ask him a few questions.  -- spoke with the patient , who states he was seen for side pain as well as back pain , he now has been having tingling in both hands, he wants to know what to do next. He states his concern was to make sure he wasn't having any clots or if he needs a referral for his hand tingling. Please advise

## 2023-11-08 ENCOUNTER — Other Ambulatory Visit (HOSPITAL_COMMUNITY): Payer: Self-pay

## 2023-11-08 ENCOUNTER — Other Ambulatory Visit: Payer: Self-pay | Admitting: Family Medicine

## 2023-11-08 MED ORDER — APIXABAN 5 MG PO TABS
5.0000 mg | ORAL_TABLET | Freq: Two times a day (BID) | ORAL | 5 refills | Status: DC
Start: 2023-11-08 — End: 2024-05-05
  Filled 2023-11-08: qty 60, 30d supply, fill #0
  Filled 2023-12-13: qty 60, 30d supply, fill #1
  Filled 2024-01-10: qty 60, 30d supply, fill #2
  Filled 2024-02-11: qty 60, 30d supply, fill #3
  Filled 2024-03-12: qty 60, 30d supply, fill #4
  Filled 2024-04-09: qty 60, 30d supply, fill #5

## 2023-11-11 NOTE — Telephone Encounter (Signed)
 Nurses-go ahead with referral to Dr. 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

## 2023-11-11 NOTE — Telephone Encounter (Signed)
 I had phone conversation with patient we are working on set him up with physical medicine specialist in [place]

## 2023-11-12 ENCOUNTER — Other Ambulatory Visit: Payer: Self-pay

## 2023-11-12 DIAGNOSIS — R202 Paresthesia of skin: Secondary | ICD-10-CM

## 2023-11-12 DIAGNOSIS — S98922S Partial traumatic amputation of left foot, level unspecified, sequela: Secondary | ICD-10-CM

## 2023-11-12 DIAGNOSIS — M792 Neuralgia and neuritis, unspecified: Secondary | ICD-10-CM

## 2023-11-15 ENCOUNTER — Other Ambulatory Visit (HOSPITAL_COMMUNITY): Payer: Self-pay

## 2023-12-06 ENCOUNTER — Encounter: Payer: Self-pay | Admitting: Physical Medicine & Rehabilitation

## 2023-12-21 ENCOUNTER — Encounter: Payer: Self-pay | Admitting: Family Medicine

## 2023-12-28 ENCOUNTER — Telehealth: Payer: Self-pay | Admitting: Family Medicine

## 2023-12-28 ENCOUNTER — Ambulatory Visit: Payer: Self-pay

## 2023-12-28 NOTE — Telephone Encounter (Unsigned)
 Copied from CRM [id number]. Topic: Clinical - Medication Refill >> Dec 28, 2023  3:37 PM Antwanette L wrote: Medication: lidocaine  (LIDODERM ) 5 %  Has the patient contacted their pharmacy? Yes   This is the patient's preferred pharmacy:  [id number] - [place], [place] - [address] AT [facility] [address] [place] [address] Phone: [phone] Fax: [phone]   Is this the correct pharmacy for this prescription? Yes  Has the prescription been filled recently? No. Last refilled on 11/02/23 but the patient never pick up the patches.  Is the patient out of the medication? Yes  Has the patient been seen for an appointment in the last year OR does the patient have an upcoming appointment? Yes  Can we respond through MyChart? No. Contact patient by phone [phone]  Agent: Please be advised that Rx refills may take up to 3 business days. We ask that you follow-up with your pharmacy.

## 2023-12-28 NOTE — Telephone Encounter (Signed)
  Chief Complaint: back pain Symptoms: upper back, L shoulder/neck Frequency: x1 day Pertinent Negatives: Patient denies fever, CP, SOB, neuro sx Disposition: [] ED /[x] Urgent Care (no appt availability in office) / [] Appointment(In office/virtual)/ []  [facility]/ [] Home Care/ [] Refused Recommended Disposition /[] [facility]/ [x]  Follow-up with PCP Additional Notes: Pt c/o of upper back pain that shoots to the L shoulder/neck area x 1 day. Pt reports going to the hospital  for similar sx (dx of pinched nerve per pt) and was prescribed lidocaine  patches. Pt never got medication filled d/t sx improving. Now pt experiencing similar sx and requesting lidocaine  patches from PCP. Triager will forward encounter for Dr. Fairy Homer 's office to review and advise. Patient verbalized understanding and is expecting call back from office if able to fill RX. Triager also advised that if pt does not hear back from office, to follow disposition for further evaluation/treatment.    Copied from CRM [id number]. Topic: Clinical - Medication Question >> Dec 28, 2023  3:32 PM Antwanette L wrote: Reason for CRM: Patient wants to know if Dr.Luking can prescribe lidocaine  patches. Patient is currently using icy hot  on his back but its not working. Patient can be contacted by phone at [phone]   [facility] [address] [place] [address] Phone: [phone] Fax: [phone] Reason for Disposition  [1] SEVERE back pain (e.g., excruciating, unable to do any normal activities) AND [2] not improved 2 hours after pain medicine  Answer Assessment - Initial Assessment Questions 1. ONSET: "When did the pain begin?"      Last night 2. LOCATION: "Where does it hurt?" (upper, mid or lower back)     Upper back, left shoulder up into neck 3. SEVERITY: "How bad is the pain?"  (e.g., Scale 1-10; mild, moderate, or severe)   - MILD (1-3): Doesn't interfere with normal activities.    - MODERATE (4-7):  Interferes with normal activities or awakens from sleep.    - SEVERE (8-10): Excruciating pain, unable to do any normal activities.      6-7/10 4. PATTERN: "Is the pain constant?" (e.g., yes, no; constant, intermittent)      constant 5. RADIATION: "Does the pain shoot into your legs or somewhere else?"     denies 6. CAUSE:  "What do you think is causing the back pain?"      Reports hospital is a pinched nerve 7. BACK OVERUSE:  "Any recent lifting of heavy objects, strenuous work or exercise?"     denies 8. MEDICINES: "What have you taken so far for the pain?" (e.g., nothing, acetaminophen , NSAIDS)     Icy hot topical Has not used PO meds 9. NEUROLOGIC SYMPTOMS: "Do you have any weakness, numbness, or problems with bowel/bladder control?"     denies 10. OTHER SYMPTOMS: "Do you have any other symptoms?" (e.g., fever, abdomen pain, burning with urination, blood in urine)       denies  Protocols used: Back Pain-A-AH

## 2024-01-10 ENCOUNTER — Other Ambulatory Visit (HOSPITAL_COMMUNITY): Payer: Self-pay

## 2024-02-01 ENCOUNTER — Encounter: Attending: Physical Medicine & Rehabilitation | Admitting: Physical Medicine & Rehabilitation

## 2024-02-12 ENCOUNTER — Encounter: Payer: Self-pay | Admitting: Family Medicine

## 2024-02-12 ENCOUNTER — Other Ambulatory Visit (HOSPITAL_COMMUNITY): Payer: Self-pay

## 2024-02-27 ENCOUNTER — Ambulatory Visit: Admitting: Family Medicine

## 2024-02-27 VITALS — BP 106/78 | HR 45 | Temp 98.1°F | Ht 72.0 in | Wt 220.2 lb

## 2024-02-27 DIAGNOSIS — M792 Neuralgia and neuritis, unspecified: Secondary | ICD-10-CM | POA: Diagnosis not present

## 2024-02-27 DIAGNOSIS — R0602 Shortness of breath: Secondary | ICD-10-CM

## 2024-02-27 DIAGNOSIS — F17290 Nicotine dependence, other tobacco product, uncomplicated: Secondary | ICD-10-CM | POA: Diagnosis not present

## 2024-02-27 DIAGNOSIS — R059 Cough, unspecified: Secondary | ICD-10-CM | POA: Diagnosis not present

## 2024-02-27 DIAGNOSIS — T84018A Broken internal joint prosthesis, other site, initial encounter: Secondary | ICD-10-CM | POA: Diagnosis not present

## 2024-02-27 DIAGNOSIS — S98922S Partial traumatic amputation of left foot, level unspecified, sequela: Secondary | ICD-10-CM

## 2024-02-27 NOTE — Progress Notes (Signed)
 Subjective:    Patient ID: [name], male    DOB: 1991/02/01, 33 y.o.   MRN: [id number]  HPI Pt comes in today to discuss appointment with Dr Murray Collier physical medicine. Allergies and medications reviewed. Discussed the use of AI scribe software for clinical note transcription with the patient, who gave verbal consent to proceed.  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  He has significant issues with his current orthotic brace, which he has had for several years. The brace is described as 'worn out' and causes discomfort, feeling like he is 'stepping on a nail' when walking. Attempts to modify the brace himself have been unsuccessful, and without it, he would need a crutch for support. He experiences pain primarily when standing or walking, which subsides when sitting down and removing the shoe. Occasional sharp nerve-like sensations in his foot occur while sitting, which are brief and intermittent.  He has difficulty finding suitable shoes that accommodate the brace. He previously used a specific type of shoe from [employer], which is no longer available. Attempts to fit the brace into new shoes have been unsuccessful, as the shoes do not fit properly without modification.  He recalls a recent fall on wet grass about two months ago, resulting in knee and arm pain lasting about a month. He avoids walking on uneven or wet surfaces and considers using a walking stick for stability.  He has a history of sleep disturbances, previously experiencing excessive tiredness despite adequate sleep, but this has resolved. He snores only when on his back, according to his wife.  He is on a blood thinner and reports no significant issues, although he occasionally notices blood on toilet paper. He has improved his bowel movements by increasing fiber intake, which has alleviated previous constipation and  stomach pain.  He describes episodes of feeling short of breath, which occur sporadically and last less than a minute. These episodes are not associated with stress and occur even when he is at rest. He also experiences intermittent coughing spells, which can last for a few days at a time.  He uses tobacco products heavily, consuming at least three cans a day, and acknowledges the need to reduce his usage. He has tried nicotine  pouches but finds them unsatisfactory compared to his usual tobacco products.     Based on his symptoms I truly doubt a blood clot he is on a blood thinner..  I doubt severe underlying lung disease but ordering a chest x-ray and pulmonary function tests is reasonable may or may not need pulmonary consult  Review of Systems     Objective:   Physical Exam Lungs clear heart regular Should be noted he has partial amputation of the left foot with a callus on the left outer aspect.  Some edema but not severe       Assessment & Plan:  His daytime somnolence has receded no need to do any additional testing  Patient states he needs a new brace we will try to touch base with the physical medicine specialist to see who they use for braces so we can try to get patient a updated brace and updated shoes  He has an appointment with the physical medicine specialist in August  Assessment and Plan    Orthotic device malfunction Orthotic brace for partial foot amputation is worn out, causing pain and increased fall risk. Insurance replacement not available  for seven years. - Consult physical medicine for orthotics specialist referral. - Coordinate with orthotics clinic for brace assessment. - Explore suitable footwear options.  Intermittent shortness of breath and cough Symptoms inconsistent with COPD; tobacco use may contribute. Discussed stress as a possible factor and need to rule out lung conditions. - Order chest x-ray. - Schedule lung function test. - Consider  pulmonology referral if needed.  Nicotine  dependence Uses three cans of smokeless tobacco daily. Discussed health risks and benefits of quitting. Advised on tapering and nicotine  replacement options. - Advise gradual reduction to one can per day. - Discuss Wellbutrin for nicotine  cravings. - Encourage support systems for quitting. - Consider nicotine  pouches as alternative.  General Health Maintenance Improved energy levels, compliant with blood thinner, adjusted fiber intake for bowel regularity. - Continue blood thinner regimen, monitor for bleeding. - Maintain dietary fiber intake.     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

## 2024-02-28 ENCOUNTER — Other Ambulatory Visit: Payer: Self-pay

## 2024-02-28 DIAGNOSIS — R059 Cough, unspecified: Secondary | ICD-10-CM

## 2024-02-28 DIAGNOSIS — R0602 Shortness of breath: Secondary | ICD-10-CM

## 2024-03-06 ENCOUNTER — Telehealth: Payer: Self-pay | Admitting: Family Medicine

## 2024-03-06 NOTE — Telephone Encounter (Signed)
 Patient has a partial amputation left foot He wears a specialized brace that he got through the [facility] in [place]  He states his brace is wearing out and not working properly  Please call this clinic in [place] [phone]  Please asked them how the patient can go about getting a new brace? Is this something that we do a referral or write a prescription and fax it and the patient goes there?  Just trying to find out the process-thanks for assisting

## 2024-03-08 ENCOUNTER — Encounter: Payer: Self-pay | Admitting: Family Medicine

## 2024-03-11 ENCOUNTER — Ambulatory Visit: Payer: Self-pay | Admitting: Family Medicine

## 2024-03-11 ENCOUNTER — Ambulatory Visit (HOSPITAL_COMMUNITY)
Admission: RE | Admit: 2024-03-11 | Discharge: 2024-03-11 | Disposition: A | Discharge status: Home / Self Care | Source: Ambulatory Visit | Attending: Family Medicine | Admitting: Family Medicine

## 2024-03-11 ENCOUNTER — Encounter: Payer: Self-pay | Admitting: Family Medicine

## 2024-03-11 DIAGNOSIS — R059 Cough, unspecified: Secondary | ICD-10-CM | POA: Insufficient documentation

## 2024-03-11 DIAGNOSIS — R0602 Shortness of breath: Secondary | ICD-10-CM | POA: Insufficient documentation

## 2024-03-11 LAB — PULMONARY FUNCTION TEST
DL/VA % pred: 114 %
DL/VA: 5.65 ml/min/mmHg/L
DLCO unc % pred: 90 %
DLCO unc: 31.84 ml/min/mmHg
FEF 25-75 Pre: 4.19 L/s
FEF2575-%Pred-Pre: 89 %
FEV1-%Pred-Pre: 86 %
FEV1-Pre: 4.09 L
FEV1FVC-%Pred-Pre: 103 %
FEV6-%Pred-Pre: 84 %
FEV6-Pre: 4.82 L
FEV6FVC-%Pred-Pre: 101 %
FVC-%Pred-Pre: 82 %
FVC-Pre: 4.83 L
Pre FEV1/FVC ratio: 85 %
Pre FEV6/FVC Ratio: 100 %
RV % pred: 98 %
RV: 1.71 L
TLC % pred: 88 %
TLC: 6.43 L

## 2024-03-13 ENCOUNTER — Other Ambulatory Visit: Payer: Self-pay | Admitting: Nurse Practitioner

## 2024-03-13 MED ORDER — MUPIROCIN 2 % EX OINT
TOPICAL_OINTMENT | CUTANEOUS | 0 refills | Status: DC
Start: 2024-03-13 — End: 2024-04-17

## 2024-03-24 ENCOUNTER — Ambulatory Visit (HOSPITAL_COMMUNITY)
Admission: RE | Admit: 2024-03-24 | Discharge: 2024-03-24 | Disposition: A | Discharge status: Home / Self Care | Source: Ambulatory Visit | Attending: Family Medicine | Admitting: Family Medicine

## 2024-03-24 DIAGNOSIS — R059 Cough, unspecified: Secondary | ICD-10-CM | POA: Diagnosis not present

## 2024-03-24 DIAGNOSIS — R0602 Shortness of breath: Secondary | ICD-10-CM | POA: Insufficient documentation

## 2024-03-27 ENCOUNTER — Encounter: Attending: Physician Assistant | Admitting: Physician Assistant

## 2024-03-27 DIAGNOSIS — Z7901 Long term (current) use of anticoagulants: Secondary | ICD-10-CM | POA: Insufficient documentation

## 2024-03-27 DIAGNOSIS — Z86718 Personal history of other venous thrombosis and embolism: Secondary | ICD-10-CM | POA: Insufficient documentation

## 2024-03-27 DIAGNOSIS — L988 Other specified disorders of the skin and subcutaneous tissue: Secondary | ICD-10-CM | POA: Diagnosis not present

## 2024-03-28 ENCOUNTER — Telehealth: Payer: Self-pay

## 2024-03-28 ENCOUNTER — Ambulatory Visit: Payer: Self-pay

## 2024-03-28 NOTE — Telephone Encounter (Signed)
 Called patient about CRM message and phone call of chest pain, SOB. Instructed patient that he needs to be evaluated in ED or UC based on symptoms and past history. He wanted to know what they would do for him at either. Explained to patient that it would be up to the provider who evaluates him to decide what to do. Patient unhappily agreed to get evaluated at either ED or UC.

## 2024-03-28 NOTE — Telephone Encounter (Signed)
 FYI Only or Action Required?: Action required by provider: update on patient condition and lab or test result follow-up needed.  Patient was last seen in primary care on 02/27/2024 by Alphonsa Glendia LABOR, MD.  Called Nurse Triage reporting Shortness of Breath, Bradycardia, and Cough.  Symptoms began several months ago.  Interventions attempted: Other: patient states he saw his PCP for the symptoms, had a pulmonary function test and Xray but has not heard back on his chest xray results.  Symptoms are: intermittent chest pain, SOB intermittent worse when coughing and at night, cough x months, bradycardia (HR 50 yesterday in Dr's office) gradually worsening.  Triage Disposition: Go to ED Now (Notify PCP)  Patient/caregiver understands and will follow disposition?:  No, wishes to speak with PCP.       Copied from CRM [id number]. Topic: Clinical - Red Word Triage >> Mar 28, 2024  1:30 PM Delon HERO wrote: Red Word that prompted transfer to Nurse Triage: Patient is calling to report that patient is calling still wheezing and feels like he can't breathe at times.. Patient mother has low heart rate with bradycardia. Mother report that the patient may have that too. Reason for Disposition  Chest pain   (Exception: MILD central chest pain, present only when coughing.)  Answer Assessment - Initial Assessment Questions Patient states in the doctor's office yesterday bradycardia, HR 50. Called CAL and notified, Corean.  1. ONSET: When did the cough begin?      X 8 months.  2. SEVERITY: How bad is the cough today?      Feels like congestion in his chest that he is not able to cough up/out. He states he coughs so much that is makes his throat hurt and he feels out of breath after  a coughing spell. Denies any vomiting.  3. SPUTUM: Describe the color of your sputum (e.g., none, dry cough; clear, white, yellow, green)     Occasionally he states he can get a little bit of mucus; he states he  didn't pay any attention to the color.  4. HEMOPTYSIS: Are you coughing up any blood? If so ask: How much? (e.g., flecks, streaks, tablespoons, etc.)     No.  5. DIFFICULTY BREATHING: Are you having difficulty breathing? If Yes, ask: How bad is it? (e.g., mild, moderate, severe)      He states the SOB comes and goes, night time he states he feels more SOB and it only lasts a few seconds. Patient denies any wheezing. Patient able to answer triage questions in full sentences, with occasional coughing.  6. FEVER: Do you have a fever? If Yes, ask: What is your temperature, how was it measured, and when did it start?     No.  7. CARDIAC HISTORY: Do you have any history of heart disease? (e.g., heart attack, congestive heart failure)      No.  8. LUNG HISTORY: Do you have any history of lung disease?  (e.g., pulmonary embolus, asthma, emphysema)     Pulmonary embolus. He states he still takes his Eliquis  and has not missed any doses.  9. PE RISK FACTORS: Do you have a history of blood clots? (or: recent major surgery, recent prolonged travel, bedridden)     Yes.  10. OTHER SYMPTOMS: Do you have any other symptoms? (e.g., runny nose, wheezing, chest pain)       He states he gets chest pain here and there x1-2 weeks, intermittent at night time. He states he had a sharp chest  pain last night and 1-2 nights ago he had chest pain that lasted about 30 minutes. He states the chest pain occurs not only when he is coughing but states it comes on its own.  11. PREGNANCY: Is there any chance you are pregnant? When was your last menstrual period?       N/A.  12. TRAVEL: Have you traveled out of the country in the last month? (e.g., travel history, exposures)       No.  Protocols used: Cough - Chronic-A-AH

## 2024-04-07 ENCOUNTER — Encounter: Admitting: Physical Medicine & Rehabilitation

## 2024-04-09 ENCOUNTER — Other Ambulatory Visit: Payer: Self-pay

## 2024-04-09 DIAGNOSIS — I82532 Chronic embolism and thrombosis of left popliteal vein: Secondary | ICD-10-CM

## 2024-04-09 DIAGNOSIS — I824Y2 Acute embolism and thrombosis of unspecified deep veins of left proximal lower extremity: Secondary | ICD-10-CM

## 2024-04-10 ENCOUNTER — Other Ambulatory Visit (HOSPITAL_COMMUNITY): Payer: Self-pay

## 2024-04-10 ENCOUNTER — Inpatient Hospital Stay: Payer: BC Managed Care – PPO | Attending: Oncology

## 2024-04-10 DIAGNOSIS — R059 Cough, unspecified: Secondary | ICD-10-CM | POA: Insufficient documentation

## 2024-04-10 DIAGNOSIS — I824Y2 Acute embolism and thrombosis of unspecified deep veins of left proximal lower extremity: Secondary | ICD-10-CM | POA: Insufficient documentation

## 2024-04-10 DIAGNOSIS — R0602 Shortness of breath: Secondary | ICD-10-CM | POA: Diagnosis not present

## 2024-04-10 DIAGNOSIS — R079 Chest pain, unspecified: Secondary | ICD-10-CM | POA: Diagnosis not present

## 2024-04-10 DIAGNOSIS — Z86711 Personal history of pulmonary embolism: Secondary | ICD-10-CM | POA: Diagnosis not present

## 2024-04-10 DIAGNOSIS — I82532 Chronic embolism and thrombosis of left popliteal vein: Secondary | ICD-10-CM

## 2024-04-10 DIAGNOSIS — I82432 Acute embolism and thrombosis of left popliteal vein: Secondary | ICD-10-CM | POA: Insufficient documentation

## 2024-04-10 DIAGNOSIS — Z79899 Other long term (current) drug therapy: Secondary | ICD-10-CM | POA: Diagnosis not present

## 2024-04-10 DIAGNOSIS — R519 Headache, unspecified: Secondary | ICD-10-CM | POA: Diagnosis not present

## 2024-04-10 DIAGNOSIS — Z86718 Personal history of other venous thrombosis and embolism: Secondary | ICD-10-CM | POA: Diagnosis not present

## 2024-04-10 DIAGNOSIS — G4700 Insomnia, unspecified: Secondary | ICD-10-CM | POA: Insufficient documentation

## 2024-04-10 DIAGNOSIS — D6862 Lupus anticoagulant syndrome: Secondary | ICD-10-CM | POA: Diagnosis not present

## 2024-04-10 DIAGNOSIS — D509 Iron deficiency anemia, unspecified: Secondary | ICD-10-CM | POA: Insufficient documentation

## 2024-04-10 LAB — CBC WITH DIFFERENTIAL/PLATELET
Abs Immature Granulocytes: 0.02 K/uL (ref 0.00–0.07)
Basophils Absolute: 0.1 K/uL (ref 0.0–0.1)
Basophils Relative: 1 %
Eosinophils Absolute: 0.2 K/uL (ref 0.0–0.5)
Eosinophils Relative: 3 %
HCT: 47.2 % (ref 39.0–52.0)
Hemoglobin: 16.3 g/dL (ref 13.0–17.0)
Immature Granulocytes: 0 %
Lymphocytes Relative: 35 %
Lymphs Abs: 2.7 K/uL (ref 0.7–4.0)
MCH: 29.2 pg (ref 26.0–34.0)
MCHC: 34.5 g/dL (ref 30.0–36.0)
MCV: 84.4 fL (ref 80.0–100.0)
Monocytes Absolute: 0.5 K/uL (ref 0.1–1.0)
Monocytes Relative: 7 %
Neutro Abs: 4.1 K/uL (ref 1.7–7.7)
Neutrophils Relative %: 54 %
Platelets: 222 K/uL (ref 150–400)
RBC: 5.59 MIL/uL (ref 4.22–5.81)
RDW: 12.5 % (ref 11.5–15.5)
WBC: 7.6 K/uL (ref 4.0–10.5)
nRBC: 0 % (ref 0.0–0.2)

## 2024-04-10 LAB — D-DIMER, QUANTITATIVE: D-Dimer, Quant: 0.29 ug{FEU}/mL (ref 0.00–0.50)

## 2024-04-10 LAB — IRON AND TIBC
Iron: 93 ug/dL (ref 45–182)
Saturation Ratios: 24 % (ref 17.9–39.5)
TIBC: 390 ug/dL (ref 250–450)
UIBC: 297 ug/dL

## 2024-04-10 LAB — FERRITIN: Ferritin: 73 ng/mL (ref 24–336)

## 2024-04-17 ENCOUNTER — Inpatient Hospital Stay (HOSPITAL_BASED_OUTPATIENT_CLINIC_OR_DEPARTMENT_OTHER): Payer: BC Managed Care – PPO | Admitting: Oncology

## 2024-04-17 VITALS — BP 143/89 | HR 63 | Temp 97.8°F | Resp 16 | Wt 224.0 lb

## 2024-04-17 DIAGNOSIS — R059 Cough, unspecified: Secondary | ICD-10-CM | POA: Diagnosis not present

## 2024-04-17 DIAGNOSIS — R519 Headache, unspecified: Secondary | ICD-10-CM | POA: Diagnosis not present

## 2024-04-17 DIAGNOSIS — I824Y2 Acute embolism and thrombosis of unspecified deep veins of left proximal lower extremity: Secondary | ICD-10-CM | POA: Diagnosis not present

## 2024-04-17 DIAGNOSIS — D509 Iron deficiency anemia, unspecified: Secondary | ICD-10-CM | POA: Diagnosis not present

## 2024-04-17 DIAGNOSIS — R051 Acute cough: Secondary | ICD-10-CM | POA: Diagnosis not present

## 2024-04-17 DIAGNOSIS — Z79899 Other long term (current) drug therapy: Secondary | ICD-10-CM | POA: Diagnosis not present

## 2024-04-17 DIAGNOSIS — D508 Other iron deficiency anemias: Secondary | ICD-10-CM | POA: Diagnosis not present

## 2024-04-17 DIAGNOSIS — D6862 Lupus anticoagulant syndrome: Secondary | ICD-10-CM | POA: Diagnosis not present

## 2024-04-17 DIAGNOSIS — G4700 Insomnia, unspecified: Secondary | ICD-10-CM | POA: Diagnosis not present

## 2024-04-17 DIAGNOSIS — I82432 Acute embolism and thrombosis of left popliteal vein: Secondary | ICD-10-CM | POA: Diagnosis not present

## 2024-04-17 DIAGNOSIS — Z86718 Personal history of other venous thrombosis and embolism: Secondary | ICD-10-CM | POA: Diagnosis not present

## 2024-04-17 DIAGNOSIS — Z86711 Personal history of pulmonary embolism: Secondary | ICD-10-CM | POA: Diagnosis not present

## 2024-04-17 DIAGNOSIS — R079 Chest pain, unspecified: Secondary | ICD-10-CM | POA: Diagnosis not present

## 2024-04-17 DIAGNOSIS — R0602 Shortness of breath: Secondary | ICD-10-CM | POA: Diagnosis not present

## 2024-04-17 MED ORDER — FLUTICASONE PROPIONATE 50 MCG/ACT NA SUSP: 2.0000 | Freq: Every day | NASAL | 2 refills | Status: DC

## 2024-04-17 MED ORDER — PREDNISONE 20 MG PO TABS: 40.0000 mg | ORAL_TABLET | Freq: Every day | ORAL | 0 refills | Status: DC

## 2024-04-17 MED ORDER — LORATADINE 10 MG PO TABS: 10.0000 mg | ORAL_TABLET | Freq: Every day | ORAL | 0 refills | Status: DC

## 2024-04-17 NOTE — Assessment & Plan Note (Addendum)
-   No current symptoms of DVT/PE.   -Most recent d-dimer is WNL. 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 -Discussed with patient and reviewed notes from Dr. Marea who recommends he continue Eliquis  5 mg twice daily indefinitely.  We discussed prophylactic dosing (2.5 mg BID) in the future should Eliquis  become a problem. -Continue Eliquis  5 mg twice daily and return to clinic in 1 year for follow-up. -No additional ultrasounds needed unless warranted.

## 2024-04-17 NOTE — Assessment & Plan Note (Signed)
-   No current symptoms of DVT/PE.   -D-dimer from 04/12/23 is 0.42.  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 -Discussed with patient and reviewed notes from Dr. Marea who recommends he continue Eliquis  5 mg twice daily indefinitely.  We discussed prophylactic dosing (2.5 mg BID) in the future should Eliquis  become a problem. - PLAN:  -Continue Eliquis  5 mg twice daily and return to clinic in 1 year for follow-up. -No additional ultrasounds needed unless warranted.
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
## 2024-04-17 NOTE — Assessment & Plan Note (Addendum)
-   Iron panel (04/10/24) showed ferritin 73, iron saturation 24%, and TIBC 390. - Denies rectal bleeding or melena - Never took any iron supplement - Iron levels have been improving without iron supplementation.  Continue iron rich diet and consider daily multivitamin. Reports he quit drinking about 2 years ago which is thinks may have helped his labs.

## 2024-04-17 NOTE — Assessment & Plan Note (Addendum)
 2-3 months.  Thinks its in his throat not lungs Worse at bedtime Chest xray (03/24/24) neg Nothing otc. We discussed otc flonase  and claritin .  Can try 40 mg prednisone  if no improvement.  If still no improvement, would recommend ENT referral.

## 2024-04-30 ENCOUNTER — Telehealth: Payer: Self-pay | Admitting: *Deleted

## 2024-04-30 NOTE — Telephone Encounter (Signed)
 I actually spoke with him he sent forms to be filled out

## 2024-04-30 NOTE — Telephone Encounter (Unsigned)
 Copied from CRM [id number]. Topic: General - Call Back - No Documentation >> Apr 29, 2024 12:53 PM Precious C wrote: Reason for CRM: Dr. Karen called in regarding a patient. I reached out to [facility] for transfer to the clinic, but the clinic did not answer. By the time I retrieved the call, the caller had already hung up. Requesting clinic follow-up. Callback number: [phone]

## 2024-05-05 ENCOUNTER — Other Ambulatory Visit: Payer: Self-pay | Admitting: Family Medicine

## 2024-05-06 ENCOUNTER — Other Ambulatory Visit (HOSPITAL_COMMUNITY): Payer: Self-pay

## 2024-05-06 DIAGNOSIS — S98912S Complete traumatic amputation of left foot, level unspecified, sequela: Secondary | ICD-10-CM | POA: Diagnosis not present

## 2024-05-06 DIAGNOSIS — S82202N Unspecified fracture of shaft of left tibia, subsequent encounter for open fracture type IIIA, IIIB, or IIIC with nonunion: Secondary | ICD-10-CM | POA: Diagnosis not present

## 2024-05-06 DIAGNOSIS — M869 Osteomyelitis, unspecified: Secondary | ICD-10-CM | POA: Diagnosis not present

## 2024-05-06 DIAGNOSIS — S52022K Displaced fracture of olecranon process without intraarticular extension of left ulna, subsequent encounter for closed fracture with nonunion: Secondary | ICD-10-CM | POA: Diagnosis not present

## 2024-05-06 MED ORDER — APIXABAN 5 MG PO TABS
5.0000 mg | ORAL_TABLET | Freq: Two times a day (BID) | ORAL | 5 refills | Status: DC
  Filled 2024-05-06: qty 60, 30d supply, fill #0

## 2024-05-13 ENCOUNTER — Other Ambulatory Visit: Payer: Self-pay | Admitting: Family Medicine

## 2024-05-13 ENCOUNTER — Other Ambulatory Visit (HOSPITAL_COMMUNITY): Payer: Self-pay

## 2024-05-13 MED ORDER — APIXABAN 5 MG PO TABS
5.0000 mg | ORAL_TABLET | Freq: Two times a day (BID) | ORAL | 5 refills | Status: AC
  Filled 2024-06-09: qty 60, 30d supply, fill #0
  Filled 2024-07-08 – 2024-07-12 (×2): qty 60, 30d supply, fill #1
  Filled 2024-08-11: qty 60, 30d supply, fill #2
  Filled 2024-09-06: qty 60, 30d supply, fill #3

## 2024-05-13 NOTE — Telephone Encounter (Unsigned)
 Copied from CRM [id number]. Topic: Clinical - Medication Refill >> May 13, 2024  8:35 AM Montie POUR wrote: Medication: apixaban  (ELIQUIS ) 5 MG TABS tablet  Has the patient contacted their pharmacy? Yes (Agent: If no, request that the patient contact the pharmacy for the refill. If patient does not wish to contact the pharmacy document the reason why and proceed with request.) (Agent: If yes, when and what did the pharmacy advise?) Pharmacy needs order to refill  This is the patient's preferred pharmacy:  [facility] [place] [facility] [address] [place] [address] Phone: [phone] Fax: [phone] Is this the correct pharmacy for this prescription? Yes If no, delete pharmacy and type the correct one.   Has the prescription been filled recently? No  Is the patient out of the medication? Yes  Has the patient been seen for an appointment in the last year OR does the patient have an upcoming appointment? Yes  Can we respond through MyChart? Yes  Agent: Please be advised that Rx refills may take up to 3 business days. We ask that you follow-up with your pharmacy.

## 2024-05-14 ENCOUNTER — Telehealth: Payer: Self-pay

## 2024-05-14 NOTE — Telephone Encounter (Signed)
 He would need to have a specific office visit Please keep up with this form He may have a same-day slot with myself somewhere in the next couple weeks

## 2024-05-14 NOTE — Telephone Encounter (Signed)
 Disability from [facility] placed in red folder in Dr Glendia mail box to be completed

## 2024-05-15 ENCOUNTER — Encounter: Payer: Self-pay | Admitting: *Deleted

## 2024-05-15 NOTE — Telephone Encounter (Signed)
 Appointment scheduled.

## 2024-05-22 ENCOUNTER — Ambulatory Visit (INDEPENDENT_AMBULATORY_CARE_PROVIDER_SITE_OTHER): Admitting: Family Medicine

## 2024-05-22 VITALS — BP 120/79 | HR 54 | Wt 224.1 lb

## 2024-05-22 DIAGNOSIS — G90522 Complex regional pain syndrome I of left lower limb: Secondary | ICD-10-CM

## 2024-05-22 DIAGNOSIS — M25522 Pain in left elbow: Secondary | ICD-10-CM

## 2024-05-22 DIAGNOSIS — M792 Neuralgia and neuritis, unspecified: Secondary | ICD-10-CM | POA: Diagnosis not present

## 2024-05-22 DIAGNOSIS — S98922S Partial traumatic amputation of left foot, level unspecified, sequela: Secondary | ICD-10-CM | POA: Diagnosis not present

## 2024-05-22 NOTE — Progress Notes (Addendum)
 Subjective:    Patient ID: [name], male    DOB: 07/16/1991, 33 y.o.   MRN: [id number]  HPI Patient presents today for follow-up Has significant pain in the left lower leg with a burning aching discomfort ever since his severe injury several years ago with partial amputation of the foot He wears appropriate brace [facility] is working toward getting him a newer brace He can only stand for 25 to 30 minutes at a time without having to sit for several minutes He is unable to walk more than a  100 yards without significant pain causing him to sit He states he has to use a scooter when he goes to the grocery store He states he is unable to go anywhere with his family because walking causes so much difficulty He also states at the same time of his foot amputation he fractured his elbow and he refractured it last year he has limited with his range of motion of the left arm and unable to do any heavy lifting unable to do repetitive actions Currently he does minimal custodial type activities around the house but is unable to do significant lifting pushing or pulling with both arms because of his left arm disability. 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 He states his moods overall are doing well He would like to be able to go back to work but he has been a unable to do so Patient also in the past was on opioids but no longer wants to be on those and has not been on those for several years He avoids drinking alcohol  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  yeah  Review of Systems     Objective:   Physical  Exam  Lungs clear respiratory rate normal Heart regular no murmurs Extremity he has multiple scars on the left lower leg as well as a amputation of the half of his foot He also has evidence of scars in the left elbow with abnormal appearance unable to extend more than 105 degrees but is able to flex 45 to 50 degrees To monofilament testing he can perceive light touch in his lower extremity as well as his hands Patient has reasonable strength of the left hand with some diminishing grip compared to the right 4/5 Patient has decreased plantarflexion and extension due to the partial amputation Patient has evidence of thinning of the fat pad of his foot with some callus     Assessment & Plan:  1. Left elbow pain (Primary) He is significantly limited in his ability to lift push pull or do repetitive motions with the left arm  2. Neuropathic pain He has significant neuropathic pain currently he manages best he can without being on any prescription medicines please see above occasionally uses OTC and says  3. Partial traumatic amputation of left foot, sequela Is working with [facility] to get a newer brace Patient is disabled with this unable to stand for any significant length of time  4. 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  Due to significant problems with a complex regional pain syndrome as well as restricted range of motion in the left arm as well as inability to stand any significant length of time due to the partial amputation.  To the patient's credit he does not want to be on medications such as Cymbalta opioids Lyrica because he is concerned it could cause cognitive interference.  He copes with it as best he can.  It  is unlikely the patient will ever improve to where he could resume those jobs.  He cannot do physical labor.  Cannot do work that requires a lot of walking.  Because of his reoccurring pain syndrome it would be extremely difficult for him to do a day job sitting for hours and it would be difficult to find a job that would accommodate his needs to frequently change positions.  Patient states if he stands too long he has severe pain and discomfort and has to sit he also relates that he sitting for a period of time he has to get up to move and sometimes he will have to lay down because of his discomfort.  He does not feel that he is able to do a standard 40-hour week job because of the level of discomfort and how it affects him.  Because of his limitations in mobility I have recommended for him to utilize a scooter when he goes to stores or goes on any type of trips with his family.

## 2024-06-02 ENCOUNTER — Encounter: Attending: Physical Medicine & Rehabilitation | Admitting: Physical Medicine & Rehabilitation

## 2024-06-05 ENCOUNTER — Encounter: Payer: Self-pay | Admitting: Family Medicine

## 2024-06-09 ENCOUNTER — Other Ambulatory Visit (HOSPITAL_COMMUNITY): Payer: Self-pay

## 2024-07-02 ENCOUNTER — Ambulatory Visit: Payer: Self-pay

## 2024-07-02 NOTE — Telephone Encounter (Signed)
 FYI Only or Action Required?: FYI only for provider: appointment scheduled on 11/17.  Patient was last seen in primary care on 05/22/2024 by Alphonsa Glendia LABOR, MD.  Called Nurse Triage reporting Back Pain.  Symptoms began several days ago.  Interventions attempted: Rest, hydration, or home remedies.  Symptoms are: gradually worsening.  Triage Disposition: See PCP When Office is Open (Within 3 Days)  Patient/caregiver understands and will follow disposition?: Yes           Copied from CRM [id number]. Topic: Clinical - Red Word Triage >> Jul 02, 2024  4:02 PM Avram MATSU wrote: Red Word that prompted transfer to Nurse Triage: lower back pain Reason for Disposition  [1] MODERATE back pain (e.g., interferes with normal activities) AND [2] present > 3 days  Answer Assessment - Initial Assessment Questions 1. ONSET: When did the pain begin? (e.g., minutes, hours, days)      X 3 days   2. LOCATION: Where does it hurt? (upper, mid or lower back)     Lower back pain  3. SEVERITY: How bad is the pain?  (e.g., Scale 1-10; mild, moderate, or severe)     6/10  4. PATTERN: Is the pain constant? (e.g., yes, no; constant, intermittent)       Intermittent, pain worsens with movement   5. RADIATION: Does the pain shoot into your legs or somewhere else?     Right leg  6. CAUSE:  What do you think is causing the back pain?      Unsure   7. BACK OVERUSE:  Any recent lifting of heavy objects, strenuous work or exercise?     No   8. MEDICINES: What have you taken so far for the pain? (e.g., nothing, acetaminophen , NSAIDS)     Nothing OTC   9. NEUROLOGIC SYMPTOMS: Do you have any weakness, numbness, or problems with bowel/bladder control?     No   10. OTHER SYMPTOMS: Do you have any other symptoms? (e.g., fever, abdomen pain, burning with urination, blood in urine)   No other symptoms noted.   Appointment scheduled for evaluation on 11/17, as patient cannot come  in on originally offered appts. On 11/14 . Patient agrees with plan of care, and will call back if anything changes, or if symptoms worsen.  Protocols used: Back Pain-A-AH

## 2024-07-07 ENCOUNTER — Ambulatory Visit: Payer: Self-pay | Admitting: Family Medicine

## 2024-07-12 ENCOUNTER — Other Ambulatory Visit (HOSPITAL_COMMUNITY): Payer: Self-pay

## 2024-07-14 ENCOUNTER — Other Ambulatory Visit (HOSPITAL_COMMUNITY): Payer: Self-pay

## 2024-07-25 ENCOUNTER — Ambulatory Visit: Payer: Self-pay

## 2024-07-25 NOTE — Telephone Encounter (Signed)
 FYI Only or Action Required?: Action required by provider: clinical question for provider and update on patient condition.  Patient was last seen in primary care on 05/22/2024 by Alphonsa Glendia LABOR, MD.  Called Nurse Triage reporting Skin Problem.  Symptoms began yesterday.  Interventions attempted: Rest, hydration, or home remedies.  Symptoms are: unchanged.  Triage Disposition: See Physician Within 24 Hours  Patient/caregiver understands and will follow disposition?: No, wishes to speak with PCP  Copied from CRM #[id number]. Topic: Clinical - Red Word Triage >> Jul 25, 2024  8:28 AM Harlene ORN wrote: Red Word that prompted transfer to Nurse Triage: boil on left thigh near genital area, causing pain and discomfort. Been there yesterday. Reason for Disposition  [1] Spreading redness around the boil AND [2] no fever  Answer Assessment - Initial Assessment Questions Patient endorses possible boil to his left thigh close to his groin. Patient reports a hx of boils-noticed the area yesterday. Pain is 5 out of 10. Patient is asking for a call back from the office.   1. APPEARANCE of BOIL: What does the boil look like?      Redness to the area 2. LOCATION: Where is the boil located?      Located on left thigh near groin. Close to the crease where the leg and hip meets  3. NUMBER: How many boils are there?      1 4. SIZE: How big is the boil? (e.g., inches, cm; compare to size of a coin or other object)     Size of a dime 5. ONSET: When did the boil start?     Noticed it yesterday-patient states he has a history of boils 6. PAIN: Is there any pain? If Yes, ask: How bad is the pain?   (Scale 1-10; or mild, moderate, severe)     5 out of 10 7. FEVER: Do you have a fever? If Yes, ask: What is it, how was it measured, and when did it start?      no 8. SOURCE: Have you been around anyone with boils or other Staph infections? Have you ever had boils before?     Hx of boils 9.  OTHER SYMPTOMS: Do you have any other symptoms? (e.g., shaking chills, weakness, rash elsewhere on body)     no  Protocols used: Boil (Skin Abscess)-A-AH

## 2024-07-28 ENCOUNTER — Ambulatory Visit: Payer: Self-pay

## 2024-07-28 NOTE — Telephone Encounter (Signed)
 Called and left a message for the patient to call and or message regarding his skin concern , appt may be required.

## 2024-07-28 NOTE — Telephone Encounter (Signed)
 FYI Only or Action Required?: FYI only for provider: appointment scheduled on 07/29/2024.  Patient was last seen in primary care on 05/22/2024 by Alphonsa Glendia LABOR, MD.  Called Nurse Triage reporting Mass.  Symptoms began several days ago.  Interventions attempted: Nothing.  Symptoms are: unchanged.  Triage Disposition: See PCP When Office is Open (Within 3 Days)  Patient/caregiver understands and will follow disposition?: Yes  Copied from CRM [id number]. Topic: Clinical - Red Word Triage >> Jul 28, 2024  3:20 PM Eva FALCON wrote: Red Word that prompted transfer to Nurse Triage: bump in groin area, painful and swollen. Reason for Disposition  Boil > 1/2 inch across (> 12 mm; larger than a marble)  Answer Assessment - Initial Assessment Questions 1. APPEARANCE of BOIL: What does the boil look like?      Round, red and purple at top of skin. firm 2. LOCATION: Where is the boil located?      Left groin 3. NUMBER: How many boils are there?      one 4. SIZE: How big is the boil? (e.g., inches, cm; compare to size of a coin or other object)     Dime sized on the top 5. ONSET: When did the boil start?     About four days ago 6. PAIN: Is there any pain? If Yes, ask: How bad is the pain?   (Scale 1-10; or mild, moderate, severe)     Tender to palpation 7. FEVER: Do you have a fever? If Yes, ask: What is it, how was it measured, and when did it start?      denies  Protocols used: Boil (Skin Abscess)-A-AH

## 2024-07-29 ENCOUNTER — Ambulatory Visit: Admitting: Family Medicine

## 2024-07-29 VITALS — BP 112/76 | HR 53 | Temp 98.6°F | Ht 72.0 in | Wt 234.4 lb

## 2024-07-29 DIAGNOSIS — L0291 Cutaneous abscess, unspecified: Secondary | ICD-10-CM | POA: Insufficient documentation

## 2024-07-29 MED ORDER — DOXYCYCLINE HYCLATE 100 MG PO TABS: 100.0000 mg | ORAL_TABLET | Freq: Two times a day (BID) | ORAL | 0 refills | Status: AC

## 2024-07-29 MED ORDER — MUPIROCIN 2 % EX OINT: 1.0000 | TOPICAL_OINTMENT | Freq: Three times a day (TID) | CUTANEOUS | 0 refills | Status: AC

## 2024-07-29 NOTE — Progress Notes (Signed)
 Subjective:  Patient ID: [name], male    DOB: 02-03-1991  Age: 33 y.o. MRN: [id number]  CC:   Chief Complaint  Patient presents with   Recurrent Skin Infections    Patient is here for having a boil in the left groin that appeared last Thursday. Has had one before     HPI:  33 year old male presents for evaluation of the above.  Patient reports that he has an area of concern to the left side of the groin area.  It started last Thursday.  It has been painful.  No drainage.  Surrounding redness.  It has improved but has not resolved.  He has a history of abscess.   Social Hx   Social History   Socioeconomic History   Marital status: Single    Spouse name: Not on file   Number of children: Not on file   Years of education: Not on file   Highest education level: Associate degree: occupational, scientist, product/process development, or vocational program  Occupational History   Not on file  Tobacco Use   Smoking status: Former    Types: Cigarettes   Smokeless tobacco: Current    Types: Snuff   Tobacco comments:    chews a can per day, smokes 3 cigarettes  Vaping Use   Vaping status: Never Used  Substance and Sexual Activity   Alcohol  use: Yes    Alcohol /week: 6.0 standard drinks of alcohol     Types: 6 Cans of beer per week   Drug use: Not Currently   Sexual activity: Yes    Birth control/protection: None  Other Topics Concern   Not on file  Social History Narrative   Not on file   Social Drivers of Health   Financial Resource Strain: Low Risk  (07/29/2024)   Overall Financial Resource Strain (CARDIA)    Difficulty of Paying Living Expenses: Not very hard  Food Insecurity: No Food Insecurity (07/29/2024)   Hunger Vital Sign    Worried About Running Out of Food in the Last Year: Never true    Ran Out of Food in the Last Year: Never true  Transportation Needs: No Transportation Needs (07/29/2024)   PRAPARE - Administrator, Civil Service (Medical): No    Lack of  Transportation (Non-Medical): No  Physical Activity: Insufficiently Active (07/29/2024)   Exercise Vital Sign    Days of Exercise per Week: 1 day    Minutes of Exercise per Session: 10 min  Stress: Stress Concern Present (07/29/2024)   [employer] of Occupational Health - Occupational Stress Questionnaire    Feeling of Stress: To some extent  Social Connections: Moderately Integrated (07/29/2024)   Social Connection and Isolation Panel    Frequency of Communication with Friends and Family: More than three times a week    Frequency of Social Gatherings with Friends and Family: Twice a week    Attends Religious Services: More than 4 times per year    Active Member of [employer] or Organizations: No    Attends Engineer, Structural: Not on file    Marital Status: Married    Review of Systems Per HPI  Objective:  BP 112/76 (BP Location: Right Arm, Patient Position: Sitting)   Pulse (!) 53   Temp 98.6 F (37 C)   Ht 6' (1.829 m)   Wt 234 lb 6 oz (106.3 kg)   SpO2 97%   BMI 31.79 kg/m      07/29/2024   10:36  AM 05/22/2024   10:24 AM 04/17/2024    2:25 PM  BP/Weight  Systolic BP 112 120 143  Diastolic BP 76 79 89  Wt. (Lbs) 234.38 224.12 223.99  BMI 31.79 kg/m2 30.4 kg/m2 30.38 kg/m2    Physical Exam Constitutional:      General: He is not in acute distress.    Appearance: Normal appearance.  HENT:     Head: Normocephalic and atraumatic.  Pulmonary:     Effort: Pulmonary effort is normal. No respiratory distress.  Genitourinary:      Comments: Small firm area with mild erythema. Neurological:     Mental Status: He is alert.     Lab Results  Component Value Date   WBC 7.6 04/10/2024   HGB 16.3 04/10/2024   HCT 47.2 04/10/2024   PLT 222 04/10/2024   GLUCOSE 77 11/02/2023   CHOL 178 07/13/2014   TRIG 71 07/13/2014   HDL 71 07/13/2014   LDLCALC 93 07/13/2014   ALT 18 11/02/2023   AST 20 11/02/2023   NA 138 11/02/2023   K 3.9 11/02/2023   CL 102  11/02/2023   CREATININE 1.05 11/02/2023   BUN 12 11/02/2023   CO2 25 11/02/2023   TSH 0.558 11/01/2023   INR 0.95 04/25/2014     Assessment & Plan:  Abscess Assessment & Plan: Area is slightly indurated.  No fluctuance.  Treating with doxycycline  and Bactroban .  Supportive care.  Orders: -     Doxycycline  Hyclate; Take 1 tablet (100 mg total) by mouth 2 (two) times daily.  Dispense: 20 tablet; Refill: 0 -     Mupirocin ; Apply 1 Application topically 3 (three) times daily for 7 days.  Dispense: 30 g; Refill: 0    Follow-up:  Return if symptoms worsen or fail to improve.  Jacqulyn Ahle DO [facility]

## 2024-07-29 NOTE — Patient Instructions (Signed)
 Medications as prescribed. Take antibiotic with food.  Take care  Dr. Bluford

## 2024-07-29 NOTE — Assessment & Plan Note (Signed)
 Area is slightly indurated.  No fluctuance.  Treating with doxycycline  and Bactroban .  Supportive care.

## 2024-08-01 DIAGNOSIS — Z419 Encounter for procedure for purposes other than remedying health state, unspecified: Secondary | ICD-10-CM | POA: Diagnosis not present

## 2024-08-11 ENCOUNTER — Other Ambulatory Visit (HOSPITAL_COMMUNITY): Payer: Self-pay

## 2024-09-06 ENCOUNTER — Other Ambulatory Visit (HOSPITAL_COMMUNITY): Payer: Self-pay

## 2024-09-26 ENCOUNTER — Ambulatory Visit: Payer: Self-pay

## 2024-09-26 ENCOUNTER — Ambulatory Visit: Admitting: Family Medicine

## 2024-09-26 VITALS — BP 120/86 | HR 69 | Temp 98.6°F | Ht 72.0 in | Wt 238.0 lb

## 2024-09-26 DIAGNOSIS — J020 Streptococcal pharyngitis: Secondary | ICD-10-CM

## 2024-09-26 DIAGNOSIS — J029 Acute pharyngitis, unspecified: Secondary | ICD-10-CM

## 2024-09-26 MED ORDER — AZITHROMYCIN 250 MG PO TABS: ORAL_TABLET | ORAL | 0 refills | Status: AC

## 2024-09-26 NOTE — Telephone Encounter (Signed)
 Patient has an appointment 2/6/ @ 3:10

## 2024-09-26 NOTE — Telephone Encounter (Signed)
 FYI Only or Action Required?: Action required by provider: request for appointment, clinical question for provider, and update on patient condition.  Patient was last seen in primary care on 07/29/2024 by Cook, Jayce G, DO.  Called Nurse Triage reporting Cough.  Symptoms began several days ago.  Interventions attempted: Rest, hydration, or home remedies.  Symptoms are: gradually worsening.  Triage Disposition: Go to ED Now (or PCP Triage)  Patient/caregiver understands and will follow disposition?: No, refuses disposition      Copied from CRM [id number]. Topic: Clinical - Medical Advice >> Sep 26, 2024  8:39 AM Victoria B wrote: Patient's mother called in,patient sore throat and cough, states gotten worse Reason for Disposition  Patient sounds very sick or weak to the triager  Answer Assessment - Initial Assessment Questions This RN recommended pt be examined in hospital, pt refusing. Advised pt call 911 or get to hospital asap if any new or worsening symptoms. Sending message to PCP office for call back to pt with further recommendations. Alerted [facility] to ED refusal.      Mom picked up pt phone, pt in background  1. ONSET: When did the cough begin?      Tuesday  2. SEVERITY: How bad is the cough today?      Always coughing, coughing up sputum but not looked to see color  3. SPUTUM: Describe the color of your sputum (e.g., none, dry cough; clear, white, yellow, green)     Unable to assess  4. HEMOPTYSIS: Are you coughing up any blood? If Yes, ask: How much? (e.g., flecks, streaks, tablespoons, etc.)     Unable to assess  5. DIFFICULTY BREATHING: Are you having difficulty breathing? If Yes, ask: How bad is it? (e.g., mild, moderate, severe)      Denies, snoring  6. FEVER: Do you have a fever? If Yes, ask: What is your temperature, how was it measured, and when did it start?     Not taken temp, seemed warm the other day  7. CARDIAC HISTORY: Do you have  any history of heart disease? (e.g., heart attack, congestive heart failure)      DVTs  8. LUNG HISTORY: Do you have any history of lung disease?  (e.g., pulmonary embolus, asthma, emphysema)     PE  9. PE RISK FACTORS: Do you have a history of blood clots? (or: recent major surgery, recent prolonged travel, bedridden)     Significant, still taking eliquis   10. OTHER SYMPTOMS: Do you have any other symptoms? (e.g., runny nose, wheezing, chest pain)       Congestion Throat feels sore and swollen, drooling from it  Denies: SOB Coughing up blood Abnormal breath sounds Chest pain Heart racing Dizziness Weakness Exposure to anyone outside the home recently Sick family members  Protocols used: Cough - Acute Productive-A-AH

## 2024-09-26 NOTE — Progress Notes (Unsigned)
" ° °  Subjective:    Patient ID: [name], male    DOB: May 04, 1991, 34 y.o.   MRN: [id number]  HPI  Room 10  Pt is here for a sick visit  Pt states symptoms started Tuesday  Symptoms are: cough. Congestion, sore throat, and nausea   Review of Systems     Objective:   Physical Exam General-in no acute distress Eyes-no discharge Lungs-respiratory rate normal, CTA CV-no murmurs,RRR Extremities skin warm dry no edema Neuro grossly normal Behavior normal, alert Throat erythematous no abscess noted       Assessment & Plan:  Viral syndrome Will take several days for it to improve No sign of pneumonia  Sore throat unable to get a reliable strep test recommend azithromycin   "

## 2025-04-15 ENCOUNTER — Other Ambulatory Visit

## 2025-04-22 ENCOUNTER — Ambulatory Visit: Admitting: Physician Assistant
